# Patient Record
Sex: Male | Born: 1990 | Race: Black or African American | Hispanic: No | Marital: Single | Smoking: Never smoker
Health system: Southern US, Community
[De-identification: ages and names within clinical notes are randomized; demographics above are authoritative.]

## PROBLEM LIST (undated history)

## (undated) DIAGNOSIS — Z8669 Personal history of other diseases of the nervous system and sense organs: Secondary | ICD-10-CM

## (undated) DIAGNOSIS — F319 Bipolar disorder, unspecified: Secondary | ICD-10-CM

---
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
## 2016-03-30 ENCOUNTER — Emergency Department (HOSPITAL_COMMUNITY)
Admission: EM | Admit: 2016-03-30 | Discharge: 2016-04-03 | Disposition: A | Discharge status: Home / Self Care | Payer: 59 | Attending: Emergency Medicine | Admitting: Emergency Medicine

## 2016-03-30 ENCOUNTER — Encounter (HOSPITAL_COMMUNITY): Payer: Self-pay | Admitting: Emergency Medicine

## 2016-03-30 DIAGNOSIS — F061 Catatonic disorder due to known physiological condition: Secondary | ICD-10-CM | POA: Diagnosis present

## 2016-03-30 DIAGNOSIS — F3162 Bipolar disorder, current episode mixed, moderate: Secondary | ICD-10-CM | POA: Diagnosis not present

## 2016-03-30 DIAGNOSIS — F309 Manic episode, unspecified: Secondary | ICD-10-CM | POA: Diagnosis present

## 2016-03-30 DIAGNOSIS — F3160 Bipolar disorder, current episode mixed, unspecified: Secondary | ICD-10-CM | POA: Diagnosis present

## 2016-03-30 DIAGNOSIS — Z79899 Other long term (current) drug therapy: Secondary | ICD-10-CM | POA: Insufficient documentation

## 2016-03-30 DIAGNOSIS — F3113 Bipolar disorder, current episode manic without psychotic features, severe: Secondary | ICD-10-CM | POA: Diagnosis present

## 2016-03-30 DIAGNOSIS — Z888 Allergy status to other drugs, medicaments and biological substances status: Secondary | ICD-10-CM | POA: Diagnosis not present

## 2016-03-30 DIAGNOSIS — F29 Unspecified psychosis not due to a substance or known physiological condition: Secondary | ICD-10-CM | POA: Diagnosis not present

## 2016-03-30 HISTORY — DX: Bipolar disorder, unspecified: F31.9

## 2016-03-30 LAB — CBC
HEMATOCRIT: 43 % (ref 39.0–52.0)
HEMOGLOBIN: 14.7 g/dL (ref 13.0–17.0)
MCH: 26.3 pg (ref 26.0–34.0)
MCHC: 34.2 g/dL (ref 30.0–36.0)
MCV: 77.1 fL — AB (ref 78.0–100.0)
Platelets: 263 10*3/uL (ref 150–400)
RBC: 5.58 MIL/uL (ref 4.22–5.81)
RDW: 13 % (ref 11.5–15.5)
WBC: 14.1 10*3/uL — AB (ref 4.0–10.5)

## 2016-03-30 NOTE — ED Triage Notes (Signed)
Pt from home via police escort. Mother called [employer] for son's manic behavior that has increased over last several weeks.  Pt states that he hasn't been taking his meds like he should (lithium).  Denies any recent changes or stressors.  Pt denies SI/HI/AVH.  Hx bipolar d/o.

## 2016-03-30 NOTE — ED Notes (Signed)
Bed: [facility] Expected date:  Expected time:  Means of arrival:  Comments: 

## 2016-03-31 LAB — RAPID URINE DRUG SCREEN, HOSP PERFORMED
AMPHETAMINES: NOT DETECTED
BENZODIAZEPINES: NOT DETECTED
Barbiturates: NOT DETECTED
Cocaine: NOT DETECTED
Opiates: NOT DETECTED
TETRAHYDROCANNABINOL: NOT DETECTED

## 2016-03-31 LAB — COMPREHENSIVE METABOLIC PANEL
ALBUMIN: 4.8 g/dL (ref 3.5–5.0)
ALK PHOS: 61 U/L (ref 38–126)
ALT: 76 U/L — AB (ref 17–63)
AST: 49 U/L — ABNORMAL HIGH (ref 15–41)
Anion gap: 7 (ref 5–15)
BUN: 12 mg/dL (ref 6–20)
CALCIUM: 10 mg/dL (ref 8.9–10.3)
CO2: 26 mmol/L (ref 22–32)
CREATININE: 1.34 mg/dL — AB (ref 0.61–1.24)
Chloride: 101 mmol/L (ref 101–111)
GFR calc non Af Amer: >60 mL/min (ref >60)
GLUCOSE: 124 mg/dL — AB (ref 65–99)
Potassium: 3.5 mmol/L (ref 3.5–5.1)
SODIUM: 134 mmol/L — AB (ref 135–145)
Total Bilirubin: 0.7 mg/dL (ref 0.3–1.2)
Total Protein: 8.6 g/dL — ABNORMAL HIGH (ref 6.5–8.1)

## 2016-03-31 LAB — ETHANOL: Alcohol, Ethyl (B): <5 mg/dL (ref <5)

## 2016-03-31 LAB — SALICYLATE LEVEL: Salicylate Lvl: <7 mg/dL (ref 2.8–30.0)

## 2016-03-31 LAB — ACETAMINOPHEN LEVEL: Acetaminophen (Tylenol), Serum: <10 ug/mL — ABNORMAL LOW (ref 10–30)

## 2016-03-31 LAB — LITHIUM LEVEL: LITHIUM LVL: 0.88 mmol/L (ref 0.60–1.20)

## 2016-03-31 MED ORDER — DIPHENHYDRAMINE HCL 25 MG PO CAPS
50.0000 mg | ORAL_CAPSULE | Freq: Three times a day (TID) | ORAL | Status: DC | PRN
  Administered 2016-03-31: 50 mg via ORAL
  Filled 2016-03-31: qty 2

## 2016-03-31 MED ORDER — LORAZEPAM 1 MG PO TABS
2.0000 mg | ORAL_TABLET | Freq: Four times a day (QID) | ORAL | Status: DC | PRN
  Administered 2016-03-31: 2 mg via ORAL
  Filled 2016-03-31: qty 2

## 2016-03-31 MED ORDER — LITHIUM CARBONATE 300 MG PO CAPS
300.0000 mg | ORAL_CAPSULE | Freq: Two times a day (BID) | ORAL | Status: DC
  Administered 2016-03-31 – 2016-04-01 (×2): 300 mg via ORAL
  Filled 2016-03-31 (×3): qty 1

## 2016-03-31 MED ORDER — HALOPERIDOL LACTATE 5 MG/ML IJ SOLN
10.0000 mg | Freq: Three times a day (TID) | INTRAMUSCULAR | Status: DC | PRN
Start: 2016-03-31 — End: 2016-04-01

## 2016-03-31 MED ORDER — HALOPERIDOL 5 MG PO TABS
10.0000 mg | ORAL_TABLET | Freq: Three times a day (TID) | ORAL | Status: DC | PRN
  Administered 2016-03-31: 10 mg via ORAL
  Filled 2016-03-31: qty 2

## 2016-03-31 MED ORDER — LORAZEPAM 1 MG PO TABS
1.0000 mg | ORAL_TABLET | Freq: Two times a day (BID) | ORAL | Status: DC
  Administered 2016-03-31 – 2016-04-01 (×2): 1 mg via ORAL
  Filled 2016-03-31 (×3): qty 1

## 2016-03-31 MED ORDER — DIPHENHYDRAMINE HCL 50 MG/ML IJ SOLN: 50.0000 mg | Freq: Three times a day (TID) | INTRAMUSCULAR | Status: DC | PRN

## 2016-03-31 NOTE — ED Notes (Signed)
[facility] Expected date:  Expected time:  Means of arrival:  Comments: [place]

## 2016-03-31 NOTE — ED Notes (Addendum)
Li+ level drawn by lab and sent

## 2016-03-31 NOTE — BH Assessment (Signed)
Pt's mother has requested to speak with the pt's RN in order to ask medical questions and review his allergy list. Report given to Consuella LoseElaine, RN to contact the pt's mother to discuss medical concerns. Pt's mother requests that the pt [facility]. Assessor explained the process of inpt treatment and bed availability. Pt's mother verbalized understanding and requests to be contacted with any updates in regards to the pt's care.   Princess BruinsAquicha Duff, MSW, Theresia MajorsLCSWA

## 2016-03-31 NOTE — BH Assessment (Signed)
Tele Assessment Note   Brandon Fuentes is an 26 y.o. male who presents to the ED under IVC initiated by his mother. Pt was catatonic during the assessment and did not engage with this assessor. Pt was asked direct questions including name, DOB, reasons for coming to the hospital and the pt did not respond. Pt continued to stare blankly at the assessor.    Per IVC, the pt has been experiencing hallucinations, talking to God, and telling his mother that "god is ready for them to come home." Pt's mother reports she is "afraid to live with him" due to fear that he may try to harm her. Mom reports the pt has made threats to her saying "hit me, hit me, I don't want to hit a woman but hit me and I'll bust you in the face." Pt's mother reports she was afraid today because the pt "hugged me so tight and would not let me go and he was crying and told me he was taking me home."    Mom reports she noticed these changes in the pt in the last 6 weeks and states he has not seen his psychiatrist because he missed the last several appts. Pt's mother states he is taking his medication regularly but feels the medication is no longer effective and since he has not been to the psychiatrist she is unable to get it modified. Pt's mother reports the pt is short tempered and has been fired from multiple jobs for "going off." Mom reports the pt sometimes experiences delusions while he is watching TV and will think the character in the TV is a real person or is the pt's mother.    Per Nira ConnJason Berry, NP pt meets criteria for inpt treatment. TTS to seek placement. Consuella LoseElaine, RN notified of the recommended disposition.    Diagnosis: Schizophrenia; 295.90 (F20.9)  Past Medical History:  Past Medical History:  Diagnosis Date   Bipolar 1 disorder (HCC)     History reviewed. No pertinent surgical history.  Family History: History reviewed. No pertinent family history.  Social History:  reports that he has never smoked. He has  never used smokeless tobacco. He reports that he does not drink alcohol or use drugs.  Additional Social History:  Alcohol / Drug Use Pain Medications: See PTA meds  Prescriptions: See PTA meds  Over the Counter: See PTA meds  History of alcohol / drug use?: No history of alcohol / drug abuse  CIWA: CIWA-Ar BP: 142/94 Pulse Rate: 93 COWS:    PATIENT STRENGTHS: (choose at least two) Financial means Religious Affiliation Supportive family/friends  Allergies:  Allergies  Allergen Reactions   Depakote [Divalproex Sodium] Other (See Comments)    Sleepiness, anger   Zoloft [Sertraline Hcl]     anger    Home Medications:  (Not in a hospital admission)  OB/GYN Status:  No LMP for male patient.  General Assessment Data [facility] TTS Assessment: In system Is this a Tele or Face-to-Face Assessment?: Face-to-Face Is this an Initial Assessment or a Re-assessment for this encounter?: Initial Assessment Marital status: Single Is patient pregnant?: No Pregnancy Status: No Living Arrangements: Parent Can pt return to current living arrangement?: Yes Admission Status: Involuntary Is patient capable of signing voluntary admission?: No Referral Source: Self/Family/Friend [facility]     Crisis Care Plan Living Arrangements: Parent Name of Psychiatrist: Dr. Maggie FontMarion Wright Name of Therapist: none  Education Status Is patient currently in school?: No Highest grade of school  patient has completed: 12th  Risk to self with the past 6 months Suicidal Ideation: No Has patient been a risk to self within the past 6 months prior to admission? : No Suicidal Intent: No Has patient had any suicidal intent within the past 6 months prior to admission? : No Is patient at risk for suicide?: No Suicidal Plan?: No Has patient had any suicidal plan within the past 6 months prior to admission? : No Access to Means: No What has been your use of drugs/alcohol  within the last 12 months?: denies Previous Attempts/Gestures: No Triggers for Past Attempts: None known Intentional Self Injurious Behavior: None Family Suicide History: No Recent stressful life event(s): Other (Comment) (pt has been experiencing hallucinations ) Persecutory voices/beliefs?: No Depression: No Substance abuse history and/or treatment for substance abuse?: No Suicide prevention information given to non-admitted patients: Not applicable  Risk to Others within the past 6 months Homicidal Ideation: No Does patient have any lifetime risk of violence toward others beyond the six months prior to admission? : No Thoughts of Harm to Others: No-Not Currently Present/Within Last 6 Months (mom reports pt has made verbal threats in the past) Current Homicidal Intent: No Current Homicidal Plan: No Access to Homicidal Means: No History of harm to others?: No Assessment of Violence: None Noted Does patient have access to weapons?: No Criminal Charges Pending?: No Does patient have a court date: No Is patient on probation?: No  Psychosis Hallucinations: Auditory, Visual Delusions: Unspecified  Mental Status Report Appearance/Hygiene: Bizarre, In scrubs Eye Contact: Good Motor Activity: Other (Comment) (pt presented to be still in the bed and did not move ) Speech: Unable to assess (pt did not speak ) Level of Consciousness: Alert Mood: Other (Comment) (UTA) Affect: Unable to Assess Anxiety Level:  (UTA) Thought Processes: Unable to Assess Judgement: Unable to Assess Orientation: Not oriented Obsessive Compulsive Thoughts/Behaviors: Unable to Assess  Cognitive Functioning Concentration: Unable to Assess Memory: Unable to Assess IQ: Average Insight: Unable to Assess Impulse Control: Unable to Assess Appetite:  (UTA) Sleep: Unable to Assess Vegetative Symptoms: Unable to Assess  ADLScreening [facility] Assessment Services) Patient's cognitive ability adequate to safely  complete daily activities?: Yes Patient able to express need for assistance with ADLs?: No Independently performs ADLs?: Yes (appropriate for developmental age)  Prior Inpatient Therapy Prior Inpatient Therapy: Yes Prior Therapy Dates: 2011 Prior Therapy Facilty/Provider(s): [facility] Reason for Treatment: Bipolar  Prior Outpatient Therapy Prior Outpatient Therapy: Yes Prior Therapy Dates: 2017 Prior Therapy Facilty/Provider(s): Dr. Maggie Font Reason for Treatment: Med Management  Does patient have an ACCT team?: No Does patient have Intensive In-House Services?  : No Does patient have [facility] services? : No Does patient have P4CC services?: No  ADL Screening (condition at time of admission) Patient's cognitive ability adequate to safely complete daily activities?: Yes Is the patient deaf or have difficulty hearing?: No Does the patient have difficulty seeing, even when wearing glasses/contacts?: No Does the patient have difficulty concentrating, remembering, or making decisions?: Yes Patient able to express need for assistance with ADLs?: No Does the patient have difficulty dressing or bathing?: No Independently performs ADLs?: Yes (appropriate for developmental age) Does the patient have difficulty walking or climbing stairs?: No Weakness of Legs: None Weakness of Arms/Hands: None  Home Assistive Devices/Equipment Home Assistive Devices/Equipment: Eyeglasses    Abuse/Neglect Assessment (Assessment to be complete while patient is alone) Physical Abuse: Denies Verbal Abuse: Denies Sexual Abuse: Denies Exploitation of patient/patient's resources: Denies Self-Neglect: Denies  Advance Directives (For Healthcare) Does Patient Have a Medical Advance Directive?: No Would patient like information on creating a medical advance directive?: No - Patient declined    Additional Information 1:1 In Past 12 Months?: No CIRT Risk: No Elopement Risk: No Does patient have medical  clearance?: Yes     Disposition:  Disposition Initial Assessment Completed for this Encounter: Yes Disposition of Patient: Inpatient treatment program Type of inpatient treatment program: Adult (per Nira Conn, NP )  Karolee Ohs 03/31/2016 2:17 AM

## 2016-03-31 NOTE — ED Notes (Signed)
Pt appears to be responding to internal stimuli. He goes into other patient's rooms and when redirected he becomes limp and asks as though he cannot walk. The day room was opened for him and he has been going in there as an alternative. Pt's behavior is not predictable and he stands at the nurse station window staring frequently. He will not respond verbally.

## 2016-03-31 NOTE — ED Notes (Signed)
Tasha in phlebotomy notified of AM Li+ level needed.

## 2016-03-31 NOTE — ED Notes (Addendum)
Pt urinated on floor in front of nurse station, and gave no other interaction, his scrubs are dry.

## 2016-03-31 NOTE — ED Notes (Addendum)
Pt stating "In Jesus name, in Jesus name".    Pt moves slowly and is slow to respond to questions if a response is provided.  Pt has personal Bible @ BS.  Pt changed into paper scrubs, provided non-skid footwear & wanded by Security.

## 2016-03-31 NOTE — ED Provider Notes (Signed)
[facility] DE[facility] Provider Note   CSN: [id number] Arrival date & time: 03/30/16  2201  Level V caveat psychiatric complaint. [place] [facility] [place].  I attempted to call patient's home. No answer History   Chief Complaint Chief Complaint  Patient presents with  . Manic Behavior    HPI Brandon Fuentes is a 26 y.o. male.Police were called by patient's mother who states that patient has likely been off of his psychiatric medications and has been acting "manic" for several days. Patient is speaking constantly about the Shaune PollackLord,. Mother is concerned that he is manic. Patient vehemently denies that he wants to harm himself or others. He does not answer all questions.  HPI  Past Medical History:  Diagnosis Date  . Bipolar 1 disorder (HCC)     There are no active problems to display for this patient.   History reviewed. No pertinent surgical history.     Home Medications    Prior to Admission medications   Medication Sig Start Date End Date Taking? Authorizing Provider  lithium carbonate 300 MG capsule Take 600-900 mg by mouth 2 (two) times daily. 06/10/14  Yes Historical Provider, MD    Family History History reviewed. No pertinent family history.  Social History Social History  Substance Use Topics  . Smoking status: Never Smoker  . Smokeless tobacco: Never Used  . Alcohol use No     Allergies   Patient has no known allergies.   Review of Systems Review of Systems  Unable to perform ROS: Psychiatric disorder   Patient does not answer all questions  Physical Exam Updated Vital Signs BP 143/87 (BP Location: Right Arm)   Pulse 82   Temp 98.7 F (37.1 C) (Oral)   Resp 18   SpO2 98%   Physical Exam  Constitutional: He appears well-developed and well-nourished.  HENT:  Head: Normocephalic and atraumatic.  Eyes: Conjunctivae are normal. Pupils are equal, round, and reactive to light.  Neck: Neck supple. No  tracheal deviation present. No thyromegaly present.  Cardiovascular: Normal rate.   Pulmonary/Chest: Effort normal.  Abdominal: He exhibits no distension.  Obese  Musculoskeletal: Normal range of motion. He exhibits no edema.  Neurological: He is alert. Coordination normal.  Skin: Skin is warm and dry. No rash noted.  Psychiatric: He has a normal mood and affect.  Nursing note and vitals reviewed.    ED Treatments / Results  Labs (all labs ordered are listed, but only abnormal results are displayed) Labs Reviewed  CBC - Abnormal; Notable for the following:       Result Value   WBC 14.1 (*)    MCV 77.1 (*)    All other components within normal limits  COMPREHENSIVE METABOLIC PANEL  ETHANOL  SALICYLATE LEVEL  ACETAMINOPHEN LEVEL  RAPID URINE DRUG SCREEN, HOSP PERFORMED  LITHIUM LEVEL    EKG  EKG Interpretation None       Radiology No results found.  Procedures Procedures (including critical care time)  Medications Ordered in ED Medications - No data to display  Results for orders placed or performed during the hospital encounter of 03/30/16  Comprehensive metabolic panel  Result Value Ref Range   Sodium 134 (L) 135 - 145 mmol/L   Potassium 3.5 3.5 - 5.1 mmol/L   Chloride 101 101 - 111 mmol/L   CO2 26 22 - 32 mmol/L   Glucose, Bld 124 (H) 65 - 99 mg/dL   BUN 12 6 - 20 mg/dL  Creatinine, Ser 1.34 (H) 0.61 - 1.24 mg/dL   Calcium 81.1 8.9 - 91.4 mg/dL   Total Protein 8.6 (H) 6.5 - 8.1 g/dL   Albumin 4.8 3.5 - 5.0 g/dL   AST 49 (H) 15 - 41 U/L   ALT 76 (H) 17 - 63 U/L   Alkaline Phosphatase 61 38 - 126 U/L   Total Bilirubin 0.7 0.3 - 1.2 mg/dL   GFR calc non Af Amer >60 >60 mL/min   GFR calc Af Amer >60 >60 mL/min   Anion gap 7 5 - 15  Ethanol  Result Value Ref Range   Alcohol, Ethyl (B) <5 <5 mg/dL  Salicylate level  Result Value Ref Range   Salicylate Lvl <7.0 2.8 - 30.0 mg/dL  Acetaminophen level  Result Value Ref Range   Acetaminophen (Tylenol),  Serum <10 (L) 10 - 30 ug/mL  cbc  Result Value Ref Range   WBC 14.1 (H) 4.0 - 10.5 K/uL   RBC 5.58 4.22 - 5.81 MIL/uL   Hemoglobin 14.7 13.0 - 17.0 g/dL   HCT 78.2 95.6 - 21.3 %   MCV 77.1 (L) 78.0 - 100.0 fL   MCH 26.3 26.0 - 34.0 pg   MCHC 34.2 30.0 - 36.0 g/dL   RDW 08.6 57.8 - 46.9 %   Platelets 263 150 - 400 K/uL   No results found. Initial Impression / Assessment and Plan / ED Course  I have reviewed the triage vital signs and the nursing notes.  Pertinent labs & imaging results that were available during my care of the patient were reviewed by me and considered in my medical decision making (see chart for details).     Pt signed out to Dr. Donnald Garre at 1230 am  Final Clinical Impressions(s) / ED Diagnoses  Dx bipolar disorder Final diagnoses:  None    New Prescriptions New Prescriptions   No medications on file     Doug Sou, MD 03/31/16 [phone]

## 2016-03-31 NOTE — ED Notes (Signed)
Pt took his pills pleasantly and allowed this writer to look into his mouth. Is not speaking full sentences.

## 2016-03-31 NOTE — ED Notes (Signed)
SBAR Report received from previous nurse. Pt received calm and visible on unit. Pt gave no answers to assessment questions related to current SI/ HI, A/V H, depression, anxiety, or pain at this time, and appears otherwise stable and free of distress. Pt reminded of camera surveillance, q 15 min rounds, and rules of the milieu. Pt provided cup for urin, pt urinated in room (no rest room in pt room) and on floor. Will continue to assess.

## 2016-03-31 NOTE — ED Provider Notes (Signed)
Patient initially seen and evaluated by Dr. Ethelda ChickJacubowitz for manic behavior with aggression reported by his mother who filed an IVC petition. He is answering a few questions now, denies physical complaint, and reports he is spiritually in the [place]. He does not appear to be hallucinating. He will need TTS consultation to determine disposition. Psych orders placed.   Elpidio AnisShari Aitan Rossbach, PA-C 03/31/16 0139    Doug SouSam Jacubowitz, MD 04/02/16 1356

## 2016-03-31 NOTE — ED Notes (Signed)
Pt stated "I didn't go to repent.  He's here in my heart?"

## 2016-03-31 NOTE — Progress Notes (Signed)
CSW filed patient's examination paperwork into IVC logbook.    Sheriece Jefcoat, LCSWA [facility]  Clinical Social Worker [phone] 

## 2016-03-31 NOTE — ED Notes (Signed)
Pt used the restroom independently. He tires to talk to staff with few words, but is not intelligible.

## 2016-03-31 NOTE — BH Assessment (Signed)
Patient referred to following facilities for placement:  [facility] - [phone] [facility] - [phone] [facility] - [phone] [facility] - [phone] [facility] - [phone] [facility] - [phone]  Davina PokeJoVea Sten Dematteo, LCSW Therapeutic Triage Specialist [facility] 03/31/2016 12:26 PM

## 2016-04-01 DIAGNOSIS — F3160 Bipolar disorder, current episode mixed, unspecified: Secondary | ICD-10-CM | POA: Diagnosis not present

## 2016-04-01 DIAGNOSIS — F3113 Bipolar disorder, current episode manic without psychotic features, severe: Secondary | ICD-10-CM | POA: Diagnosis present

## 2016-04-01 DIAGNOSIS — F061 Catatonic disorder due to known physiological condition: Secondary | ICD-10-CM | POA: Diagnosis present

## 2016-04-01 DIAGNOSIS — F29 Unspecified psychosis not due to a substance or known physiological condition: Secondary | ICD-10-CM | POA: Diagnosis not present

## 2016-04-01 LAB — COMPREHENSIVE METABOLIC PANEL
ALBUMIN: 4.6 g/dL (ref 3.5–5.0)
ALK PHOS: 58 U/L (ref 38–126)
ALT: 77 U/L — AB (ref 17–63)
AST: 55 U/L — ABNORMAL HIGH (ref 15–41)
Anion gap: 10 (ref 5–15)
BUN: 10 mg/dL (ref 6–20)
CHLORIDE: 104 mmol/L (ref 101–111)
CO2: 23 mmol/L (ref 22–32)
CREATININE: 1.18 mg/dL (ref 0.61–1.24)
Calcium: 9.7 mg/dL (ref 8.9–10.3)
GFR calc non Af Amer: >60 mL/min (ref >60)
GLUCOSE: 120 mg/dL — AB (ref 65–99)
Potassium: 3.5 mmol/L (ref 3.5–5.1)
SODIUM: 137 mmol/L (ref 135–145)
Total Bilirubin: 1.1 mg/dL (ref 0.3–1.2)
Total Protein: 8 g/dL (ref 6.5–8.1)

## 2016-04-01 LAB — TSH: TSH: 2.02 u[IU]/mL (ref 0.350–4.500)

## 2016-04-01 MED ORDER — LORAZEPAM 1 MG PO TABS
2.0000 mg | ORAL_TABLET | Freq: Three times a day (TID) | ORAL | Status: DC
  Administered 2016-04-01 (×3): 2 mg via ORAL
  Filled 2016-04-01 (×3): qty 2

## 2016-04-01 MED ORDER — LITHIUM CARBONATE 300 MG PO CAPS
600.0000 mg | ORAL_CAPSULE | Freq: Two times a day (BID) | ORAL | Status: DC
  Administered 2016-04-01 – 2016-04-03 (×4): 600 mg via ORAL
  Filled 2016-04-01 (×4): qty 2

## 2016-04-01 NOTE — ED Notes (Signed)
Pt urinated in the bed; pt cleaned; bedding changed

## 2016-04-01 NOTE — ED Provider Notes (Signed)
By signing my name below, I, Soijett Blue, attest that this documentation has been prepared under the direction and in the presence of Anylah Scheib, MD. Electronically Signed: Soijett Blue, ED Scribe. 04/01/16. 12:53 AM.  Jeanene Erballed to evaluate pt via Charge Nurse, due to pt having unsteady gait and not speaking with staff. Staff state that the pt was given benadryl, haldol, and ativan injection during the dayshift and woke up with unsteady gait. Upon leaving the room, pt states "Ma'am, ma'am I'm up" and "wait, wait, wait" repeatedly. Night shift staff states that the pt was not given any medications tonight. Pt denies any other symptoms.   PE:  Cardiac: RRR  Pulmonology: Lungs clear bilatearlly  Eyes: Eyes tracking movement. Pupillary response  Musculoskeletal: Moving purposely in all 4 extremities.  Speaking in complete sentences FROM x4 with good strength  This is a 26 y.o. -year-old male presents with  Psych issues.  And is speaking well and moving all 4 extremities.  This is not a CVA it is NOT neuroleptic malignant syndrome it is a medication effect and behavioral.  The patient is nontoxic-appearing on exam and vital signs are within normal limits.      I personally performed the services described in this documentation, which was scribed in my presence. The recorded information has been reviewed and is accurate.       Cy BlamerApril Cattleya Dobratz, MD 04/01/16 [phone]

## 2016-04-01 NOTE — ED Notes (Addendum)
Assumed care of patient from Brandon Fuentes, [place]. Pt resting quietly in bed. No distress. Patient not in restraints at this time. Sitter at bedside.

## 2016-04-01 NOTE — ED Notes (Signed)
Pt sitting at bedside and had an incontinence episode of urine. 3 staff members assisted with care, complete bed change. Provided patient with urinal and instructed him to use urinal or communicate with staff when he needs assistance to bathroom, patient remains nonverbal. While providing care patient decided to act like he was unable to dress himself, required max assist from staff, although during previous encounters this morning, patient exhibited purposeful, strong movement of all extremities. Pt remains non verbal, will make eye contact with staff, nurse tech asked patient to move himself up in bed and he was able to follow commands. Patient able to take his morning medications.

## 2016-04-01 NOTE — ED Notes (Signed)
No further instructions from MD Palumbo regarding pt's treatment or condition

## 2016-04-01 NOTE — ED Notes (Signed)
Report given to [place], [place] [facility].

## 2016-04-01 NOTE — Consult Note (Signed)
[place] Consult   Reason for Consult:  Hallucinating Referring Physician:  EDP Patient Identification: Brandon Fuentes MRN:  [id number] Principal Diagnosis: Psychosis Diagnosis:   Patient Active Problem List   Diagnosis Date Noted  . Bipolar disorder ([place]) [F31.9] 04/01/2016  . 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 Psychosis [F29] 04/01/2016    Total Time spent with patient: 45 minutes  Subjective:   Brandon Fuentes is a 26 y.o. male patient presents to [facility] under IVC by his mother with complaints of Hallucination.  HPI:  Brandon Fuentes 26 y.o. male  patient seen by Dr. Modesta Messing and this provider.  Chart reviewed 04/01/16.   On evaluation:  Brandon Fuentes is in bed.  Patient will not respond verbally but did follow some simple commands:  When asked to raise his right arm and then the left he follow instruction correctly.  When asked a question he did not respond just looked at interviewer.   Patient is not good historian.   During TTS initial assessment Mother had states that patient was having hallucinations.  Mother of patient reported she is "afraid to live with him" due to fear that he may try to harm her.   Past Psychiatric History: Bipolar disorder mixed type; prior psychiatric hospitalization  Risk to Self: Suicidal Ideation: No Suicidal Intent: No Is patient at risk for suicide?: No Suicidal Plan?: No Access to Means: No What has been your use of drugs/alcohol within the last 12 months?: denies Triggers for Past Attempts: None known Intentional Self Injurious Behavior: None Risk to Others: Homicidal Ideation: No Thoughts of Harm to Others: No-Not Currently Present/Within Last 6 Months (mom reports pt has made verbal threats in the past) Current Homicidal Intent: No Current Homicidal Plan: No Access to Homicidal Means: No History of harm to others?: No Assessment of Violence: None Noted Does patient have access to weapons?: No Criminal Charges Pending?:  No Does patient have a court date: No Prior Inpatient Therapy: Prior Inpatient Therapy: Yes Prior Therapy Dates: 2011 Prior Therapy Facilty/Provider(s): [facility] Reason for Treatment: Bipolar Prior Outpatient Therapy: Prior Outpatient Therapy: Yes Prior Therapy Dates: 2017 Prior Therapy Facilty/Provider(s): Dr. Catha Nottingham Reason for Treatment: Med Management  Does patient have an ACCT team?: No Does patient have Intensive In-House Services?  : No Does patient have [facility] services? : No Does patient have P4CC services?: No  Past Medical History:  Past Medical History:  Diagnosis Date  . Bipolar 1 disorder ([place])    History reviewed. No pertinent surgical history. Family History: History reviewed. No pertinent family history. Family Psychiatric  History: Unaware Social History:  History  Alcohol Use No     History  Drug Use No    Social History   Social History  . Marital status: Single    Spouse name: N/A  . Number of children: N/A  . Years of education: N/A   Social History Main Topics  . Smoking status: Never Smoker  . Smokeless tobacco: Never Used  . Alcohol use No  . Drug use: No  . Sexual activity: Not Asked   Other Topics Concern  . None   Social History Narrative  . None   Additional Social History:    Allergies:   Allergies  Allergen Reactions  . Depakote [Divalproex Sodium] Other (See Comments)    Sleepiness, anger  . Zoloft [Sertraline Hcl]     anger    Labs:  Results for orders placed or performed during the hospital encounter of 03/30/16 (from the  past 48 hour(s))  Comprehensive metabolic panel     Status: Abnormal   Collection Time: 03/30/16 11:30 PM  Result Value Ref Range   Sodium 134 (L) 135 - 145 mmol/L   Potassium 3.5 3.5 - 5.1 mmol/L   Chloride 101 101 - 111 mmol/L   CO2 26 22 - 32 mmol/L   Glucose, Bld 124 (H) 65 - 99 mg/dL   BUN 12 6 - 20 mg/dL   Creatinine, Ser 1.34 (H) 0.61 - 1.24 mg/dL   Calcium 10.0 8.9 - 10.3 mg/dL    Total Protein 8.6 (H) 6.5 - 8.1 g/dL   Albumin 4.8 3.5 - 5.0 g/dL   AST 49 (H) 15 - 41 U/L   ALT 76 (H) 17 - 63 U/L   Alkaline Phosphatase 61 38 - 126 U/L   Total Bilirubin 0.7 0.3 - 1.2 mg/dL   GFR calc non Af Amer >60 >60 mL/min   GFR calc Af Amer >60 >60 mL/min    Comment: (NOTE) The eGFR has been calculated using the CKD EPI equation. This calculation has not been validated in all clinical situations. eGFR's persistently <60 mL/min signify possible Chronic Kidney Disease.    Anion gap 7 5 - 15  Ethanol     Status: None   Collection Time: 03/30/16 11:30 PM  Result Value Ref Range   Alcohol, Ethyl (B) <5 <5 mg/dL    Comment:        LOWEST DETECTABLE LIMIT FOR SERUM ALCOHOL IS 5 mg/dL FOR MEDICAL PURPOSES ONLY   Salicylate level     Status: None   Collection Time: 03/30/16 11:30 PM  Result Value Ref Range   Salicylate Lvl <7.6 2.8 - 30.0 mg/dL  Acetaminophen level     Status: Abnormal   Collection Time: 03/30/16 11:30 PM  Result Value Ref Range   Acetaminophen (Tylenol), Serum <10 (L) 10 - 30 ug/mL    Comment:        THERAPEUTIC CONCENTRATIONS VARY SIGNIFICANTLY. A RANGE OF 10-30 ug/mL MAY BE AN EFFECTIVE CONCENTRATION FOR MANY PATIENTS. HOWEVER, SOME ARE BEST TREATED AT CONCENTRATIONS OUTSIDE THIS RANGE. ACETAMINOPHEN CONCENTRATIONS >150 ug/mL AT 4 HOURS AFTER INGESTION AND >50 ug/mL AT 12 HOURS AFTER INGESTION ARE OFTEN ASSOCIATED WITH TOXIC REACTIONS.   cbc     Status: Abnormal   Collection Time: 03/30/16 11:30 PM  Result Value Ref Range   WBC 14.1 (H) 4.0 - 10.5 K/uL   RBC 5.58 4.22 - 5.81 MIL/uL   Hemoglobin 14.7 13.0 - 17.0 g/dL   HCT 43.0 39.0 - 52.0 %   MCV 77.1 (L) 78.0 - 100.0 fL   MCH 26.3 26.0 - 34.0 pg   MCHC 34.2 30.0 - 36.0 g/dL   RDW 13.0 11.5 - 15.5 %   Platelets 263 150 - 400 K/uL  Lithium level     Status: None   Collection Time: 03/31/16  6:37 AM  Result Value Ref Range   Lithium Lvl 0.88 0.60 - 1.20 mmol/L  TSH     Status: None    Collection Time: 04/01/16  7:48 AM  Result Value Ref Range   TSH 2.020 0.350 - 4.500 uIU/mL    Comment: Performed by a 3rd Generation assay with a functional sensitivity of <=0.01 uIU/mL.  Comprehensive metabolic panel     Status: Abnormal   Collection Time: 04/01/16  7:48 AM  Result Value Ref Range   Sodium 137 135 - 145 mmol/L   Potassium 3.5 3.5 - 5.1 mmol/L  Chloride 104 101 - 111 mmol/L   CO2 23 22 - 32 mmol/L   Glucose, Bld 120 (H) 65 - 99 mg/dL   BUN 10 6 - 20 mg/dL   Creatinine, Ser 1.18 0.61 - 1.24 mg/dL   Calcium 9.7 8.9 - 10.3 mg/dL   Total Protein 8.0 6.5 - 8.1 g/dL   Albumin 4.6 3.5 - 5.0 g/dL   AST 55 (H) 15 - 41 U/L   ALT 77 (H) 17 - 63 U/L   Alkaline Phosphatase 58 38 - 126 U/L   Total Bilirubin 1.1 0.3 - 1.2 mg/dL   GFR calc non Af Amer >60 >60 mL/min   GFR calc Af Amer >60 >60 mL/min    Comment: (NOTE) The eGFR has been calculated using the CKD EPI equation. This calculation has not been validated in all clinical situations. eGFR's persistently <60 mL/min signify possible Chronic Kidney Disease.    Anion gap 10 5 - 15    Current Facility-Administered Medications  Medication Dose Route Frequency Provider Last Rate Last Dose  . lithium carbonate capsule 600 mg  600 mg Oral BID Norman Clay, MD      . LORazepam (ATIVAN) tablet 2 mg  2 mg Oral Q6H PRN Ethelene Hal, NP   2 mg at 03/31/16 1806  . LORazepam (ATIVAN) tablet 2 mg  2 mg Oral TID Norman Clay, MD   2 mg at 04/01/16 1245   Current Outpatient Prescriptions  Medication Sig Dispense Refill  . lithium carbonate 300 MG capsule Take 600-900 mg by mouth 2 (two) times daily.      Musculoskeletal: Strength & Muscle Tone: within normal limits Gait & Station: normal Patient leans: N/A  Psychiatric Specialty Exam: Physical Exam  Eyes: Pupils are equal, round, and reactive to light.  Neck: Normal range of motion.  Respiratory: Effort normal.    Review of Systems  Unable to perform ROS: Acuity  of condition    Blood pressure (!) 179/110, pulse 85, temperature 99 F (37.2 C), temperature source Oral, resp. rate (!) 28, SpO2 99 %.There is no height or weight on file to calculate BMI.  General Appearance: Fairly Groomed  Eye Contact:  Good  Speech:  Non verbal  Volume:  Non verbal  Mood:  Depressed  Affect:  Depressed and Flat  Thought Process:  Linear  Orientation:  Other:  Unable to determine at this time.  Patient is non verbal  Thought Content:  Other:  Unable to determine at this time.  Patient is non verbal  Suicidal Thoughts:  Other:  Unable to determine at this time.  Patient is non verbal  Homicidal Thoughts:  Other:  Unable to determine at this time.  Patient is non verbal  Memory:  Other:  Unable to determine at this time.  Patient is non verbal  Judgement:  Impaired  Insight:  Other:  Unable to determine at this time.  Patient is non verbal  Psychomotor Activity:  Decreased  Concentration:  Other:  Unable to determine at this time.  Patient is non verbal  Recall:  Other:  Unable to determine at this time.  Patient is non verbal  Fund of Knowledge:  Other:  Unable to determine at this time.  Patient is non verbal  Language:  Other:  Unable to determine at this time.  Patient is non verbal  Akathisia:  Yes  Handed:  Right  AIMS (if indicated):     Assets:  Housing Social Support  ADL's:  Impaired  Cognition:  Impaired,  Moderate  Sleep:        Treatment Plan Summary: Daily contact with patient to assess and evaluate symptoms and progress in treatment and Medication management Changes to medications Ativan 2 mg Tid and Ativan 2 mg Q 6 hr prn Lithium increased to 600 mg daily  Disposition: Recommend psychiatric Inpatient admission when medically cleared.  Rankin, Shuvon, NP 04/01/2016 12:50 PM

## 2016-04-01 NOTE — ED Notes (Signed)
ED Provider at bedside. 

## 2016-04-01 NOTE — ED Notes (Signed)
Attempted to get rectal temperature on pt; pt fought back against staff; unable to obtain rectal temp; MD informed

## 2016-04-01 NOTE — BH Assessment (Signed)
Patients mother Jake SharkSandra Bacigalupi called an asked for updates on patient status. Patients mother was informed that due to HIPAA this writer could not confirm or deny that the patient is here. This Clinical research associatewriter informed patients mother that she could leave her name and telephone number and if the patient is here the information would be given to the the patient who could choose to call her back.  Patients mothers number is [phone].  Information given to patient and patients nurse.   Brandon PokeJoVea Melanye Hiraldo, LCSW Therapeutic Triage Specialist [facility] 04/01/2016 8:33 AM

## 2016-04-01 NOTE — ED Notes (Signed)
Report to Jon, RN, to assume care of patient at this time.  

## 2016-04-01 NOTE — ED Provider Notes (Signed)
4:50 PM I was asked to see the patient for trend of increasing temperature respirator rate and blood pressure.  Nursing states that the patient has been fairly nonverbal and appears catatonic.  On arrival in room patient is alert, but watches examiner, and other people in the room.  When I asked him to speak he did not.  When I told them that I would leave to come back uttered several words that were partially intelligible.  It sounded like he was saying "I am lying".  Patient could not speak any clear or to explain himself.  I encouraged him to eat and drink, and talk to nursing staff and that I would check on him in a little while.  Relative to concern for neuroleptic malignant syndrome; patient is not being treated with dopamine antagonist medication.  No other recent changes in medications have been reported.    Medications  LORazepam (ATIVAN) tablet 2 mg (2 mg Oral Given 03/31/16 1806)  LORazepam (ATIVAN) tablet 2 mg (2 mg Oral Given 04/01/16 1245)  lithium carbonate capsule 600 mg (not administered)    Patient Vitals for the past 24 hrs:  BP Temp Temp src Pulse Resp SpO2  04/01/16 1620 155/99 99.4 F (37.4 C) Oral 105 (!) 28 98 %  04/01/16 1208 (!) 179/110 99 F (37.2 C) Oral 85 (!) 28 99 %  04/01/16 0622 157/92 98.6 F (37 C) Oral 107 20 99 %  04/01/16 0144 - (!) 96.8 F (36 C) Rectal - - -  04/01/16 0125 139/88 98.5 F (36.9 C) Oral 81 - 100 %  04/01/16 0039 147/92 - - 86 - 100 %  03/31/16 1751 (!) 153/102 98.4 F (36.9 C) Oral 103 18 100 %        Mancel BaleElliott Tu Shimmel, MD 04/02/16 [phone]

## 2016-04-01 NOTE — ED Notes (Signed)
Had Dr Nicanor AlconPalumbo come to see pt due to unsteady gait noted not talking pt moves all extremities and talks sometimes.

## 2016-04-01 NOTE — ED Notes (Signed)
MD at bedside. 

## 2016-04-01 NOTE — ED Notes (Signed)
Patient refused HS medications, spitting them into cup multiple times. Pt nonverbal while RN in room, but as this Clinical research associatewriter began to walk away from patient, he immediately opened his eyes, appeared more alert and stated "No, I'll take it, I'll take it". But as this Clinical research associatewriter approached the patient again, he began to close his eyes and began to sway back and forth while sitting on the bed. Pt then ambulated in the halls and closed his eyes, as he began to lean backwards multiple times. Staff having to hold onto patient to keep him from falling. Patient needing staff at his side to ambulate and redirect him. Pt unwilling to follow directions and continued to walk with eyes closed. MHT remained at patient's side for safety. Joann, AC notified of high fall risk and agreed with moving pt to TCU. ED charge nurse Arlys JohnBrian notified and instructed writer to move [facility]. Patient unsteady and assisted to a seated position in chair in the hallway to avoid fall. Pt needing several staff to assist him into his room, where he needed assistance and direction to get into his bed.

## 2016-04-01 NOTE — ED Notes (Signed)
Attempted to have patient stand at bedside, his gait is very unsteady, unable to walk, assisted back to bed by 2 staff members.

## 2016-04-01 NOTE — ED Notes (Addendum)
Dr. Effie ShyWentz notified of gradual change in vital sign trend - current vitals:  Vital Signs - Temp: 99.4 F (37.4 C) Temp Source: Oral; Pulse Rate: 105 Pulse Rate Source: Dinamap; Resp: 28; BP: 155/99 BP Location: Left Arm BP Method: Automatic Patient Position (if appropriate): Lying: SpO2: 98 % O2 Device: Room Air

## 2016-04-02 DIAGNOSIS — F061 Catatonic disorder due to known physiological condition: Secondary | ICD-10-CM | POA: Diagnosis not present

## 2016-04-02 DIAGNOSIS — Z888 Allergy status to other drugs, medicaments and biological substances status: Secondary | ICD-10-CM | POA: Diagnosis not present

## 2016-04-02 DIAGNOSIS — Z79899 Other long term (current) drug therapy: Secondary | ICD-10-CM

## 2016-04-02 DIAGNOSIS — F29 Unspecified psychosis not due to a substance or known physiological condition: Secondary | ICD-10-CM | POA: Diagnosis not present

## 2016-04-02 DIAGNOSIS — F3160 Bipolar disorder, current episode mixed, unspecified: Secondary | ICD-10-CM | POA: Diagnosis not present

## 2016-04-02 NOTE — ED Notes (Signed)
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

## 2016-04-02 NOTE — Consult Note (Signed)
[place] [facility]iat[facility] Consult   Reason for Consult:  psychosis Referring Physician:  EDP Patient Identification: Brandon Fuentes MRN:  [id number] Principal Diagnosis: Bipolar affective disorder, current episode mixed [facility]) Diagnosis:   Patient Active Problem List   Diagnosis Date Noted  . Bipolar affective disorder, current episode mixed ([place]) [F31.60]     Priority: High  . Bipolar disorder ([place]) [F31.9] 04/01/2016  . 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 Psychosis [F29] 04/01/2016    Total Time spent with patient: 30 minutes  Subjective:   Brandon Fuentes is a 26 y.o. male patient admitted with psychosis.  HPI:  26 yo male who presented to the ED with delusions.  He was started on medications this weekend and has improved greatly.  He will be monitored for one more day while searching for an inpatient bed.  However, if he remains stable, he will be discharged tomorrow.  No suicidal/homicidal ideations, hallucinations, and alcohol/drug abuse at this time.  Past Psychiatric History: bipolar disorder  Risk to Self: Suicidal Ideation: No Suicidal Intent: No Is patient at risk for suicide?: No Suicidal Plan?: No Access to Means: No What has been your use of drugs/alcohol within the last 12 months?: denies Triggers for Past Attempts: None known Intentional Self Injurious Behavior: None Risk to Others: Homicidal Ideation: No Thoughts of Harm to Others: No-Not Currently Present/Within Last 6 Months (mom reports pt has made verbal threats in the past) Current Homicidal Intent: No Current Homicidal Plan: No Access to Homicidal Means: No History of harm to others?: No Assessment of Violence: None Noted Does patient have access to weapons?: No Criminal Charges Pending?: No Does patient have a court date: No Prior Inpatient Therapy: Prior Inpatient Therapy: Yes Prior Therapy Dates: 2011 Prior Therapy Facilty/Provider(s): [facility] Reason for Treatment: Bipolar Prior Outpatient  Therapy: Prior Outpatient Therapy: Yes Prior Therapy Dates: 2017 Prior Therapy Facilty/Provider(s): Dr. Catha Nottingham Reason for Treatment: Med Management  Does patient have an ACCT team?: No Does patient have Intensive In-House Services?  : No Does patient have [facility] services? : No Does patient have P4CC services?: No  Past Medical History:  Past Medical History:  Diagnosis Date  . Bipolar 1 disorder ([place])    History reviewed. No pertinent surgical history. Family History: History reviewed. No pertinent family history. Family Psychiatric  History: none Social History:  History  Alcohol Use No     History  Drug Use No    Social History   Social History  . Marital status: Single    Spouse name: N/A  . Number of children: N/A  . Years of education: N/A   Social History Main Topics  . Smoking status: Never Smoker  . Smokeless tobacco: Never Used  . Alcohol use No  . Drug use: No  . Sexual activity: Not Asked   Other Topics Concern  . None   Social History Narrative  . None   Additional Social History:    Allergies:   Allergies  Allergen Reactions  . Depakote [Divalproex Sodium] Other (See Comments)    Sleepiness, anger  . Zoloft [Sertraline Hcl]     anger    Labs:  Results for orders placed or performed during the hospital encounter of 03/30/16 (from the past 48 hour(s))  TSH     Status: None   Collection Time: 04/01/16  7:48 AM  Result Value Ref Range   TSH 2.020 0.350 - 4.500 uIU/mL    Comment: Performed by a 3rd Generation assay with a functional sensitivity  of <=0.01 uIU/mL.  Comprehensive metabolic panel     Status: Abnormal   Collection Time: 04/01/16  7:48 AM  Result Value Ref Range   Sodium 137 135 - 145 mmol/L   Potassium 3.5 3.5 - 5.1 mmol/L   Chloride 104 101 - 111 mmol/L   CO2 23 22 - 32 mmol/L   Glucose, Bld 120 (H) 65 - 99 mg/dL   BUN 10 6 - 20 mg/dL   Creatinine, Ser 1.18 0.61 - 1.24 mg/dL   Calcium 9.7 8.9 - 10.3 mg/dL   Total  Protein 8.0 6.5 - 8.1 g/dL   Albumin 4.6 3.5 - 5.0 g/dL   AST 55 (H) 15 - 41 U/L   ALT 77 (H) 17 - 63 U/L   Alkaline Phosphatase 58 38 - 126 U/L   Total Bilirubin 1.1 0.3 - 1.2 mg/dL   GFR calc non Af Amer >60 >60 mL/min   GFR calc Af Amer >60 >60 mL/min    Comment: (NOTE) The eGFR has been calculated using the CKD EPI equation. This calculation has not been validated in all clinical situations. eGFR's persistently <60 mL/min signify possible Chronic Kidney Disease.    Anion gap 10 5 - 15    Current Facility-Administered Medications  Medication Dose Route Frequency Provider Last Rate Last Dose  . lithium carbonate capsule 600 mg  600 mg Oral BID Norman Clay, MD   600 mg at 04/02/16 9826   Current Outpatient Prescriptions  Medication Sig Dispense Refill  . lithium carbonate 300 MG capsule Take 600-900 mg by mouth 2 (two) times daily.      Musculoskeletal: Strength & Muscle Tone: within normal limits Gait & Station: normal Patient leans: N/A  Psychiatric Specialty Exam: Physical Exam  Constitutional: He is oriented to person, place, and time. He appears well-developed and well-nourished.  HENT:  Head: Normocephalic.  Neck: Normal range of motion.  Respiratory: Effort normal.  Musculoskeletal: Normal range of motion.  Neurological: He is alert and oriented to person, place, and time.  Psychiatric: His speech is normal and behavior is normal. Judgment and thought content normal. Cognition and memory are normal. He exhibits a depressed mood.    Review of Systems  Psychiatric/Behavioral: Positive for depression.  All other systems reviewed and are negative.   Blood pressure 149/94, pulse 82, temperature 98.5 F (36.9 C), temperature source Oral, resp. rate 20, SpO2 96 %.There is no height or weight on file to calculate BMI.  General Appearance: Casual  Eye Contact:  Good  Speech:  Normal Rate  Volume:  Normal  Mood:  Depressed  Affect:  Congruent  Thought Process:   Descriptions of Associations: Intact  Orientation:  Full (Time, Place, and Person)  Thought Content:  WDL and Logical  Suicidal Thoughts:  No  Homicidal Thoughts:  No  Memory:  Immediate;   Good Recent;   Good Remote;   Good  Judgement:  Fair  Insight:  Fair  Psychomotor Activity:  Decreased  Concentration:  Concentration: Fair and Attention Span: Fair  Recall:  [employer] of Knowledge:  Good  Language:  Fair  Akathisia:  No  Handed:  Right  AIMS (if indicated):     Assets:  Leisure Time Physical Health Resilience Social Support  ADL's:  Intact  Cognition:  WNL  Sleep:        Treatment Plan Summary: Daily contact with patient to assess and evaluate symptoms and progress in treatment, Medication management and Plan bipolar affective disorder, depressed, moderate:  -  Crisis stabilization -Medication management:  Continue Lithium 600 mg BID for mood stabilization -Individual counseling  Disposition: Supportive therapy provided about ongoing stressors.  Waylan Boga, NP 04/02/2016 3:55 PM  Patient seen face-to-face for psychiatric evaluation, chart reviewed and case discussed with the physician extender and developed treatment plan. Reviewed the information documented and agree with the treatment plan. Corena Pilgrim, MD

## 2016-04-02 NOTE — ED Notes (Signed)
Pt discharged ambulatory with [place] driver.  All belongings were sent with pt. 

## 2016-04-02 NOTE — Progress Notes (Signed)
04/02/16  1355  Since mom has left patient is standing in the door way of room 33 Signing "God has smiled on me" full lyrics.

## 2016-04-02 NOTE — ED Notes (Signed)
Unsteady gait reported ( yellow arm band placed)

## 2016-04-02 NOTE — ED Notes (Signed)
Discharge note charted in error

## 2016-04-02 NOTE — ED Notes (Signed)
Pt arrived to room 37 singing religious songs very loudly.  He will not participate in an assessment, only sings.  He appears happy.  15 minute checks and video monitoring in place.

## 2016-04-02 NOTE — ED Notes (Signed)
Pt awake, alert & responsive, no distress noted, calm & cooperative.  Watching TV at present,  Monitoring for safety, Q 15 min checks in effect. 

## 2016-04-02 NOTE — ED Notes (Signed)
Pt bedding checked and ADL's performed

## 2016-04-03 MED ORDER — HYDROXYZINE HCL 25 MG PO TABS: 25.0000 mg | ORAL_TABLET | Freq: Four times a day (QID) | ORAL | Status: DC | PRN

## 2016-04-03 MED ORDER — LITHIUM CARBONATE 600 MG PO CAPS
600.0000 mg | ORAL_CAPSULE | Freq: Two times a day (BID) | ORAL | 0 refills | Status: DC
Start: 2016-04-03 — End: 2016-04-16

## 2016-04-03 MED ORDER — HYDROXYZINE HCL 25 MG PO TABS: 25.0000 mg | ORAL_TABLET | Freq: Four times a day (QID) | ORAL | 0 refills | Status: DC | PRN

## 2016-04-03 NOTE — Discharge Instructions (Signed)
For your ongoing behavioral health needs, you are advised to follow up with [facility]  You have an appointment scheduled with Dr Rene KocherEksir on Monday, 04/30/2016 at 1:00 pm:       [facility]      [address]      [place], [place] [address]      [phone]

## 2016-04-03 NOTE — Consult Note (Signed)
[facility]   Reason for Consult:  psychosis Referring Physician:  EDP Patient Identification: Brandon Fuentes MRN:  [id number] Principal Diagnosis: Bipolar affective disorder, current episode mixed [facility](HCC) Diagnosis:   Patient Active Problem List   Diagnosis Date Noted  . Bipolar affective disorder, current episode mixed (HCC) [F31.60]     Priority: High  . Bipolar disorder (HCC) [F31.9] 04/01/2016  . 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 Psychosis [F29] 04/01/2016    Total Time spent with patient: 30 minutes  Subjective:   Brandon Fuentes is a 26 y.o. male patient has stabilized.   HPI:  26 yo male who presented to the ED with delusions.  He was started on medications this weekend and has improved greatly.  He has stabilized, walking around the unit, conversing.  Brandon Fuentes lives with his mother, no suicidal/homicidal ideations, hallucinations, and alcohol/drug withdrawal.  Past Psychiatric History: bipolar disorder  Risk to Self: Suicidal Ideation: No Suicidal Intent: No Is patient at risk for suicide?: No Suicidal Plan?: No Access to Means: No What has been your use of drugs/alcohol within the last 12 months?: denies Triggers for Past Attempts: None known Intentional Self Injurious Behavior: None Risk to Others: Homicidal Ideation: No Thoughts of Harm to Others: No-Not Currently Present/Within Last 6 Months (mom reports pt has made verbal threats in the past) Current Homicidal Intent: No Current Homicidal Plan: No Access to Homicidal Means: No History of harm to others?: No Assessment of Violence: None Noted Does patient have access to weapons?: No Criminal Charges Pending?: No Does patient have a court date: No Prior Inpatient Therapy: Prior Inpatient Therapy: Yes Prior Therapy Dates: 2011 [facility] Reason for Treatment: Bipolar Prior Outpatient Therapy: Prior Outpatient Therapy: Yes Prior Therapy Dates: 2017 Prior  Therapy Facilty/Provider(s): Dr. Maggie FontMarion Wright Reason for Treatment: Med Management  Does patient have an ACCT team?: No Does patient have Intensive In-House Services?  : No Does patient have [facility] services? : No Does patient have P4CC services?: No  Past Medical History:  Past Medical History:  Diagnosis Date  . Bipolar 1 disorder (HCC)    History reviewed. No pertinent surgical history. Family History: History reviewed. No pertinent family history. Family Psychiatric  History: none Social History:  History  Alcohol Use No     History  Drug Use No    Social History   Social History  . Marital status: Single    Spouse name: N/A  . Number of children: N/A  . Years of education: N/A   Social History Main Topics  . Smoking status: Never Smoker  . Smokeless tobacco: Never Used  . Alcohol use No  . Drug use: No  . Sexual activity: Not Asked   Other Topics Concern  . None   Social History Narrative  . None   Additional Social History:    Allergies:   Allergies  Allergen Reactions  . Depakote [Divalproex Sodium] Other (See Comments)    Sleepiness, anger  . Zoloft [Sertraline Hcl]     anger    Labs:  No results found for this or any previous visit (from the past 48 hour(s)).  Current Facility-Administered Medications  Medication Dose Route Frequency Provider Last Rate Last Dose  . lithium carbonate capsule 600 mg  600 mg Oral BID Neysa Hottereina Hisada, MD   600 mg at 04/02/16 2204   Current Outpatient Prescriptions  Medication Sig Dispense Refill  . lithium carbonate 300 MG capsule Take 600-900 mg by mouth 2 (two) times  daily.      Musculoskeletal: Strength & Muscle Tone: within normal limits Gait & Station: normal Patient leans: N/A  Psychiatric Specialty Exam: Physical Exam  Constitutional: He is oriented to person, place, and time. He appears well-developed and well-nourished.  HENT:  Head: Normocephalic.  Neck: Normal range of motion.  Respiratory:  Effort normal.  Musculoskeletal: Normal range of motion.  Neurological: He is alert and oriented to person, place, and time.  Psychiatric: His speech is normal and behavior is normal. Judgment and thought content normal. Cognition and memory are normal. He exhibits a depressed mood.    Review of Systems  Psychiatric/Behavioral: Positive for depression.  All other systems reviewed and are negative.   Blood pressure 141/90, pulse 94, temperature 98.1 F (36.7 C), temperature source Oral, resp. rate 16, SpO2 100 %.There is no height or weight on file to calculate BMI.  General Appearance: Casual  Eye Contact:  Good  Speech:  Normal Rate  Volume:  Normal  Mood:  Depressed, mild  Affect:  Congruent  Thought Process:  Descriptions of Associations: Intact  Orientation:  Full (Time, Place, and Person)  Thought Content:  WDL and Logical  Suicidal Thoughts:  No  Homicidal Thoughts:  No  Memory:  Immediate;   Good Recent;   Good Remote;   Good  Judgement:  Fair  Insight:  Fair  Psychomotor Activity:  Normal  Concentration:  Concentration: Fair and Attention Span: Fair  Recall:  Good  Fund of Knowledge:  Fair  Language:  Good  Akathisia:  No  Handed:  Right  AIMS (if indicated):     Assets:  Leisure Time Physical Health Resilience Social Support  ADL's:  Intact  Cognition:  WNL  Sleep:        Treatment Plan Summary: Daily contact with patient to assess and evaluate symptoms and progress in treatment, Medication management and Plan bipolar affective disorder, depressed, moderate:  -Crisis stabilization -Medication management:  Continue Lithium 600 mg BID for mood stabilization, start Vistaril 25 mg every six hours PRN anxiety -Individual counseling  Disposition: Discharge home  Nanine Means, NP 04/03/2016 10:16 AM  Patient seen face-to-face for psychiatric evaluation, chart reviewed and case discussed with the physician extender and developed treatment plan. Reviewed the  information documented and agree with the treatment plan. Thedore Mins, MD

## 2016-04-03 NOTE — ED Notes (Signed)
Pt discharged ambulatory with mother.  Mother was very angry that pt was discharged stating that he was not talking.  Pt however was stating quietly "Mom, its ok" over and over.  Pt was calm and cooperative and took his lithium prior to leaving.  All belongings were returned and pt changed his clothes prior to leaving.

## 2016-04-03 NOTE — [facility] Suicide Risk Assessment (Signed)
Suicide Risk Assessment  Discharge Assessment   [facility]   Principal Problem: Bipolar affective disorder, current episode mixed [facility](HCC) Discharge Diagnoses:  Patient Active Problem List   Diagnosis Date Noted  . Bipolar affective disorder, current episode mixed (HCC) [F31.60]     Priority: High  . Bipolar disorder (HCC) [F31.9] 04/01/2016  . 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 Psychosis [F29] 04/01/2016    Total Time spent with patient: 30 minutes  Musculoskeletal: Strength & Muscle Tone: within normal limits Gait & Station: normal Patient leans: N/A  Psychiatric Specialty Exam: Physical Exam  Constitutional: He is oriented to person, place, and time. He appears well-developed and well-nourished.  HENT:  Head: Normocephalic.  Neck: Normal range of motion.  Respiratory: Effort normal.  Musculoskeletal: Normal range of motion.  Neurological: He is alert and oriented to person, place, and time.  Psychiatric: His speech is normal and behavior is normal. Judgment and thought content normal. Cognition and memory are normal. He exhibits a depressed mood.    Review of Systems  Psychiatric/Behavioral: Positive for depression.  All other systems reviewed and are negative.   Blood pressure 141/90, pulse 94, temperature 98.1 F (36.7 C), temperature source Oral, resp. rate 16, SpO2 100 %.There is no height or weight on file to calculate BMI.  General Appearance: Casual  Eye Contact:  Good  Speech:  Normal Rate  Volume:  Normal  Mood:  Depressed, mild  Affect:  Congruent  Thought Process:  Descriptions of Associations: Intact  Orientation:  Full (Time, Place, and Person)  Thought Content:  WDL and Logical  Suicidal Thoughts:  No  Homicidal Thoughts:  No  Memory:  Immediate;   Good Recent;   Good Remote;   Good  Judgement:  Fair  Insight:  Fair  Psychomotor Activity:  Normal  Concentration:  Concentration: Fair and Attention Span: Fair  Recall:   Good  Fund of Knowledge:  Fair  Language:  Good  Akathisia:  No  Handed:  Right  AIMS (if indicated):     Assets:  Leisure Time Physical Health Resilience Social Support  ADL's:  Intact  Cognition:  WNL  Sleep:       Mental Status Per Nursing Assessment::   On Admission:   psychosis   Demographic Factors:  Male and Adolescent or young adult  Loss Factors: NA  Historical Factors: NA  Risk Reduction Factors:   Sense of responsibility to family, Living with another person, especially a relative and Positive social support  Continued Clinical Symptoms:  Depression, mild  Cognitive Features That Contribute To Risk:  None    Suicide Risk:  Minimal: No identifiable suicidal ideation.  Patients presenting with no risk factors but with morbid ruminations; may be classified as minimal risk based on the severity of the depressive symptoms    Plan Of Care/Follow-up recommendations:  Activity:  as tolerated Diet:  heart healthy diet  LORD, JAMISON, NP 04/03/2016, 10:21 AM

## 2016-04-03 NOTE — BH Assessment (Signed)
[facility] Assessment Progress Note  Per Thedore MinsMojeed Akintayo, MD, this pt does not require psychiatric hospitalization at this time.  Pt presents under IVC initiated by his mother, which Dr Jannifer FranklinAkintayo has rescinded.  [facility].  At 10:31 this Clinical research associatewriter [facility] [place] and spoke to ZephyrDonna.  She has scheduled an appointment for the pt with Dr Rene KocherEksir on Monday, 4/2/ 2018 at 13:00.  This has been included in pt's discharge instructions.  Pt's nurse, Kendal Hymendie, has been notified.  Brandon Canninghomas Rhyan Radler, MA Triage Specialist [phone]

## 2016-04-05 ENCOUNTER — Encounter (HOSPITAL_COMMUNITY): Payer: Self-pay | Admitting: *Deleted

## 2016-04-05 ENCOUNTER — Emergency Department (HOSPITAL_COMMUNITY): Payer: 59

## 2016-04-05 ENCOUNTER — Emergency Department (HOSPITAL_COMMUNITY)
Admission: EM | Admit: 2016-04-05 | Discharge: 2016-04-09 | Disposition: A | Discharge status: Other Acute Inpatient Hospital | Payer: 59 | Attending: Emergency Medicine | Admitting: Emergency Medicine

## 2016-04-05 DIAGNOSIS — Z79899 Other long term (current) drug therapy: Secondary | ICD-10-CM | POA: Diagnosis not present

## 2016-04-05 DIAGNOSIS — F061 Catatonic disorder due to known physiological condition: Secondary | ICD-10-CM | POA: Insufficient documentation

## 2016-04-05 DIAGNOSIS — F3164 Bipolar disorder, current episode mixed, severe, with psychotic features: Secondary | ICD-10-CM | POA: Insufficient documentation

## 2016-04-05 DIAGNOSIS — F29 Unspecified psychosis not due to a substance or known physiological condition: Secondary | ICD-10-CM | POA: Diagnosis not present

## 2016-04-05 DIAGNOSIS — F3160 Bipolar disorder, current episode mixed, unspecified: Secondary | ICD-10-CM | POA: Diagnosis present

## 2016-04-05 LAB — CBC
HEMATOCRIT: 44.7 % (ref 39.0–52.0)
HEMOGLOBIN: 15.4 g/dL (ref 13.0–17.0)
MCH: 26.8 pg (ref 26.0–34.0)
MCHC: 34.5 g/dL (ref 30.0–36.0)
MCV: 77.9 fL — ABNORMAL LOW (ref 78.0–100.0)
Platelets: 293 10*3/uL (ref 150–400)
RBC: 5.74 MIL/uL (ref 4.22–5.81)
RDW: 13.2 % (ref 11.5–15.5)
WBC: 15.1 10*3/uL — ABNORMAL HIGH (ref 4.0–10.5)

## 2016-04-05 LAB — COMPREHENSIVE METABOLIC PANEL
ALBUMIN: 4.5 g/dL (ref 3.5–5.0)
ALK PHOS: 66 U/L (ref 38–126)
ALT: 67 U/L — AB (ref 17–63)
AST: 41 U/L (ref 15–41)
Anion gap: 7 (ref 5–15)
BUN: 10 mg/dL (ref 6–20)
CALCIUM: 9.8 mg/dL (ref 8.9–10.3)
CHLORIDE: 107 mmol/L (ref 101–111)
CO2: 26 mmol/L (ref 22–32)
CREATININE: 1.33 mg/dL — AB (ref 0.61–1.24)
GFR calc Af Amer: >60 mL/min (ref >60)
GFR calc non Af Amer: >60 mL/min (ref >60)
GLUCOSE: 118 mg/dL — AB (ref 65–99)
Potassium: 3.6 mmol/L (ref 3.5–5.1)
SODIUM: 140 mmol/L (ref 135–145)
Total Bilirubin: 1 mg/dL (ref 0.3–1.2)
Total Protein: 8.3 g/dL — ABNORMAL HIGH (ref 6.5–8.1)

## 2016-04-05 LAB — RAPID URINE DRUG SCREEN, HOSP PERFORMED
AMPHETAMINES: NOT DETECTED
BARBITURATES: NOT DETECTED
Benzodiazepines: NOT DETECTED
COCAINE: NOT DETECTED
Opiates: NOT DETECTED
TETRAHYDROCANNABINOL: NOT DETECTED

## 2016-04-05 LAB — ACETAMINOPHEN LEVEL

## 2016-04-05 LAB — SALICYLATE LEVEL: Salicylate Lvl: <7 mg/dL (ref 2.8–30.0)

## 2016-04-05 LAB — LITHIUM LEVEL: Lithium Lvl: 0.62 mmol/L (ref 0.60–1.20)

## 2016-04-05 LAB — ETHANOL: Alcohol, Ethyl (B): <5 mg/dL (ref <5)

## 2016-04-05 MED ORDER — HYDROXYZINE HCL 25 MG PO TABS
25.0000 mg | ORAL_TABLET | Freq: Four times a day (QID) | ORAL | Status: DC | PRN
Start: 2016-04-05 — End: 2016-04-09

## 2016-04-05 MED ORDER — LITHIUM CARBONATE 300 MG PO CAPS
600.0000 mg | ORAL_CAPSULE | Freq: Two times a day (BID) | ORAL | Status: DC
  Administered 2016-04-05 – 2016-04-06 (×2): 600 mg via ORAL
  Filled 2016-04-05 (×2): qty 2

## 2016-04-05 NOTE — ED Triage Notes (Signed)
Per EMS report: pt states he needs to go to hospital.  Pt will not tell EMS why he is here but appears to have some mental health concerns.    EMS: BP: 152/p, HR: 100, CBG: 120, RR: 24

## 2016-04-05 NOTE — ED Notes (Signed)
Mother stating "I wish they would scan his head.  His face doesn't normally look like that.  I can't believe they let him come home on Tuesday.  He didn't even talk to the doctor that morning.  He was supposed to go on a job interview last Thursday.  He has anxiety.  [place] & he hasn't seen his psychiatrist since September.  He was supposed to see him February but didn't because he was supposed to start a new job.  He'll start a job a work for a few months and then doesn't work.  He takes a pic on his phone every morning when he's taking his meds.  Something is wrong with him.  I know they doctors won't talk to us without him signing the sheet, but I wish they would listen to what we have to say."  Informed will report to oncoming day shift of request to speak to psychiatrist.

## 2016-04-05 NOTE — ED Notes (Signed)
Bed: WA27 Expected date:  Expected time:  Means of arrival:  Comments: EMS-behavioral issues

## 2016-04-05 NOTE — ED Provider Notes (Signed)
[facility]-EMERGENCY DEPT Provider Note   CSN: [id number] Arrival date & time: 04/05/16  1532     History   Chief Complaint Chief Complaint  Patient presents with  . Medical Clearance    HPI Brandon Fuentes is a 26 y.o. male.  HPI   26 year old male with history of bipolar, psychosis, catatonic state, recent evaluation by behavioral health for concern of delusions, and catatonia who presents from home with concern for psychosis. Patient reportedly called EMS, however did not tell them why he was going to the emergency department. On arrival, he had reported that that "they" were after him, and otherwise on my examination, would not speak or provide any history. Patient had reported that he hadn't taken his medications today, and last night only took a few of his medications because he didn't like the way they made him feel. He reports he was reading the Bible and that "they zeroed in on me" and reported that he felt like "they" were following him. It is unclear who "they" are.    Patient answers no questions on my evaluation. Stares at me but does not speak.   Past Medical History:  Diagnosis Date  . Bipolar 1 disorder [facility])     Patient Active Problem List   Diagnosis Date Noted  . Bipolar disorder (HCC) 04/01/2016  . 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 Psychosis 04/01/2016  . Bipolar affective disorder, current episode mixed (HCC)     History reviewed. No pertinent surgical history.     Home Medications    Prior to Admission medications   Medication Sig Start Date End Date Taking? Authorizing Provider  lithium carbonate 300 MG capsule Take 600-900 mg by mouth 2 (two) times daily. 06/10/14  Yes Historical Provider, MD  lithium carbonate 600 MG capsule Take 1 capsule (600 mg total) by mouth 2 (two) times daily. 04/03/16  Yes Charm Rings, NP  hydrOXYzine (ATARAX/VISTARIL) 25 MG tablet Take 1 tablet (25 mg total) by mouth every 6 (six) hours as needed for anxiety. 04/03/16   Charm Rings, NP    Family History No family history on file.  Social History Social History  Substance Use Topics  . Smoking status: Never Smoker  . Smokeless tobacco: Never Used  . Alcohol use No     Allergies   Depakote [divalproex sodium] and Zoloft [sertraline hcl]   Review of Systems Review of Systems  Unable to perform ROS: Psychiatric disorder     Physical Exam Updated Vital Signs BP 150/97 (BP Location: Right Arm)   Pulse 90   Temp 99 F (37.2 C) (Oral)   Resp 20   SpO2 99%   Physical Exam  Constitutional: He appears well-developed and well-nourished. No distress.  HENT:  Head: Normocephalic and atraumatic.  Eyes: Conjunctivae and EOM are normal.  Neck: Normal range of motion.  Cardiovascular: Normal rate, regular rhythm, normal heart sounds and intact distal pulses.  Exam reveals no gallop and no friction rub.   No murmur heard. Pulmonary/Chest: Effort normal and breath sounds normal. No respiratory distress. He has no wheezes. He has no rales.  Abdominal: Soft. He exhibits no distension. There is no tenderness. There is no guarding.  Musculoskeletal: He exhibits no edema.  Neurological: He is alert.  Skin: Skin is warm and dry. He is not diaphoretic.  Psychiatric: His affect is blunt. He is noncommunicative.  Nursing note and vitals reviewed.    ED Treatments / Results  Labs (all labs ordered are listed, but  only abnormal results are displayed) Labs Reviewed  COMPREHENSIVE METABOLIC PANEL - Abnormal; Notable for the following:       Result Value   Glucose, Bld 118 (*)    Creatinine, Ser 1.33 (*)    Total Protein 8.3 (*)    ALT 67 (*)    All other components within normal limits  ACETAMINOPHEN LEVEL - Abnormal; Notable for the following:    Acetaminophen (Tylenol), Serum <10 (*)    All other components within normal limits  CBC - Abnormal; Notable for the following:    WBC 15.1 (*)    MCV 77.9 (*)    All other components within normal limits    ETHANOL  SALICYLATE LEVEL  RAPID URINE DRUG SCREEN, HOSP PERFORMED  LITHIUM LEVEL    EKG  EKG Interpretation None       Radiology Ct Head Wo Contrast  Result Date: 04/05/2016 CLINICAL DATA:  Mental health concerns history of bipolar altered mental status EXAM: CT HEAD WITHOUT CONTRAST TECHNIQUE: Contiguous axial images were obtained from the base of the skull through the vertex without intravenous contrast. COMPARISON:  None. FINDINGS: Brain: No evidence of acute infarction, hemorrhage, hydrocephalus, extra-axial collection or mass lesion/mass effect. Vascular: No hyperdense vessel or unexpected calcification. Skull: Normal. Negative for fracture or focal lesion. Sinuses/Orbits: Mucosal thickening in the ethmoid sinuses. No acute orbital abnormality. Other: None IMPRESSION: No CT evidence for acute intracranial abnormality. Electronically Signed   By: Jasmine PangKim  Fujinaga M.D.   On: 04/05/2016 22:19    Procedures Procedures (including critical care time)  Medications Ordered in ED Medications  hydrOXYzine (ATARAX/VISTARIL) tablet 25 mg (not administered)  lithium carbonate capsule 600-900 mg (600 mg Oral Given 04/05/16 2313)     Initial Impression / Assessment and Plan / ED Course  I have reviewed the triage vital signs and the nursing notes.  Pertinent labs & imaging results that were available during my care of the patient were reviewed by me and considered in my medical decision making (see chart for details).     26 year old male with history of bipolar, psychosis, catatonic state, recent evaluation by behavioral health for concern of delusions, and catatonia who presents from home with concern for psychosis. Patient reportedly called EMS, however did not tell them why he was going to the emergency department. On arrival, he had reported that that "they" were after him, and otherwise on my examination, would not speak or provide any history. I'm concerned that patient is catatonic  with psychosis. Reportedly he is not taking his medications. Patient without signs of acute medical emergency.  Labs obtained and WNL. CT head WNL. TTS consulted and recommend inpatient.   Final Clinical Impressions(s) / ED Diagnoses   Final diagnoses:  Catatonia  Psychosis, unspecified psychosis type    New Prescriptions New Prescriptions   No medications on file     Alvira MondayErin Ercilia Bettinger, MD 04/06/16 0111

## 2016-04-05 NOTE — ED Notes (Signed)
Patient transported to CT 

## 2016-04-05 NOTE — ED Notes (Addendum)
Pt with urinary incontinent episode.  Noted when transferred to w/c.

## 2016-04-05 NOTE — BH Assessment (Signed)
Pt has been faxed to the following inpt facilities for review: [facility], [facility], [facility], [facility], [facility], [facility], [facility], [facility], [facility]. °  °Aquicha Duff, MSW, LCSWA  °

## 2016-04-05 NOTE — BH Assessment (Signed)
Tele Assessment Note   Brandon Fuentes is an 26 y.o. male. Pt would not speak doing assessment. This Clinical research associatewriter could not gather any information from the Pt.  Collateral Information gathered from EPIC notes: "Hx of bipolar, psychosis, catatonic state, recent evaluation by behavioral health for concern of delusions, and catatonia who presents from home with concern for psychosis. Patient reportedly called EMS, however did not tell them why he was going to the emergency department. On arrival, he had reported that that "they" were after him, and otherwise on my examination, would not speak or provide any history. Patient had reported that he hadn't taken his medications today, and last night only took a few of his medications because he didn't like the way they made him feel. He reports he was reading the Bible and that "they zeroed in on me" and reported that he felt like "they" were following him. It is unclear who "they" are."    Pt seen for the same on 03/31/2016. Pt D/C from [facility] on 04/03/16.  Writer consulted with Brandon NottinghamJamison, DNP. Per Brandon Fuentes Pt meets inpatient criteria. TTS to seek placement.  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 Bipolar 1 disorder (HCC)     History reviewed. No pertinent surgical history.  Family History: No family history on file.  Social History:  reports that he has never smoked. He has never used smokeless tobacco. He reports that he does not drink alcohol or use drugs.  Additional Social History:  Alcohol / Drug Use Pain Medications: Please see MAR Prescriptions: Please see MAR Over the Counter: Please see MAR History of alcohol / drug use?: No history of alcohol / drug abuse Longest period of sobriety (when/how long): NA  CIWA: CIWA-Ar BP: (!) 136/109 Pulse Rate: 103 COWS:    PATIENT STRENGTHS: (choose at least two) Communication skills Supportive family/friends  Allergies:  Allergies   Allergen Reactions  . Depakote [Divalproex Sodium] Other (See Comments)    Sleepiness, anger  . Zoloft [Sertraline Hcl]     anger    Home Medications:  (Not in a hospital admission)  OB/GYN Status:  No LMP for male patient.  General Assessment Data Location of Assessment: [facility] TTS Assessment: In system Is this a Tele or Face-to-Face Assessment?: Face-to-Face Is this an Initial Assessment or a Re-assessment for this encounter?: Initial Assessment Marital status: Single Maiden name: NA Is patient pregnant?: No Pregnancy Status: No Living Arrangements: Parent Can pt return to current living arrangement?: Yes Admission Status: Voluntary Is patient capable of signing voluntary admission?: Yes Referral Source: Self/Family/Friend Insurance type: [place]     Crisis Care Plan Living Arrangements: Parent Legal Guardian: Other: (self) Name of Psychiatrist: Dr. Maggie FontMarion Wright Name of Therapist: none  Education Status Is patient currently in school?: No Current Grade: NA Highest grade of school patient has completed: 12th Name of school: NA Contact person: NA  Risk to self with the past 6 months Suicidal Ideation: No Has patient been a risk to self within the past 6 months prior to admission? : No Suicidal Intent: No Has patient had any suicidal intent within the past 6 months prior to admission? : No Is patient at risk for suicide?: No Suicidal Plan?: No Has patient had any suicidal plan within the past 6 months prior to admission? : No Access to Means: No What has been your use of drugs/alcohol within the last 12 months?: NA Previous Attempts/Gestures: No How many  times?: 0 Other Self Harm Risks: NA Triggers for Past Attempts: None known Intentional Self Injurious Behavior: None Family Suicide History: No Recent stressful life event(s): Other (Comment) (unknown) Persecutory voices/beliefs?: No Depression: No Depression Symptoms:  (Pt does not respond) Substance  abuse history and/or treatment for substance abuse?: No Suicide prevention information given to non-admitted patients: Not applicable  Risk to Others within the past 6 months Homicidal Ideation: No Does patient have any lifetime risk of violence toward others beyond the six months prior to admission? : No Thoughts of Harm to Others: No Current Homicidal Intent: No Current Homicidal Plan: No Access to Homicidal Means: No Identified Victim: NA History of harm to others?: No Assessment of Violence: None Noted Violent Behavior Description: NA Does patient have access to weapons?: No Criminal Charges Pending?: No Does patient have a court date: No Is patient on probation?: No  Psychosis Hallucinations:  (unknown) Delusions:  (unknown)  Mental Status Report Appearance/Hygiene: Bizarre, In scrubs Eye Contact: Fair Motor Activity: Freedom of movement Speech: Unable to assess Level of Consciousness: Alert Mood: Other (Comment) (UTA) Affect: Unable to Assess Anxiety Level: Minimal Thought Processes: Unable to Assess Judgement: Unable to Assess Orientation: Unable to assess Obsessive Compulsive Thoughts/Behaviors: None  Cognitive Functioning Concentration: Unable to Assess Memory: Unable to Assess IQ: Average Insight: Unable to Assess Impulse Control: Unable to Assess Appetite: Fair Weight Loss: 0 Weight Gain: 0 Sleep: Unable to Assess Total Hours of Sleep: 8 Vegetative Symptoms: None  ADLScreening [facility]) Patient's cognitive ability adequate to safely complete daily activities?: Yes Patient able to express need for assistance with ADLs?: Yes Independently performs ADLs?: Yes (appropriate for developmental age)  Prior Inpatient Therapy Prior Inpatient Therapy: Yes Prior Therapy Dates: 2011 Prior Therapy Facilty/Provider(s): [facility] Reason for Treatment: Bipolar  Prior Outpatient Therapy Prior Outpatient Therapy: Yes Prior Therapy Dates: 2017 Prior  Therapy Facilty/Provider(s): Dr. Maggie Font Reason for Treatment: Med Management  Does patient have an ACCT team?: No Does patient have Intensive In-House Services?  : No Does patient have [facility] services? : No Does patient have P4CC services?: No  ADL Screening (condition at time of admission) Patient's cognitive ability adequate to safely complete daily activities?: Yes Is the patient deaf or have difficulty hearing?: No Does the patient have difficulty seeing, even when wearing glasses/contacts?: No Does the patient have difficulty concentrating, remembering, or making decisions?: No Patient able to express need for assistance with ADLs?: Yes Does the patient have difficulty dressing or bathing?: No Independently performs ADLs?: Yes (appropriate for developmental age) Does the patient have difficulty walking or climbing stairs?: No Weakness of Legs: None Weakness of Arms/Hands: None       Abuse/Neglect Assessment (Assessment to be complete while patient is alone) Physical Abuse: Denies Verbal Abuse: Denies Sexual Abuse: Denies Exploitation of patient/patient's resources: Denies Self-Neglect: Denies     Merchant navy officer (For Healthcare) Does Patient Have a Medical Advance Directive?: No    Additional Information 1:1 In Past 12 Months?: No CIRT Risk: No Elopement Risk: No Does patient have medical clearance?: Yes     Disposition:  Disposition Initial Assessment Completed for this Encounter: Yes Disposition of Patient: Inpatient treatment program Type of inpatient treatment program: Adult  Emmit Pomfret 04/05/2016 6:33 PM

## 2016-04-05 NOTE — ED Notes (Signed)
Pt reports hasn't taken any medications and took a few medications last night.  Pt was reading his bible and states that "they zeroed in on him."  Pt states he doesn't like to take his medicines because he doesn't like the way it makes him feel.

## 2016-04-05 NOTE — ED Notes (Signed)
This Clinical research associatewriter offered to feed pt.  Pt stated "I don't want it.  I don't want it to be like this."   Father stated "me and his mother don't understand it.  They sent him home like this.  He was able to tell me he called for help.  His mother and I wouldn't have known he was here if someone hadn't called asking about his meds."

## 2016-04-06 DIAGNOSIS — F29 Unspecified psychosis not due to a substance or known physiological condition: Secondary | ICD-10-CM | POA: Diagnosis not present

## 2016-04-06 DIAGNOSIS — F3164 Bipolar disorder, current episode mixed, severe, with psychotic features: Secondary | ICD-10-CM

## 2016-04-06 DIAGNOSIS — Z888 Allergy status to other drugs, medicaments and biological substances status: Secondary | ICD-10-CM | POA: Diagnosis not present

## 2016-04-06 DIAGNOSIS — Z79899 Other long term (current) drug therapy: Secondary | ICD-10-CM

## 2016-04-06 DIAGNOSIS — F061 Catatonic disorder due to known physiological condition: Secondary | ICD-10-CM | POA: Diagnosis not present

## 2016-04-06 LAB — CBC WITH DIFFERENTIAL/PLATELET
BASOS ABS: 0 10*3/uL (ref 0.0–0.1)
Basophils Relative: 0 %
Eosinophils Absolute: 0.2 10*3/uL (ref 0.0–0.7)
Eosinophils Relative: 1 %
HEMATOCRIT: 44.3 % (ref 39.0–52.0)
HEMOGLOBIN: 15.4 g/dL (ref 13.0–17.0)
LYMPHS PCT: 19 %
Lymphs Abs: 2.7 10*3/uL (ref 0.7–4.0)
MCH: 27 pg (ref 26.0–34.0)
MCHC: 34.8 g/dL (ref 30.0–36.0)
MCV: 77.6 fL — AB (ref 78.0–100.0)
Monocytes Absolute: 0.9 10*3/uL (ref 0.1–1.0)
Monocytes Relative: 6 %
NEUTROS ABS: 10.2 10*3/uL — AB (ref 1.7–7.7)
Neutrophils Relative %: 74 %
Platelets: 272 10*3/uL (ref 150–400)
RBC: 5.71 MIL/uL (ref 4.22–5.81)
RDW: 13.2 % (ref 11.5–15.5)
WBC: 14 10*3/uL — AB (ref 4.0–10.5)

## 2016-04-06 LAB — COMPREHENSIVE METABOLIC PANEL
ALT: 66 U/L — ABNORMAL HIGH (ref 17–63)
AST: 39 U/L (ref 15–41)
Albumin: 4.5 g/dL (ref 3.5–5.0)
Alkaline Phosphatase: 70 U/L (ref 38–126)
Anion gap: 10 (ref 5–15)
BUN: 11 mg/dL (ref 6–20)
CHLORIDE: 106 mmol/L (ref 101–111)
CO2: 22 mmol/L (ref 22–32)
Calcium: 9.8 mg/dL (ref 8.9–10.3)
Creatinine, Ser: 1.23 mg/dL (ref 0.61–1.24)
Glucose, Bld: 134 mg/dL — ABNORMAL HIGH (ref 65–99)
POTASSIUM: 3.4 mmol/L — AB (ref 3.5–5.1)
Sodium: 138 mmol/L (ref 135–145)
Total Bilirubin: 1.2 mg/dL (ref 0.3–1.2)
Total Protein: 8.2 g/dL — ABNORMAL HIGH (ref 6.5–8.1)

## 2016-04-06 LAB — LIPASE, BLOOD: LIPASE: 69 U/L — AB (ref 11–51)

## 2016-04-06 LAB — URINALYSIS, ROUTINE W REFLEX MICROSCOPIC
Bilirubin Urine: NEGATIVE
GLUCOSE, UA: NEGATIVE mg/dL
Hgb urine dipstick: NEGATIVE
Ketones, ur: 20 mg/dL — AB
LEUKOCYTES UA: NEGATIVE
Nitrite: NEGATIVE
PH: 6 (ref 5.0–8.0)
PROTEIN: NEGATIVE mg/dL
Specific Gravity, Urine: 1.024 (ref 1.005–1.030)

## 2016-04-06 LAB — CBG MONITORING, ED: GLUCOSE-CAPILLARY: 124 mg/dL — AB (ref 65–99)

## 2016-04-06 MED ORDER — LORAZEPAM 2 MG/ML IJ SOLN
2.0000 mg | Freq: Two times a day (BID) | INTRAMUSCULAR | Status: DC
  Administered 2016-04-07 – 2016-04-08 (×2): 2 mg via INTRAMUSCULAR
  Filled 2016-04-06 (×2): qty 1

## 2016-04-06 MED ORDER — STERILE WATER FOR INJECTION IJ SOLN
INTRAMUSCULAR | Status: AC
Start: 2016-04-06 — End: 2016-04-06
  Administered 2016-04-06: 1.2 mL
  Filled 2016-04-06: qty 10

## 2016-04-06 MED ORDER — LORAZEPAM 1 MG PO TABS
2.0000 mg | ORAL_TABLET | Freq: Once | ORAL | Status: AC
  Administered 2016-04-06: 2 mg via ORAL
  Filled 2016-04-06: qty 2

## 2016-04-06 MED ORDER — ARIPIPRAZOLE 5 MG PO TABS
5.0000 mg | ORAL_TABLET | Freq: Every day | ORAL | Status: DC
  Administered 2016-04-06 – 2016-04-07 (×2): 5 mg via ORAL
  Filled 2016-04-06 (×4): qty 1

## 2016-04-06 MED ORDER — ASENAPINE MALEATE 5 MG SL SUBL
10.0000 mg | SUBLINGUAL_TABLET | Freq: Two times a day (BID) | SUBLINGUAL | Status: DC | PRN
  Administered 2016-04-07: 10 mg via SUBLINGUAL
  Filled 2016-04-06: qty 2

## 2016-04-06 MED ORDER — LORAZEPAM 2 MG/ML IJ SOLN
2.0000 mg | Freq: Once | INTRAMUSCULAR | Status: AC
  Administered 2016-04-06: 2 mg via INTRAMUSCULAR
  Filled 2016-04-06: qty 1

## 2016-04-06 MED ORDER — HALOPERIDOL LACTATE 5 MG/ML IJ SOLN
5.0000 mg | Freq: Once | INTRAMUSCULAR | Status: AC
  Administered 2016-04-06: 5 mg via INTRAMUSCULAR
  Filled 2016-04-06: qty 1

## 2016-04-06 MED ORDER — LORAZEPAM 1 MG PO TABS
1.0000 mg | ORAL_TABLET | Freq: Three times a day (TID) | ORAL | Status: DC
  Administered 2016-04-07 – 2016-04-09 (×5): 1 mg via ORAL
  Filled 2016-04-06 (×6): qty 1

## 2016-04-06 MED ORDER — ZIPRASIDONE MESYLATE 20 MG IM SOLR
10.0000 mg | Freq: Once | INTRAMUSCULAR | Status: AC
  Administered 2016-04-06: 10 mg via INTRAMUSCULAR
  Filled 2016-04-06: qty 20

## 2016-04-06 MED ORDER — LITHIUM CARBONATE 300 MG PO CAPS
600.0000 mg | ORAL_CAPSULE | Freq: Two times a day (BID) | ORAL | Status: DC
  Administered 2016-04-06 – 2016-04-09 (×6): 600 mg via ORAL
  Filled 2016-04-06 (×6): qty 2

## 2016-04-06 MED ORDER — ZIPRASIDONE MESYLATE 20 MG IM SOLR
10.0000 mg | Freq: Two times a day (BID) | INTRAMUSCULAR | Status: DC
  Administered 2016-04-07 – 2016-04-08 (×3): 10 mg via INTRAMUSCULAR
  Filled 2016-04-06 (×3): qty 20

## 2016-04-06 NOTE — BH Assessment (Signed)
[facility] Assessment Progress Note  Per Thedore MinsMojeed Akintayo, MD, this pt requires psychiatric hospitalization at this time.  As previously noted, Dr Jannifer FranklinAkintayo has initiated IVC for pt, and Findings and Custody Order has since been served.  The following facilities have been contacted to seek placement for this pt, with results as noted:  Beds available, information sent, decision pending:  [facility]   Declined:  [place] (needs further medical clearance)    At capacity:  [facility] (no high acuity beds available[facility]   Doylene Canninghomas Everhett Bozard, [place] Triage Specialist [phone]

## 2016-04-06 NOTE — ED Notes (Signed)
Patient notified that urine sample is needed.  Urinal at bedside and toilet hat in bathroom.  Patient has history of being incontinent while in hospital.  Patient not in SAPU because of his incontinence. Urinates on floor and in sinks.

## 2016-04-06 NOTE — ED Notes (Signed)
[facility]

## 2016-04-06 NOTE — ED Notes (Signed)
Pt OOB and shutting door.  Door re-opened, pt repeating "just leave me alone, just leave me alone."  Security called to BS.

## 2016-04-06 NOTE — ED Notes (Signed)
[facility]  Security also called to BS.  Prior to calling Dr. Preston FleetingGlick, pt came running out of room.  Encouraged to go back into room, pt complied.   Pt came out of room again, walking around the unit, remaining non-verbal, walked back to doorway, began to stagger, pt assisted back to bed.

## 2016-04-06 NOTE — ED Notes (Signed)
Patient asleep, and would not awaken to take medications.  He opened his eyes slightly and moved his hand but would not respond to nurse.

## 2016-04-06 NOTE — ED Notes (Addendum)
Pt talking softly, tearful & stated "I was in church, being spiritual and what they were saying, I became afraid.  I've done things I shouldn't have.  They said 'Jesus is coming'.  I should have known better.  Just hear me out."  Pt continued talking in a low voice making comments r/t religion and porn. Pt denies auditory or visual hallucinations.

## 2016-04-06 NOTE — ED Notes (Signed)
Pt argumentative, with echolalia, making accusations stating "you all want me to take meds because you want to make the health system better?  Are you going to make it better?  This book here (Bible) says we are to take care of our body.  Is that what you all do here?"

## 2016-04-06 NOTE — ED Provider Notes (Addendum)
Patient has become agitated. He is in the bed shaking his arms and legs in a pattern that does not appear to be a seizure. He is periodically getting up from bed and walking around the unit and cannot be directed. On exam, lungs are clear and heart has regular rate and rhythm. He does not clearly show any abdominal tenderness, but he pushes my hand away whenever I try to palpate his abdomen. It is not clear if his problems are psychiatric or medical. Initial evaluation had urine drug screen without urinalysis. Will repeat screening labs and try to get a urine for urinalysis. Unfortunately, he has been incontinent of urine and not cooperative with condom catheter. He is given a dose of haloperidol.      Dione Boozeavid Tadao Emig, MD 04/06/16 [phone]  He was reevaluated after getting haloperidol, and there was little change. Is given a second dose of haloperidol and is resting comfortably. Still waiting for lab results.  CRITICAL CARE Performed by: Dione BoozeGLICK,Aariana Shankland Total critical care time: 35 minutes Critical care time was exclusive of separately billable procedures and treating other patients. Critical care was necessary to treat or prevent imminent or life-threatening deterioration. Critical care was time spent personally by me on the following activities: development of treatment plan with patient and/or surrogate as well as nursing, discussions with consultants, evaluation of patient's response to treatment, examination of patient, obtaining history from patient or surrogate, ordering and performing treatments and interventions, ordering and review of laboratory studies, ordering and review of radiographic studies, pulse oximetry and re-evaluation of patient's condition.    Dione Boozeavid Pasqualina Colasurdo, MD 04/06/16 [phone]

## 2016-04-06 NOTE — BH Assessment (Signed)
[facility] Assessment Progress Note  Per Thedore MinsMojeed Akintayo, MD, this pt requires psychiatric hospitalization at this time.  Pt also meets criteria for IVC, which Dr Jannifer FranklinAkintayo has initiated.  IVC documents [place] [facility], and at [employer]11:02 Magistrate [place] confirms receipt.  As of this writing, service of Findings and Custody Order is pending.  This Clinical research associatewriter will seek placement for pt.  Brandon Canninghomas Daemien Fronczak, MA Triage Specialist [phone]

## 2016-04-06 NOTE — Consult Note (Signed)
[facility] Psychiatry Consult   Reason for Consult:  Psychiatric evaluation Referring Physician:  EDP Patient Identification: Brandon Fuentes MRN:  [id number] Principal Diagnosis: Bipolar affective disorder, current episode mixed [facility]) Diagnosis:   Patient Active Problem List   Diagnosis Date Noted  . Bipolar affective disorder, current episode mixed ([place]) [F31.60]     Priority: High  . Bipolar disorder ([place]) [F31.9] 04/01/2016  . 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 Psychosis [F29] 04/01/2016    Total Time spent with patient: 45 minutes  Subjective:   Brandon Fuentes is a 26 y.o. male patient admitted with disorganized and bizarre behavior.  HPI: Patient with long history of Bipolar disorder dating back to age 14. He recently moved from [place], [place] to [place], [place] with his mother. Patient is a poor historian who is selectively mute today. However, collateral information obtained from his mother revealed that he was doing fairly well until 2 weeks ago on Lithium. But he has since relapsing between hypomaniac and depressed mood, he was brought to the ED last week he became mute which was unusual according patient's mother. Yesterday, patient presents to the ED with bizarre, disorganized behavior, agitation and paranoia.  Patient reportedly called EMS and told them some people were after him and thought his life was in danger. He states that he stopped taking his medication yesterday and was reading his Bible thereafter he claimed that: "they zeroed in on me" and started feeling guilty about things he had done in the past which he would not disclose. Patient is restless, paces the unit up and down mutering to himself. He denies drugs and alcohol abuse. Mother thinks maybe he has some physical problem in addition to his mental illness, he is being physically checked about the ED physician.  Past Psychiatric History: as above  Risk to Self: Suicidal Ideation: No Suicidal Intent:  No Is patient at risk for suicide?: No Suicidal Plan?: No Access to Means: No What has been your use of drugs/alcohol within the last 12 months?: NA How many times?: 0 Other Self Harm Risks: NA Triggers for Past Attempts: None known Intentional Self Injurious Behavior: None Risk to Others: Homicidal Ideation: No Thoughts of Harm to Others: No Current Homicidal Intent: No Current Homicidal Plan: No Access to Homicidal Means: No Identified Victim: NA History of harm to others?: No Assessment of Violence: None Noted Violent Behavior Description: NA Does patient have access to weapons?: No Criminal Charges Pending?: No Does patient have a court date: No Prior Inpatient Therapy: Prior Inpatient Therapy: Yes Prior Therapy Dates: 2011 Prior Therapy Facilty/Provider(s): [facility] Reason for Treatment: Bipolar Prior Outpatient Therapy: Prior Outpatient Therapy: Yes Prior Therapy Dates: 2017 Prior Therapy Facilty/Provider(s): Dr. Catha Nottingham Reason for Treatment: Med Management  Does patient have an ACCT team?: No Does patient have Intensive In-House Services?  : No Does patient have [facility] services? : No Does patient have P4CC services?: No  Past Medical History:  Past Medical History:  Diagnosis Date  . Bipolar 1 disorder ([place])    History reviewed. No pertinent surgical history. Family History: No family history on file. Family Psychiatric  History:  Social History:  History  Alcohol Use No     History  Drug Use No    Social History   Social History  . Marital status: Single    Spouse name: N/A  . Number of children: N/A  . Years of education: N/A   Social History Main Topics  . Smoking status: Never Smoker  .  Smokeless tobacco: Never Used  . Alcohol use No  . Drug use: No  . Sexual activity: Not Asked   Other Topics Concern  . None   Social History Narrative  . None   Additional Social History:    Allergies:   Allergies  Allergen Reactions  . Depakote  [Divalproex Sodium] Other (See Comments)    Sleepiness, anger  . Zoloft [Sertraline Hcl]     anger    Labs:  Results for orders placed or performed during the hospital encounter of 04/05/16 (from the past 48 hour(s))  Rapid urine drug screen (hospital performed)     Status: None   Collection Time: 04/05/16  3:32 PM  Result Value Ref Range   Opiates NONE DETECTED NONE DETECTED   Cocaine NONE DETECTED NONE DETECTED   Benzodiazepines NONE DETECTED NONE DETECTED   Amphetamines NONE DETECTED NONE DETECTED   Tetrahydrocannabinol NONE DETECTED NONE DETECTED   Barbiturates NONE DETECTED NONE DETECTED    Comment:        DRUG SCREEN FOR MEDICAL PURPOSES ONLY.  IF CONFIRMATION IS NEEDED FOR ANY PURPOSE, NOTIFY LAB WITHIN 5 DAYS.        LOWEST DETECTABLE LIMITS FOR URINE DRUG SCREEN Drug Class       Cutoff (ng/mL) Amphetamine      1000 Barbiturate      200 Benzodiazepine   096 Tricyclics       045 Opiates          300 Cocaine          300 THC              50   Comprehensive metabolic panel     Status: Abnormal   Collection Time: 04/05/16  4:43 PM  Result Value Ref Range   Sodium 140 135 - 145 mmol/L   Potassium 3.6 3.5 - 5.1 mmol/L   Chloride 107 101 - 111 mmol/L   CO2 26 22 - 32 mmol/L   Glucose, Bld 118 (H) 65 - 99 mg/dL   BUN 10 6 - 20 mg/dL   Creatinine, Ser 1.33 (H) 0.61 - 1.24 mg/dL   Calcium 9.8 8.9 - 10.3 mg/dL   Total Protein 8.3 (H) 6.5 - 8.1 g/dL   Albumin 4.5 3.5 - 5.0 g/dL   AST 41 15 - 41 U/L   ALT 67 (H) 17 - 63 U/L   Alkaline Phosphatase 66 38 - 126 U/L   Total Bilirubin 1.0 0.3 - 1.2 mg/dL   GFR calc non Af Amer >60 >60 mL/min   GFR calc Af Amer >60 >60 mL/min    Comment: (NOTE) The eGFR has been calculated using the CKD EPI equation. This calculation has not been validated in all clinical situations. eGFR's persistently <60 mL/min signify possible Chronic Kidney Disease.    Anion gap 7 5 - 15  Ethanol     Status: None   Collection Time: 04/05/16   4:43 PM  Result Value Ref Range   Alcohol, Ethyl (B) <5 <5 mg/dL    Comment:        LOWEST DETECTABLE LIMIT FOR SERUM ALCOHOL IS 5 mg/dL FOR MEDICAL PURPOSES ONLY   Salicylate level     Status: None   Collection Time: 04/05/16  4:43 PM  Result Value Ref Range   Salicylate Lvl <4.0 2.8 - 30.0 mg/dL  Acetaminophen level     Status: Abnormal   Collection Time: 04/05/16  4:43 PM  Result Value Ref Range   Acetaminophen (  Tylenol), Serum <10 (L) 10 - 30 ug/mL    Comment:        THERAPEUTIC CONCENTRATIONS VARY SIGNIFICANTLY. A RANGE OF 10-30 ug/mL MAY BE AN EFFECTIVE CONCENTRATION FOR MANY PATIENTS. HOWEVER, SOME ARE BEST TREATED AT CONCENTRATIONS OUTSIDE THIS RANGE. ACETAMINOPHEN CONCENTRATIONS >150 ug/mL AT 4 HOURS AFTER INGESTION AND >50 ug/mL AT 12 HOURS AFTER INGESTION ARE OFTEN ASSOCIATED WITH TOXIC REACTIONS.   cbc     Status: Abnormal   Collection Time: 04/05/16  4:43 PM  Result Value Ref Range   WBC 15.1 (H) 4.0 - 10.5 K/uL   RBC 5.74 4.22 - 5.81 MIL/uL   Hemoglobin 15.4 13.0 - 17.0 g/dL   HCT 44.7 39.0 - 52.0 %   MCV 77.9 (L) 78.0 - 100.0 fL   MCH 26.8 26.0 - 34.0 pg   MCHC 34.5 30.0 - 36.0 g/dL   RDW 13.2 11.5 - 15.5 %   Platelets 293 150 - 400 K/uL  Lithium level     Status: None   Collection Time: 04/05/16  4:44 PM  Result Value Ref Range   Lithium Lvl 0.62 0.60 - 1.20 mmol/L  Comprehensive metabolic panel     Status: Abnormal   Collection Time: 04/06/16  8:00 AM  Result Value Ref Range   Sodium 138 135 - 145 mmol/L   Potassium 3.4 (L) 3.5 - 5.1 mmol/L   Chloride 106 101 - 111 mmol/L   CO2 22 22 - 32 mmol/L   Glucose, Bld 134 (H) 65 - 99 mg/dL   BUN 11 6 - 20 mg/dL   Creatinine, Ser 1.23 0.61 - 1.24 mg/dL   Calcium 9.8 8.9 - 10.3 mg/dL   Total Protein 8.2 (H) 6.5 - 8.1 g/dL   Albumin 4.5 3.5 - 5.0 g/dL   AST 39 15 - 41 U/L   ALT 66 (H) 17 - 63 U/L   Alkaline Phosphatase 70 38 - 126 U/L   Total Bilirubin 1.2 0.3 - 1.2 mg/dL   GFR calc non Af Amer  >60 >60 mL/min   GFR calc Af Amer >60 >60 mL/min    Comment: (NOTE) The eGFR has been calculated using the CKD EPI equation. This calculation has not been validated in all clinical situations. eGFR's persistently <60 mL/min signify possible Chronic Kidney Disease.    Anion gap 10 5 - 15  Lipase, blood     Status: Abnormal   Collection Time: 04/06/16  8:00 AM  Result Value Ref Range   Lipase 69 (H) 11 - 51 U/L  CBC with Differential     Status: Abnormal   Collection Time: 04/06/16  8:00 AM  Result Value Ref Range   WBC 14.0 (H) 4.0 - 10.5 K/uL   RBC 5.71 4.22 - 5.81 MIL/uL   Hemoglobin 15.4 13.0 - 17.0 g/dL   HCT 44.3 39.0 - 52.0 %   MCV 77.6 (L) 78.0 - 100.0 fL   MCH 27.0 26.0 - 34.0 pg   MCHC 34.8 30.0 - 36.0 g/dL   RDW 13.2 11.5 - 15.5 %   Platelets 272 150 - 400 K/uL   Neutrophils Relative % 74 %   Neutro Abs 10.2 (H) 1.7 - 7.7 K/uL   Lymphocytes Relative 19 %   Lymphs Abs 2.7 0.7 - 4.0 K/uL   Monocytes Relative 6 %   Monocytes Absolute 0.9 0.1 - 1.0 K/uL   Eosinophils Relative 1 %   Eosinophils Absolute 0.2 0.0 - 0.7 K/uL   Basophils Relative 0 %  Basophils Absolute 0.0 0.0 - 0.1 K/uL    Current Facility-Administered Medications  Medication Dose Route Frequency Provider Last Rate Last Dose  . ARIPiprazole (ABILIFY) tablet 5 mg  5 mg Oral Daily Alliana Mcauliff, MD      . asenapine (SAPHRIS) sublingual tablet 10 mg  10 mg Sublingual Q12H PRN Corena Pilgrim, MD      . hydrOXYzine (ATARAX/VISTARIL) tablet 25 mg  25 mg Oral Q6H PRN Gareth Morgan, MD      . lithium carbonate capsule 600 mg  600 mg Oral BID WC Galen Malkowski, MD      . LORazepam (ATIVAN) tablet 1 mg  1 mg Oral TID Corena Pilgrim, MD       Current Outpatient Prescriptions  Medication Sig Dispense Refill  . lithium carbonate 300 MG capsule Take 600-900 mg by mouth 2 (two) times daily.    Marland Kitchen lithium carbonate 600 MG capsule Take 1 capsule (600 mg total) by mouth 2 (two) times daily. 60 capsule 0  .  hydrOXYzine (ATARAX/VISTARIL) 25 MG tablet Take 1 tablet (25 mg total) by mouth every 6 (six) hours as needed for anxiety. 30 tablet 0    Musculoskeletal: Strength & Muscle Tone: within normal limits Gait & Station: normal Patient leans: N/A  Psychiatric Specialty Exam: Physical Exam  Psychiatric: His mood appears anxious. His speech is delayed and tangential. He is agitated and combative. Thought content is paranoid and delusional. Cognition and memory are normal. He expresses impulsivity.    Review of Systems  Constitutional: Positive for malaise/fatigue.  HENT: Negative.   Eyes: Negative.   Respiratory: Negative.   Cardiovascular: Negative.   Gastrointestinal: Negative.   Genitourinary: Negative.   Musculoskeletal: Negative.   Skin: Negative.   Neurological: Positive for weakness.  Endo/Heme/Allergies: Negative.   Psychiatric/Behavioral: Positive for depression and hallucinations. The patient is nervous/anxious and has insomnia.     Blood pressure 164/90, pulse 84, temperature 98.5 F (36.9 C), temperature source Oral, resp. rate 20, SpO2 99 %.There is no height or weight on file to calculate BMI.  General Appearance: Casual  Eye Contact:  Poor  Speech:  Slow  Volume:  Decreased  Mood:  Irritable  Affect:  Blunt and Constricted  Thought Process:  Disorganized  Orientation:  Other:  only to person and place  Thought Content:  Illogical, Delusions and Paranoid Ideation  Suicidal Thoughts:  No  Homicidal Thoughts:  No  Memory:  Immediate;   Fair Recent;   Fair Remote;   Fair  Judgement:  Impaired  Insight:  Shallow  Psychomotor Activity:  Restlessness  Concentration:  Concentration: Poor and Attention Span: Poor  Recall:  [employer] of Knowledge:  unable to assess  Language:  Good  Akathisia:  No  Handed:  Right  AIMS (if indicated):     Assets:  Desire for Improvement Social Support  ADL's:  Intact  Cognition:  WNL  Sleep:   poor     Treatment Plan  Summary: Daily contact with patient to assess and evaluate symptoms and progress in treatment and Medication management  Continue Lithium 668m bid for bipolar Start Lorazepam 15mtid for catatonia Start Saphris 1033m/L BID as needed for agitation. Start Abilify 5 mg daily for psychosis/delusions. Collaterall information from family ED re-evaluation to rule out medical cause of patient's current symptoms.  Disposition: Recommend psychiatric Inpatient admission when medically cleared.  AkiCorena PilgrimD 04/06/2016 10:47 AM

## 2016-04-07 MED ORDER — ARIPIPRAZOLE 5 MG PO TABS
5.0000 mg | ORAL_TABLET | Freq: Two times a day (BID) | ORAL | Status: DC
  Administered 2016-04-07 – 2016-04-09 (×4): 5 mg via ORAL
  Filled 2016-04-07 (×4): qty 1

## 2016-04-07 MED ORDER — STERILE WATER FOR INJECTION IJ SOLN
INTRAMUSCULAR | Status: AC
  Administered 2016-04-07: 10 mL
  Filled 2016-04-07: qty 10

## 2016-04-07 NOTE — ED Notes (Signed)
Hourly rounding reveals patient sleeping in room. No complaints, stable, in no acute distress. Q15 minute rounds and monitoring via Security Cameras to continue. 

## 2016-04-07 NOTE — ED Notes (Signed)
Patient getting agitated, reports that he needs to leave.

## 2016-04-07 NOTE — ED Notes (Signed)
Patient took all scheduled medications and is asking appropriate questions about his disposition.  Visiting with mother.  Calm and cooperative.

## 2016-04-07 NOTE — Progress Notes (Signed)
CSW contacted the following facilities to inquire about patient's referral: [facility], [facility], [facility], [facility], and [facility].   Patient declined at the following facilities: [facility] and [facility] (at capacity).   Following facilities requested patient's referral to be sent again: [facility], [facility] and [facility].   CSW refaxed patient's referral to: [facility], [facility] and [facility].   CSW will continue to follow up with other facilities.

## 2016-04-07 NOTE — ED Notes (Signed)
Brandon Fuentes has been pleasant and cooperative since arriving on the unit.  He was given a snack and a drink and oriented to his surroundings.  He came back from TCU with a hard covered book which was removed from his room.  He is currently visiting with his mother.

## 2016-04-07 NOTE — ED Notes (Signed)
Report to include situation, background, assessment and recommendations from [place] RN. Patient sleeping, respirations regular and unlabored. Q15 minute rounds and security camera observation to continue.   

## 2016-04-08 MED ORDER — STERILE WATER FOR INJECTION IJ SOLN
INTRAMUSCULAR | Status: AC
  Administered 2016-04-08: 1.2 mL
  Filled 2016-04-08: qty 10

## 2016-04-08 NOTE — ED Notes (Signed)
Hourly rounding reveals patient sleeping in room. No complaints, stable, in no acute distress. Q15 minute rounds and monitoring via Security Cameras to continue. 

## 2016-04-08 NOTE — ED Notes (Signed)
Hourly rounding reveals patient in room. No complaints, stable, in no acute distress. Q15 minute rounds and monitoring via Security Cameras to continue. 

## 2016-04-08 NOTE — ED Notes (Signed)
Hourly rounding reveals patient in rest room. No complaints, stable, in no acute distress. Q15 minute rounds and monitoring via Security Cameras to continue. 

## 2016-04-08 NOTE — Progress Notes (Signed)
Pt presents with flat affect and depressed mood on initial contact. A & O X3. Preoccupied about "what do I need to do to stay out of this hospital" on multiple interactions. Denies SI, HI, AVH and pain when assessed. Brightened up as shift progressed. Speech is soft, coherent and tangential.  Appears to be thought blocking at times, delayed responses during conversations. Medications administered as prescribed. Support and encouragement provided. Q 15 minutes checks continues without outburst or self harm gestures. POC maintained for safety and mood stability.

## 2016-04-08 NOTE — ED Notes (Signed)
Report to include situation, background, assessment and [facility]. Patient with visitor, respirations regular and unlabored. Q15 minute rounds and security camera observation to continue.

## 2016-04-08 NOTE — Consult Note (Signed)
[facility] Face-to-Face Psychiatry Consult   Reason for Consult: bizarre/disorganized behavior, depression,  Referring Physician:  EDP Patient Identification: Brandon Fuentes MRN:  [id number] Principal Diagnosis: Bipolar affective disorder, current episode mixed [facility]) Diagnosis:   Patient Active Problem List   Diagnosis Date Noted  . Bipolar affective disorder, current episode mixed ([facility]) [F31.60]     Priority: High  . Bipolar disorder ([facility]) [F31.9] 04/01/2016  . 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 Psychosis [F29] 04/01/2016    Total Time spent with patient: 25 minutes  Subjective:    disorganized and bizarre behavior.  Objective : Patient was seen, interviewed and chart reviewed and case discussed with treatment team. Patient  with  history of Bipolar disorder dating back to age 26. Patient is a poor historian who is selectively mute today. Patient now accepts medications but he remains depressed, bizarre, disorganized behavior, paranoid, selectively mute and restless. Patient has been observed  Pacing the unit up and down mutering to himself. However, he occasionally has period of lucidity. Patient will benefit from inpatient admission.  Past Psychiatric History: as above  Risk to Self: Suicidal Ideation: No Suicidal Intent: No Is patient at risk for suicide?: No Suicidal Plan?: No Access to Means: No What has been your use of drugs/alcohol within the last 12 months?: NA How many times?: 0 Other Self Harm Risks: NA Triggers for Past Attempts: None known Intentional Self Injurious Behavior: None Risk to Others: Homicidal Ideation: No Thoughts of Harm to Others: No Current Homicidal Intent: No Current Homicidal Plan: No Access to Homicidal Means: No Identified Victim: NA History of harm to others?: No Assessment of Violence: None Noted Violent Behavior Description: NA Does patient have access to weapons?: No Criminal Charges Pending?: No Does patient have a court date:  No Prior Inpatient Therapy: Prior Inpatient Therapy: Yes Prior Therapy Dates: 2011 Prior Therapy Facilty/Provider(s): [facility] Reason for Treatment: Bipolar Prior Outpatient Therapy: Prior Outpatient Therapy: Yes Prior Therapy Dates: 2017 Prior Therapy Facilty/Provider(s): Dr. Maggie Font Reason for Treatment: Med Management  Does patient have an ACCT team?: No Does patient have Intensive In-House Services?  : No Does patient have [facility] services? : No Does patient have P4CC services?: No  Past Medical History:  Past Medical History:  Diagnosis Date  . Bipolar 1 disorder ([facility])    History reviewed. No pertinent surgical history. Family History: No family history on file. Family Psychiatric  History:  Social History:  History  Alcohol Use No     History  Drug Use No    Social History   Social History  . Marital status: Single    Spouse name: N/A  . Number of children: N/A  . Years of education: N/A   Social History Main Topics  . Smoking status: Never Smoker  . Smokeless tobacco: Never Used  . Alcohol use No  . Drug use: No  . Sexual activity: Not Asked   Other Topics Concern  . None   Social History Narrative  . None   Additional Social History:    Allergies:   Allergies  Allergen Reactions  . Depakote [Divalproex Sodium] Other (See Comments)    Sleepiness, anger  . Zoloft [Sertraline Hcl]     anger    Labs:  No results found for this or any previous visit (from the past 48 hour(s)).  Current Facility-Administered Medications  Medication Dose Route Frequency Provider Last Rate Last Dose  . sterile water (preservative free) injection           .  ARIPiprazole (ABILIFY) tablet 5 mg  5 mg Oral BID Charm Rings, NP   5 mg at 04/07/16 2108  . asenapine (SAPHRIS) sublingual tablet 10 mg  10 mg Sublingual Q12H PRN Thedore Mins, MD   10 mg at 04/07/16 1437  . hydrOXYzine (ATARAX/VISTARIL) tablet 25 mg  25 mg Oral Q6H PRN Alvira Monday, MD      .  lithium carbonate capsule 600 mg  600 mg Oral BID WC Keymari Sato, MD   600 mg at 04/08/16 0859  . LORazepam (ATIVAN) injection 2 mg  2 mg Intramuscular BID Charm Rings, NP   2 mg at 04/08/16 0900  . LORazepam (ATIVAN) tablet 1 mg  1 mg Oral TID Thedore Mins, MD   1 mg at 04/07/16 2108  . ziprasidone (GEODON) injection 10 mg  10 mg Intramuscular BID Charm Rings, NP   10 mg at 04/08/16 0900   Current Outpatient Prescriptions  Medication Sig Dispense Refill  . lithium carbonate 300 MG capsule Take 600-900 mg by mouth 2 (two) times daily.    Marland Kitchen lithium carbonate 600 MG capsule Take 1 capsule (600 mg total) by mouth 2 (two) times daily. 60 capsule 0  . hydrOXYzine (ATARAX/VISTARIL) 25 MG tablet Take 1 tablet (25 mg total) by mouth every 6 (six) hours as needed for anxiety. 30 tablet 0    Musculoskeletal: Strength & Muscle Tone: within normal limits Gait & Station: normal Patient leans: N/A  Psychiatric Specialty Exam: Physical Exam  Psychiatric: His mood appears anxious. His speech is delayed and tangential. He is agitated and combative. Thought content is paranoid and delusional. Cognition and memory are normal. He expresses impulsivity.    Review of Systems  Constitutional: Positive for malaise/fatigue.  HENT: Negative.   Eyes: Negative.   Respiratory: Negative.   Cardiovascular: Negative.   Gastrointestinal: Negative.   Genitourinary: Negative.   Musculoskeletal: Negative.   Skin: Negative.   Neurological: Positive for weakness.  Endo/Heme/Allergies: Negative.   Psychiatric/Behavioral: Positive for depression and hallucinations. The patient is nervous/anxious and has insomnia.     Blood pressure (!) 83/49, pulse 74, temperature 98 F (36.7 C), temperature source Oral, resp. rate 18, SpO2 99 %.There is no height or weight on file to calculate BMI.  General Appearance: Casual  Eye Contact:  Poor  Speech:  Slow  Volume:  Decreased  Mood:  Irritable  Affect:  Blunt and  Constricted  Thought Process:  Disorganized  Orientation:  Other:  only to person and place  Thought Content:  Illogical, Delusions and Paranoid Ideation  Suicidal Thoughts:  No  Homicidal Thoughts:  No  Memory:  Immediate;   Fair Recent;   Fair Remote;   Fair  Judgement:  Impaired  Insight:  Shallow  Psychomotor Activity:  Restlessness  Concentration:  Concentration: Poor and Attention Span: Poor  Recall:  [employer] of Knowledge:  unable to assess  Language:  Good  Akathisia:  No  Handed:  Right  AIMS (if indicated):     Assets:  Desire for Improvement Social Support  ADL's:  Intact  Cognition:  WNL  Sleep:   poor     Treatment Plan Summary: Daily contact with patient to assess and evaluate symptoms and progress in treatment and Medication management  Continue Lithium 600mg  bid for bipolar Continue  Lorazepam 1mg  tid for catatonia Continue Saphris 10mg  S/L BID as needed for agitation. Increase  Abilify 5 mg to twice  daily for psychosis/delusions.  Disposition: Recommend psychiatric  Inpatient admission when medically cleared.  Thedore MinsAkintayo, Norita Meigs, MD 04/08/2016 12:33 PM

## 2016-04-09 ENCOUNTER — Encounter (HOSPITAL_COMMUNITY): Payer: Self-pay | Admitting: *Deleted

## 2016-04-09 ENCOUNTER — Inpatient Hospital Stay (HOSPITAL_COMMUNITY)
Admission: AD | Admit: 2016-04-09 | Discharge: 2016-04-16 | DRG: 885 | Disposition: A | Discharge status: Home / Self Care | Payer: 59 | Attending: Psychiatry | Admitting: Psychiatry

## 2016-04-09 DIAGNOSIS — Z833 Family history of diabetes mellitus: Secondary | ICD-10-CM | POA: Diagnosis not present

## 2016-04-09 DIAGNOSIS — Z888 Allergy status to other drugs, medicaments and biological substances status: Secondary | ICD-10-CM

## 2016-04-09 DIAGNOSIS — F3163 Bipolar disorder, current episode mixed, severe, without psychotic features: Principal | ICD-10-CM | POA: Diagnosis present

## 2016-04-09 DIAGNOSIS — Z79899 Other long term (current) drug therapy: Secondary | ICD-10-CM | POA: Diagnosis not present

## 2016-04-09 DIAGNOSIS — G4700 Insomnia, unspecified: Secondary | ICD-10-CM | POA: Diagnosis present

## 2016-04-09 DIAGNOSIS — F3164 Bipolar disorder, current episode mixed, severe, with psychotic features: Secondary | ICD-10-CM | POA: Diagnosis not present

## 2016-04-09 DIAGNOSIS — F29 Unspecified psychosis not due to a substance or known physiological condition: Secondary | ICD-10-CM | POA: Diagnosis not present

## 2016-04-09 DIAGNOSIS — Z9114 Patient's other noncompliance with medication regimen: Secondary | ICD-10-CM | POA: Diagnosis not present

## 2016-04-09 DIAGNOSIS — F061 Catatonic disorder due to known physiological condition: Secondary | ICD-10-CM | POA: Diagnosis not present

## 2016-04-09 MED ORDER — ACETAMINOPHEN 325 MG PO TABS: 650.0000 mg | ORAL_TABLET | Freq: Four times a day (QID) | ORAL | Status: DC | PRN

## 2016-04-09 MED ORDER — OLANZAPINE 10 MG PO TBDP: 10.0000 mg | ORAL_TABLET | Freq: Three times a day (TID) | ORAL | Status: DC | PRN

## 2016-04-09 MED ORDER — LORAZEPAM 1 MG PO TABS: 1.0000 mg | ORAL_TABLET | ORAL | Status: DC | PRN

## 2016-04-09 MED ORDER — LORAZEPAM 1 MG PO TABS
1.0000 mg | ORAL_TABLET | Freq: Two times a day (BID) | ORAL | Status: DC
  Administered 2016-04-09 – 2016-04-13 (×8): 1 mg via ORAL
  Filled 2016-04-09 (×8): qty 1

## 2016-04-09 MED ORDER — HYDROXYZINE HCL 25 MG PO TABS: 25.0000 mg | ORAL_TABLET | Freq: Four times a day (QID) | ORAL | Status: DC | PRN

## 2016-04-09 MED ORDER — AMANTADINE HCL 100 MG PO CAPS
100.0000 mg | ORAL_CAPSULE | Freq: Two times a day (BID) | ORAL | Status: DC
  Administered 2016-04-09: 100 mg via ORAL
  Filled 2016-04-09: qty 1

## 2016-04-09 MED ORDER — MAGNESIUM HYDROXIDE 400 MG/5ML PO SUSP: 30.0000 mL | Freq: Every day | ORAL | Status: DC | PRN

## 2016-04-09 MED ORDER — AMANTADINE HCL 100 MG PO CAPS
100.0000 mg | ORAL_CAPSULE | Freq: Two times a day (BID) | ORAL | Status: DC
Start: 2016-04-09 — End: 2016-04-16
  Administered 2016-04-09 – 2016-04-16 (×14): 100 mg via ORAL
  Filled 2016-04-09 (×19): qty 1

## 2016-04-09 MED ORDER — ZIPRASIDONE MESYLATE 20 MG IM SOLR: 20.0000 mg | INTRAMUSCULAR | Status: DC | PRN

## 2016-04-09 MED ORDER — LORAZEPAM 1 MG PO TABS: 1.0000 mg | ORAL_TABLET | Freq: Two times a day (BID) | ORAL | Status: DC

## 2016-04-09 MED ORDER — ARIPIPRAZOLE 5 MG PO TABS
5.0000 mg | ORAL_TABLET | Freq: Two times a day (BID) | ORAL | Status: DC
  Administered 2016-04-09 – 2016-04-10 (×2): 5 mg via ORAL
  Filled 2016-04-09 (×7): qty 1

## 2016-04-09 MED ORDER — ALUM & MAG HYDROXIDE-SIMETH 200-200-20 MG/5ML PO SUSP: 30.0000 mL | ORAL | Status: DC | PRN

## 2016-04-09 MED ORDER — LITHIUM CARBONATE 300 MG PO CAPS
600.0000 mg | ORAL_CAPSULE | Freq: Two times a day (BID) | ORAL | Status: DC
Start: 2016-04-10 — End: 2016-04-16
  Administered 2016-04-10 – 2016-04-16 (×13): 600 mg via ORAL
  Filled 2016-04-09 (×18): qty 2

## 2016-04-09 MED ORDER — ASENAPINE MALEATE 5 MG SL SUBL
10.0000 mg | SUBLINGUAL_TABLET | Freq: Two times a day (BID) | SUBLINGUAL | Status: DC | PRN
  Administered 2016-04-09: 10 mg via SUBLINGUAL
  Filled 2016-04-09: qty 2

## 2016-04-09 NOTE — ED Notes (Signed)
Hourly rounding reveals patient sleeping in room. No complaints, stable, in no acute distress. Q15 minute rounds and monitoring via Security Cameras to continue. 

## 2016-04-09 NOTE — ED Notes (Signed)
Hourly rounding reveals patient sitting in room after shower. No complaints, stable, in no acute distress. Q15 minute rounds and monitoring via [employer] Cam[employer] to continue.

## 2016-04-09 NOTE — Tx Team (Signed)
Initial Treatment Plan 04/09/2016 6:27 PM Brandon AuerSherwood Windish WGN:[id number]    PATIENT STRESSORS: Medication change or noncompliance   PATIENT STRENGTHS: Motivation for treatment/growth Physical Health Supportive family/friends   PATIENT IDENTIFIED PROBLEMS: Anger "I want to learn how to live with anger"  Anxiety "I want to learn tolerance of being in an enviornment that is different  Medication non-compliance "I would start some and fall off some.  I didn't like the side effects"                 DISCHARGE CRITERIA:  Improved stabilization in mood, thinking, and/or behavior Motivation to continue treatment in a less acute level of care Need for constant or close observation no longer present Verbal commitment to aftercare and medication compliance  PRELIMINARY DISCHARGE PLAN: Outpatient therapy Return to previous living arrangement  PATIENT/FAMILY INVOLVEMENT: This treatment plan has been presented to and reviewed with the patient, Brandon Fuentes, and/or family member.  The patient and family have been given the opportunity to ask questions and make suggestions.  Hoover BrownsJones, Naiomy Watters Howard, RN 04/09/2016, 6:27 PM

## 2016-04-09 NOTE — BH Assessment (Addendum)
[facility] Assessment Progress Note  Per Thedore MinsMojeed Akintayo, MD, this pt requires psychiatric hospitalization.  Malva LimesLinsey Strader, RN, [id number]; she will call when they are ready to receive pt.  Pt presents under IVC, and [facility].  Pt's nurse, Rudean HittDawnaly, has been notified, and agrees to call report to [phone] when the time comes.  Pt is to be transported via [employer].   Doylene Canninghomas Teddi Badalamenti, MA Triage Specialist [phone]  Addendum:  Per Richelle ItoLinsey, [facility] pt at 16:00.  Dawnaly has been notified.  Doylene Canninghomas Erryn Dickison, MA Triage Specialist [phone]

## 2016-04-09 NOTE — Consult Note (Signed)
[facility]  04/09/2016 10:33 AM Brandon AuerSherwood Fuentes  MRN:  [id number] Subjective:  "I don't my medications are working for me, I need Help.''  Objective: Patient was seen, interviewed, chart reviewed and case discussed with treatment team. Patient is communicating better today but still appears depressed, withdrawn, sometimes acts confused and believes that people are trying to get him. Patient still paces the hallway and has been observed talking to himself. He keeps saying ''I need help, my medications are not working.'' Patient denies SI/HI, drugs and alcohol abuse.  Principal Problem: Bipolar affective disorder, current episode mixed (HCC) Diagnosis:   Patient Active Problem List   Diagnosis Date Noted  . Bipolar affective disorder, current episode mixed (HCC) [F31.60]     Priority: High  . Bipolar disorder (HCC) [F31.9] 04/01/2016  . 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 Psychosis [F29] 04/01/2016   Total Time spent with patient: 25 minutes  Past Psychiatric History: as above  Past Medical History:  Past Medical History:  Diagnosis Date  . Bipolar 1 disorder (HCC)    History reviewed. No pertinent surgical history. Family History: No family history on file. Family Psychiatric  History:  Social History:  History  Alcohol Use No     History  Drug Use No    Social History   Social History  . Marital status: Single    Spouse name: N/A  . Number of children: N/A  . Years of education: N/A   Social History Main Topics  . Smoking status: Never Smoker  . Smokeless tobacco: Never Used  . Alcohol use No  . Drug use: No  . Sexual activity: Not Asked   Other Topics Concern  . None   Social History Narrative  . None    Sleep: Fair  Appetite:  Fair  Current Medications: Current Facility-Administered Medications  Medication Dose Route Frequency Provider Last Rate Last Dose  . amantadine (SYMMETREL) capsule 100 mg  100 mg Oral BID Bryanna Yim, MD       . ARIPiprazole (ABILIFY) tablet 5 mg  5 mg Oral BID Charm RingsJamison Y Lord, NP   5 mg at 04/09/16 0946  . asenapine (SAPHRIS) sublingual tablet 10 mg  10 mg Sublingual Q12H PRN Thedore MinsMojeed Brynnly Bonet, MD   10 mg at 04/07/16 1437  . hydrOXYzine (ATARAX/VISTARIL) tablet 25 mg  25 mg Oral Q6H PRN Alvira MondayErin Schlossman, MD      . lithium carbonate capsule 600 mg  600 mg Oral BID WC Thedore MinsMojeed Marcellino Fidalgo, MD   600 mg at 04/09/16 0816  . LORazepam (ATIVAN) tablet 1 mg  1 mg Oral BID Thedore MinsMojeed Ji Fairburn, MD       Current Outpatient Prescriptions  Medication Sig Dispense Refill  . lithium carbonate 300 MG capsule Take 600-900 mg by mouth 2 (two) times daily.    Marland Kitchen. lithium carbonate 600 MG capsule Take 1 capsule (600 mg total) by mouth 2 (two) times daily. 60 capsule 0  . hydrOXYzine (ATARAX/VISTARIL) 25 MG tablet Take 1 tablet (25 mg total) by mouth every 6 (six) hours as needed for anxiety. 30 tablet 0    Lab Results: No results found for this or any previous visit (from the past 48 hour(s)).  Blood Alcohol level:  Lab Results  Component Value Date   ETH <5 04/05/2016   ETH <5 03/30/2016    Physical Findings: AIMS:  , ,  ,  ,    CIWA:    COWS:     Musculoskeletal: Strength &  Muscle Tone: within normal limits Gait & Station: normal Patient leans: N/A  Psychiatric Specialty Exam: Physical Exam  Psychiatric: Judgment normal. His affect is blunt. His speech is delayed. He is slowed and withdrawn. Thought content is paranoid. Cognition and memory are normal. He exhibits a depressed mood.    Review of Systems  Constitutional: Positive for malaise/fatigue.  HENT: Negative.   Eyes: Negative.   Respiratory: Negative.   Cardiovascular: Negative.   Gastrointestinal: Negative.   Genitourinary: Negative.   Musculoskeletal: Negative.   Skin: Negative.   Neurological: Negative.   Endo/Heme/Allergies: Negative.   Psychiatric/Behavioral: Positive for depression. The patient is nervous/anxious.     Blood pressure  140/83, pulse 103, temperature 98.2 F (36.8 C), temperature source Oral, resp. rate 16, SpO2 100 %.There is no height or weight on file to calculate BMI.  General Appearance: Casual  Eye Contact:  Minimal  Speech:  Clear and Coherent and Slow  Volume:  Decreased  Mood:  Depressed and Dysphoric  Affect:  Constricted  Thought Process:  Disorganized  Orientation:  Other:  only to place and person  Thought Content:  Delusions, Paranoid Ideation and Rumination  Suicidal Thoughts:  No  Homicidal Thoughts:  No  Memory:  Immediate;   Fair Recent;   Fair Remote;   Fair  Judgement:  Impaired  Insight:  Shallow  Psychomotor Activity:  Psychomotor Retardation  Concentration:  Concentration: Poor and Attention Span: Poor  Recall:  Fair  Fund of Knowledge:  Fair  Language:  Good  Akathisia:  No  Handed:  Right  AIMS (if indicated):     Assets:  Manufacturing systems engineer Social Support  ADL's:  Intact  Cognition:  WNL  Sleep:   fair       Treatment Plan Summary: Daily contact with patient to assess and evaluate symptoms and progress in treatment and Medication management  Continue Lithium 600mg  bid for bipolar Continue  Lorazepam 1mg  tid for catatonia Continue Saphris 10mg  S/L BID as needed for agitation. Continue  Abilify 5 mg  twice  daily for psychosis/delusions.  Disposition: Recommend psychiatric Inpatient admission when medically cleared.    Thedore Mins, MD 04/09/2016, 10:33 AM

## 2016-04-09 NOTE — ED Notes (Signed)
Hourly rounding reveals patient in sleeping room. No complaints, stable, in no acute distress. Q15 minute rounds and monitoring via Security Cam[employer] to continue. 

## 2016-04-09 NOTE — Progress Notes (Addendum)
DAR note:  Pt was in the hallway upon initial approach.  Pt presents with labile affect and mood.  He was very pleasant and appropriate at the beginning of the shift.  He reported he was here because "I stopped taking my medications."  He denied having hallucinations and did not appear to be responding at that time.  He denies SI/HI, denies pain.  At bedtime, pt appeared to be responding to internal stimuli.  He was sitting up in bed in the dark looking at the wall and having a conversation when nobody else was speaking, making hand motions at the time.  MHT reported he was making hyperreligious statements and was paranoid about Q15 minute safety checks.  He was agitated, defensive, oppositional, paranoid, and resistant to care.  Pt was argumentative with staff and kept trying to interrupt Clinical research associatewriter while Clinical research associatewriter was speaking to pt.  He asked "why all of a sudden is it that I have to come take meds now when everybody else is asleep?"  It was explained to pt that the medication is for agitation as needed.  He was hesitant to take medication when it was offered but he agreed to do so.  PRN medication for agitation was administered.  Pt went to his room after medication administration; he was agitated with writer at the time.  Pt denies SI/HI, denies hallucinations, denies pain.  Pt has been visible in milieu and he attended evening group.  He verbally contracted for safety earlier tonight.  On-call provider was notified of pt's behaviors and pt was placed on agitation protocol.  Will continue to monitor and assess for safety.  Q15 minute safety checks maintained.  Staff made aware that pt is unpredictable and easily agitated.  He is safe on the unit at this time.

## 2016-04-09 NOTE — Progress Notes (Signed)
[facility] Group Notes:  (Nursing/MHT/Case Management/Adjunct)  Date:  04/09/2016  Time:  9:34 PM  Type of Therapy:  Psychoeducational Skills  Participation Level:  Active  Participation Quality:  Appropriate  Affect:  Appropriate  Cognitive:  Appropriate  Insight:  Appropriate  Engagement in Group:  Engaged  Modes of Intervention:  Education  Summary of Progress/Problems: Patient states that he spent his day "settling" into the routine on the hallway. He also stated that he was grateful for God since he would like to work on making people smile. As for the theme of the day, his wellness strategy will be to be more "consistent" with his medication in that he has a habit of not taking the medication once he feels better.  Hazle CocaGOODMAN, Ari Bernabei S 04/09/2016, 9:34 PM

## 2016-04-09 NOTE — Care Management (Signed)
CM reviewed record awaiting inpatient placement.

## 2016-04-09 NOTE — Progress Notes (Signed)
04/09/16 1400:  LRT introduced self to pt and offered activities.  Pt and LRT played Jenga and [employer] in the dayroom.  Pt was very focused on religion, talking about how God has the last say so over everything and people should be more focused on God.  Pt was pleasant during activities.  Caroll RancherMarjette Danzel Marszalek, LRT/CTRS

## 2016-04-09 NOTE — ED Notes (Signed)
[facility]  Left the unit ambulatory with [employer].  All belongings given to the transporters.  He has been pleasant and cooperative this shift.

## 2016-04-10 ENCOUNTER — Encounter (HOSPITAL_COMMUNITY): Payer: Self-pay | Admitting: Psychiatry

## 2016-04-10 DIAGNOSIS — F061 Catatonic disorder due to known physiological condition: Secondary | ICD-10-CM

## 2016-04-10 DIAGNOSIS — F3163 Bipolar disorder, current episode mixed, severe, without psychotic features: Principal | ICD-10-CM

## 2016-04-10 DIAGNOSIS — Z79899 Other long term (current) drug therapy: Secondary | ICD-10-CM

## 2016-04-10 DIAGNOSIS — F29 Unspecified psychosis not due to a substance or known physiological condition: Secondary | ICD-10-CM

## 2016-04-10 DIAGNOSIS — Z888 Allergy status to other drugs, medicaments and biological substances status: Secondary | ICD-10-CM

## 2016-04-10 MED ORDER — LORAZEPAM 2 MG/ML IJ SOLN: 2.0000 mg | Freq: Two times a day (BID) | INTRAMUSCULAR | Status: DC | PRN

## 2016-04-10 MED ORDER — LORAZEPAM 2 MG/ML IJ SOLN: 1.0000 mg | Freq: Two times a day (BID) | INTRAMUSCULAR | Status: DC | PRN

## 2016-04-10 MED ORDER — LORAZEPAM 1 MG PO TABS: 2.0000 mg | ORAL_TABLET | Freq: Two times a day (BID) | ORAL | Status: DC | PRN

## 2016-04-10 MED ORDER — LORAZEPAM 1 MG PO TABS: 1.0000 mg | ORAL_TABLET | Freq: Two times a day (BID) | ORAL | Status: DC | PRN

## 2016-04-10 MED ORDER — ARIPIPRAZOLE 5 MG PO TABS
5.0000 mg | ORAL_TABLET | Freq: Every day | ORAL | Status: DC
  Filled 2016-04-10: qty 1

## 2016-04-10 MED ORDER — ZOLPIDEM TARTRATE 5 MG PO TABS: 5.0000 mg | ORAL_TABLET | Freq: Every evening | ORAL | Status: DC | PRN

## 2016-04-10 NOTE — Progress Notes (Signed)
Recreation Therapy Notes  Date: 04/10/16 Time: 1000 Location: 500 Hall Dayroom  Group Topic: Communication  Goal Area(s) Addresses:  Patient will effectively communicate with peers in group.  Patient will verbalize benefit of healthy communication. Patient will verbalize positive effect of healthy communication on post d/c goals.  Patient will identify communication techniques that made activity effective for group.   Behavioral Response: Engaged  Intervention:  Chairs, stopwatch, 3 small beach balls   Activity: Group Juggle.  Patients were placed in a circle.  Patients were given one small beach ball to toss back and forth between each other.  After some time, LRT would add another ball to the mix.  Patients were to now keep two balls moving within the circle.  After more time had passed, a third ball was added to the mix.  The balls could be bounced off the floor but could not come to a stop.  While the patients are tossing the balls within the circle, LRT is timing how long it takes for the balls to come to a complete stop.  If the balls come to a stop, LRT resets the the.  Education: Communication, Discharge Planning  Education Outcome: Acknowledges understanding/In group clarification offered/Needs additional education.   Clinical Observations/Feedback: Pt was able to focus on the activity.  Pt wasn't being hyper religious.  Pt was engaged and smiling throughout group.  Pt was social with peers.  Pt left early and did not return.  Caroll RancherMarjette Darelle Kings, LRT/CTRS         Caroll RancherLindsay, Kaliana Albino A 04/10/2016 11:54 AM

## 2016-04-10 NOTE — H&P (Signed)
Psychiatric Admission Assessment Adult  Patient Identification: Brandon Fuentes MRN:  [id number] Date of Evaluation:  04/10/2016 Chief Complaint: Patient states that ' I am anxious.'   Principal Diagnosis: Bipolar disorder, curr episode mixed, severe, w/o psychotic features (HCC) Diagnosis:   Patient Active Problem List   Diagnosis Date Noted  . Bipolar disorder, curr episode mixed, severe, w/o psychotic features (HCC) [F31.63] 04/10/2016  . 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 Psychosis [F29] 04/01/2016   History of Present Illness: Brandon Fuentes is a 59 y old AAM, who is single , on SSD, who lives with his mother in [place] , [place], relocated from [place] . Pt presented with worsening mood sx as well as anxiety sx to the [facility].  Patient seen and chart reviewed.Discussed patient with treatment team. Pt today seen as pleasant , does have pressured speech , on and off anxiety /mood lability on the unit. Pt however was able to sit down and communicate with Clinical research associate and answer all questions appropriately. Pt reports he was on depakote and lithium before , however he has not been taking his medications on a regular basis and hence started feeling anxious again.  Per patient he feels anxious and somewhat depressed often. Pt did not report any paranoia or other psychotic sx to Clinical research associate . However per initial notes in EHR - pt reported paranoia while in ED.  Attempted to contact mother - Brandon Fuentes - no response.  Per review of previous notes in EHR - Pt had several admissions to IP units . Pt also has a hx of NMS while at [employer]. Pt did well on combination of depakote and Lithium along with klonazepam.   Associated Signs/Symptoms: Depression Symptoms:  anxiety, (Hypo) Manic Symptoms:  Impulsivity, Anxiety Symptoms:  Excessive Worry, Psychotic Symptoms:  denied paranoia to writer PTSD Symptoms: Negative Total Time spent with patient: 45 minutes  Past Psychiatric History: Patient as per  review of EHR has had several IP admissions , [facility] in 2014, [facility]- 2016, 2014, [place] 2011. Pt denies suicide attempts.   Is the patient at risk to self? Yes.    Has the patient been a risk to self in the past 6 months? No.  Has the patient been a risk to self within the distant past? Yes.    Is the patient a risk to others? Yes.    Has the patient been a risk to others in the past 6 months? No.  Has the patient been a risk to others within the distant past? Yes.     Prior Inpatient Therapy:   Prior Outpatient Therapy:    Alcohol Screening: 1. How often do you have a drink containing alcohol?: Never 9. Have you or someone else been injured as a result of your drinking?: No 10. Has a relative or friend or a doctor or another health worker been concerned about your drinking or suggested you cut down?: No Alcohol Use Disorder Identification Test Final Score (AUDIT): 0 Brief Intervention: AUDIT score less than 7 or less-screening does not suggest unhealthy drinking-brief intervention not indicated Substance Abuse History in the last 12 months:  No. Consequences of Substance Abuse: Negative Previous Psychotropic Medications: Yes  depakote ( ADRS), Vistaril , Ritalin, Ambien, ativan, abilify  Psychological Evaluations: Yes  Past Medical History:  Past Medical History:  Diagnosis Date  . Bipolar 1 disorder (HCC)    History reviewed. No pertinent surgical history. Family History:  Family History  Problem Relation Age of Onset  .  Diabetes Mother    Family Psychiatric  History: denies hx of mental illness /substance abuse . Tobacco Screening: Have you used any form of tobacco in the last 30 days? (Cigarettes, Smokeless Tobacco, Cigars, and/or Pipes): No Social History: Patient per review of EHR was born in [place] , raised in [place]. Pt went to less than a semester at [employer] , [place]. Pt used to work on customer  service , but left  Few weeks ago due to stress. History  Alcohol Use No     History  Drug Use No    Additional Social History:      Pain Medications: Denied abuse Prescriptions: Denied abuse Over the Counter: Denied abuse History of alcohol / drug use?: No history of alcohol / drug abuse                    Allergies:   Allergies  Allergen Reactions  . Depakote [Divalproex Sodium] Other (See Comments)    Sleepiness, anger  . Zoloft [Sertraline Hcl]     anger   Lab Results: No results found for this or any previous visit (from the past 48 hour(s)).  Blood Alcohol level:  Lab Results  Component Value Date   ETH <5 04/05/2016   ETH <5 03/30/2016    Metabolic Disorder Labs:  Lab Results  Component Value Date   HGBA1C (H) 07/14/2009    6.1 (NOTE)                                                                       According to the ADA Clinical Practice Recommendations for 2011, when HbA1c is used as a screening test:   >=6.5%   Diagnostic of Diabetes Mellitus           (if abnormal result  is confirmed)  5.7-6.4%   Increased risk of developing Diabetes Mellitus  References:Diagnosis and Classification of Diabetes Mellitus,Diabetes Care,2011,34(Suppl 1):S62-S69 and Standards of Medical Care in         Diabetes - 2011,Diabetes Care,2011,34  (Suppl 1):S11-S61.   MPG 128 (H) 07/14/2009   No results found for: PROLACTIN Lab Results  Component Value Date   CHOL  07/14/2009    152        ATP III CLASSIFICATION:  <200     mg/dL   Desirable  409-811200-239  mg/dL   Borderline High  >=914>=240    mg/dL   High          TRIG 71 07/14/2009   HDL 33 (L) 07/14/2009   CHOLHDL 4.6 07/14/2009   VLDL 14 07/14/2009   LDLCALC  07/14/2009    105        Total Cholesterol/HDL:CHD Risk Coronary Heart Disease Risk Table                     Men   Women  1/2 Average Risk   3.4   3.3  Average Risk       5.0   4.4  2 X Average Risk   9.6   7.1  3 X Average Risk  23.4   11.0        Use  the calculated  Patient Ratio above and the CHD Risk Table to determine the patient's CHD Risk.        ATP III CLASSIFICATION (LDL):  <100     mg/dL   Optimal  098-119  mg/dL   Near or Above                    Optimal  130-159  mg/dL   Borderline  147-829  mg/dL   High  >562     mg/dL   Very High    Current Medications: Current Facility-Administered Medications  Medication Dose Route Frequency Provider Last Rate Last Dose  . acetaminophen (TYLENOL) tablet 650 mg  650 mg Oral Q6H PRN Charm Rings, NP      . alum & mag hydroxide-simeth (MAALOX/MYLANTA) 200-200-20 MG/5ML suspension 30 mL  30 mL Oral Q4H PRN Charm Rings, NP      . amantadine (SYMMETREL) capsule 100 mg  100 mg Oral BID Charm Rings, NP   100 mg at 04/10/16 0756  . ARIPiprazole (ABILIFY) tablet 5 mg  5 mg Oral QHS Michaiah Maiden, MD      . asenapine (SAPHRIS) sublingual tablet 10 mg  10 mg Sublingual Q12H PRN Charm Rings, NP   10 mg at 04/09/16 2216  . hydrOXYzine (ATARAX/VISTARIL) tablet 25 mg  25 mg Oral Q6H PRN Charm Rings, NP      . lithium carbonate capsule 600 mg  600 mg Oral BID WC Charm Rings, NP   600 mg at 04/10/16 0756  . LORazepam (ATIVAN) tablet 1 mg  1 mg Oral BID PRN Jomarie Longs, MD       Or  . LORazepam (ATIVAN) injection 1 mg  1 mg Intramuscular BID PRN Jomarie Longs, MD      . LORazepam (ATIVAN) tablet 1 mg  1 mg Oral BID Charm Rings, NP   1 mg at 04/10/16 0755  . magnesium hydroxide (MILK OF MAGNESIA) suspension 30 mL  30 mL Oral Daily PRN Charm Rings, NP      . zolpidem (AMBIEN) tablet 5 mg  5 mg Oral QHS PRN Jomarie Longs, MD       PTA Medications: Prescriptions Prior to Admission  Medication Sig Dispense Refill Last Dose  . hydrOXYzine (ATARAX/VISTARIL) 25 MG tablet Take 1 tablet (25 mg total) by mouth every 6 (six) hours as needed for anxiety. 30 tablet 0   . lithium carbonate 300 MG capsule Take 600-900 mg by mouth 2 (two) times daily.   04/04/2016 at Unknown time  .  lithium carbonate 600 MG capsule Take 1 capsule (600 mg total) by mouth 2 (two) times daily. 60 capsule 0     Musculoskeletal: Strength & Muscle Tone: within normal limits Gait & Station: normal Patient leans: N/A  Psychiatric Specialty Exam: Physical Exam  Review of Systems  Psychiatric/Behavioral: The patient is nervous/anxious.   All other systems reviewed and are negative.   Blood pressure 119/76, pulse (!) 107, temperature 98.5 F (36.9 C), temperature source Oral, resp. rate 16, height 6\' 1"  (1.854 m), weight (!) 138.3 kg (305 lb), SpO2 100 %.Body mass index is 40.24 kg/m.  General Appearance: Fairly Groomed  Eye Contact:  Fair  Speech:  Pressured  Volume:  Normal  Mood:  Anxious  Affect:  Congruent  Thought Process:  Goal Directed and Descriptions of Associations: Circumstantial  Orientation:  Full (Time, Place, and Person)  Thought Content:  Rumination  Suicidal Thoughts:  No  Homicidal Thoughts:  No  Memory:  Immediate;   Fair Recent;   Fair Remote;   Fair  Judgement:  Impaired  Insight:  Shallow  Psychomotor Activity:  Restlessness  Concentration:  Concentration: Fair and Attention Span: Fair  Recall:  [employer] of Knowledge:  Fair  Language:  Fair  Akathisia:  No  Handed:  Right  AIMS (if indicated):     Assets:  Communication Skills Desire for Improvement  ADL's:  Intact  Cognition:  WNL  Sleep:  Number of Hours: 6.5    Treatment Plan Summary:Patient seen today as pleasant , reports anxiety and mood lability, per review of EHR does have a hx of aggression. Pt with hx of NMS , when explained his risk is higher on a combination of antipsychotics along with Lithium- prefers to be on Lithium. And more over Abilify was tried in the past - however he cannot give explanation of what response he had. Discussed adding another mood stabilizer, he reports he tried tegretol and lamictal , offered trilpetal, pt wants to read and make a decision about it. Provided  handouts . Daily contact with patient to assess and evaluate symptoms and progress in treatment, Medication management and Plan see below Patient will benefit from inpatient treatment and stabilization.  Estimated length of stay is 5-7 days.  Reviewed past medical records,treatment plan.  Will continue Li 600 mg po bid for mood sx. Li level reviewed - therapeutic - 0.62. Will discontinue Abilify - since pt with hx of NMS , and when administered along with Dierdre Searles , risk may be higher. Will continue Ativan 1 mg po bid, 2 mg po bid prn for anxiety sx. Will start a trial of Ambien 5 mg po qhs for sleep problems. Will continue to monitor vitals ,medication compliance and treatment side effects while patient is here.  Will monitor for medical issues as well as call consult as needed.  Reviewed labs cbc - wnl, k+ - low , will repeat , uds - negative , will get tsh, lipid panel, hba1c, pl. Will get EKG for qtc monitoring. CSW will start working on disposition.  Patient to participate in therapeutic milieu .      Observation Level/Precautions:  15 minute checks    Psychotherapy:  Individual and group therapy     Consultations:  CSW  Discharge Concerns:  Stability and safety       Physician Treatment Plan for Primary Diagnosis: Bipolar disorder, curr episode mixed, severe, w/o psychotic features (HCC) Long Term Goal(s): Improvement in symptoms so as ready for discharge  Short Term Goals: Ability to verbalize feelings will improve and Compliance with prescribed medications will improve  Physician Treatment Plan for Secondary Diagnosis: Principal Problem:   Bipolar disorder, curr episode mixed, severe, w/o psychotic features (HCC)  Long Term Goal(s): Improvement in symptoms so as ready for discharge  Short Term Goals: Ability to verbalize feelings will improve and Compliance with prescribed medications will improve  I certify that inpatient services furnished can reasonably be expected to  improve the patient's condition.    [place], MD 3/13/20182:07 PM

## 2016-04-10 NOTE — Progress Notes (Signed)
DAR NOTE: Patient presents with anxious affect and mood.  Denies pain, auditory and visual hallucinations but is observed responding to internal stimuli.  Patient is preoccupied and is hyper religious.  Maintained on routine safety checks.  Medications given as prescribed.  Support and encouragement offered as needed.  Attended group and participated.  Patient observed socializing with peers in the dayroom.  Offered no complaint.

## 2016-04-10 NOTE — Progress Notes (Signed)
Recreation Therapy Notes  INPATIENT RECREATION THERAPY ASSESSMENT  Patient Details Name: Lise AuerSherwood Wantz MRN: [id number] DOB: 03-21-1990 Today's Date: 04/10/2016  Patient Stressors: Other (Comment) (Not taking medicine over a period of time)  Pt stated he was here because he wasn't taking his medications and wasn't feeling well.  Coping Skills:   Arguments, Avoidance, Exercise, Talking, Music, Other (Comment) (Writing, reading)  Personal Challenges: Anger, Communication, Concentration, Decision-Making, Relationships, Stress Management, Time Management, Trusting Others, Work Nutritional therapisterformance  Leisure Interests (2+):  Individual - Writing, Music - Singing, BankerCommunity - Shopping mall, Garment/textile technologistCommunity - Agricultural consultantVolunteer (Comment)  Biochemist, clinicalAwareness of Community Resources:  Yes  Community Resources:  Public affairs consultantestaurants, Other (Comment) ([employer], MusicianGold's Gym)  Current Use: No  Patient Strengths:  Communication, being able to relate  Patient Identified Areas of Improvement:  Consistancy, Mood  Current Recreation Participation:  Everyday  Patient Goal for Hospitalization:  "Focus on skills, get a plan going to implement skills learned here to live my best life"  [place]  [place]idence[place]  Current [place] (including self-harm):  No  Current HI:  No  Consent to Intern Participation: N/A   Caroll RancherMarjette Navpreet Szczygiel, LRT/CTRS  Caroll RancherLindsay, Jaeshawn Silvio A 04/10/2016, 12:30 PM

## 2016-04-10 NOTE — [facility] Suicide Risk Assessment (Signed)
[facility]   Nursing information obtained from:  Patient Demographic factors:  Male, Adolescent or young adult Current Mental Status:  NA Loss Factors:  NA Historical Factors:  Family history of mental illness or substance abuse Risk Reduction Factors:  Living with another person, especially a relative  Total Time spent with patient: 20 minutes Principal Problem: Bipolar disorder, curr episode mixed, severe, w/o psychotic features (HCC) Diagnosis:   Patient Active Problem List   Diagnosis Date Noted  . Bipolar disorder, curr episode mixed, severe, w/o psychotic features (HCC) [F31.63] 04/10/2016  . 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 Psychosis [F29] 04/01/2016   Subjective Data: Please see H&P.   Continued Clinical Symptoms:  Alcohol Use Disorder Identification Test Final Score (AUDIT): 0 The "Alcohol Use Disorders Identification Test", Guidelines for Use in Primary Care, Second Edition.  World Science writerHealth Organization [facility](WHO). Score between 0-7:  no or low risk or alcohol related problems. Score between 8-15:  moderate risk of alcohol related problems. Score between 16-19:  high risk of alcohol related problems. Score 20 or above:  warrants further diagnostic evaluation for alcohol dependence and treatment.   CLINICAL FACTORS:   Previous Psychiatric Diagnoses and Treatments   Musculoskeletal: Strength & Muscle Tone: within normal limits Gait & Station: normal Patient leans: N/A  Psychiatric Specialty Exam: Physical Exam  ROS  Blood pressure 119/76, pulse (!) 107, temperature 98.5 F (36.9 C), temperature source Oral, resp. rate 16, height 6\' 1"  (1.854 m), weight (!) 138.3 kg (305 lb), SpO2 100 %.Body mass index is 40.24 kg/m.            Please see H&P.                                               COGNITIVE FEATURES THAT CONTRIBUTE TO RISK:  Closed-mindedness, Polarized thinking and Thought constriction (tunnel  vision)    SUICIDE RISK:   Mild:  Suicidal ideation of limited frequency, intensity, duration, and specificity.  There are no identifiable plans, no associated intent, mild dysphoria and related symptoms, good self-control (both objective and subjective assessment), few other risk factors, and identifiable protective factors, including available and accessible social support.  PLAN OF CARE: Please see H&P.   I certify that inpatient services furnished can reasonably be expected to improve the patient's condition.   Karilynn Carranza, MD 04/10/2016, 2:06 PM

## 2016-04-10 NOTE — Progress Notes (Signed)
[facility] Group Notes:  (Nursing/MHT/Case Management/Adjunct)  Date:  04/10/2016  Time:  9:42 PM  Type of Therapy:  Psychoeducational Skills  Participation Level:  Minimal  Participation Quality:  Attentive  Affect:  Appropriate  Cognitive:  Appropriate  Insight:  Appropriate  Engagement in Group:  Limited  Modes of Intervention:  Education  Summary of Progress/Problems: The patient described his day as having been "allright". He would not go into further detail. The patient's goal for tomorrow is to work on his anger and to have more "consistency" in his life.   Hazle CocaGOODMAN, Daivd Fredericksen S 04/10/2016, 9:42 PM

## 2016-04-10 NOTE — [facility] Group Notes (Signed)
[facility] LCSW Group Therapy  04/10/2016 1:15 pm  Type of Therapy: Process Group Therapy  Participation Level:  Active  Participation Quality:  Appropriate  Affect:  Flat  Cognitive:  Oriented  Insight:  Improving  Engagement in Group:  Limited  Engagement in Therapy:  Limited  Modes of Intervention:  Activity, Clarification, Education, Problem-solving and Support  Summary of Progress/Problems: Today's group addressed the issue of overcoming obstacles.  Patients were asked to identify their biggest obstacle post d/c that stands in the way of their on-going success, and then problem solve as to how to manage this.  Stayed the entire time, engaged throughout.  "My decision making can be an obstacle.  I lost a job about a month ago because I was not patient enough.  I said some things that I wish I could take back."  Tangential in his process-a bit difficult to follow.    Daryel Geraldorth, Therron Sells B 04/10/2016   1:11 PM

## 2016-04-11 LAB — LIPID PANEL
CHOLESTEROL: 185 mg/dL (ref 0–200)
HDL: 38 mg/dL — ABNORMAL LOW (ref >40)
LDL CALC: 125 mg/dL — AB (ref 0–99)
TRIGLYCERIDES: 111 mg/dL (ref <150)
Total CHOL/HDL Ratio: 4.9 RATIO
VLDL: 22 mg/dL (ref 0–40)

## 2016-04-11 LAB — TSH: TSH: 2.164 u[IU]/mL (ref 0.350–4.500)

## 2016-04-11 NOTE — Progress Notes (Signed)
Recreation Therapy Notes  Date: 04/11/16 Time: 1000 Location: 500 Hall Dayroom  Group Topic: Self-Esteem  Goal Area(s) Addresses:  Patient will identify positive ways to increase self-esteem. Patient will verbalize benefit of increased self-esteem.  Behavioral Response: Engaged  Intervention: Blank crest, colored pencils  Activity: Crest of Arms.  Patients were given a blank crest divided into four sections.  In each section, patients were to identify something positive about themselves.  Patients could choose from categories such as biggest accomplishment, proudest moment, best feature, etc or come up their own category.  Patients were to draw a picture to give their words a visual.  Education:  Self-Esteem, Discharge Planning.   Education Outcome: Acknowledges education/In group clarification offered/Needs additional education  Clinical Observations/Feedback: Pt expressed self-esteem is how you feel about yourself.  Pt also stated self-esteem is influenced by what you say to yourself.  Pt stated his biggest moment was graduating high school, he likes to sing, he wants to become a singer/songwriter and wants to travel to [place].  Pt stated focusing on his good qualities helps him understand that "because my circumstances are different doesn't mean I can't be successful".   Caroll RancherMarjette Liat Mayol, LRT/CTRS     Caroll RancherLindsay, Kenyetta Wimbish A 04/11/2016 12:41 PM

## 2016-04-11 NOTE — Progress Notes (Signed)
[facility] MD Progress Note  04/11/2016 12:16 PM Brandon Fuentes  MRN:  [id number] Subjective: Patient states " I am not comfortable here, [facility] is not treating me fair."  Objective:Patient seen and chart reviewed.Discussed patient with treatment team.  Pt today seen in his bed, is irritable , anxious , uncooperative. Pt reports he just wants to go home. Discussed that he is IVC ed , however pt states he is not. CSW to contact mother and discuss medication changes needed , will also educate patient. Pt however at this time is not cooperative , wants to stay on his current medications , does not want anyother changes .    Principal Problem: Bipolar disorder, curr episode mixed, severe, w/o psychotic features (HCC) Diagnosis:   Patient Active Problem List   Diagnosis Date Noted  . Bipolar disorder, curr episode mixed, severe, w/o psychotic features (HCC) [F31.63] 04/10/2016  . 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 Psychosis [F29] 04/01/2016   Total Time spent with patient: 20 minutes  Past Psychiatric History: Please see H&P.   Past Medical History:  Past Medical History:  Diagnosis Date  . Bipolar 1 disorder (HCC)    History reviewed. No pertinent surgical history. Family History:  Family History  Problem Relation Age of Onset  . Diabetes Mother    Family Psychiatric  History: Please see H&P.  Social History:  History  Alcohol Use No     History  Drug Use No    Social History   Social History  . Marital status: Single    Spouse name: N/A  . Number of children: N/A  . Years of education: N/A   Social History Main Topics  . Smoking status: Never Smoker  . Smokeless tobacco: Never Used  . Alcohol use No  . Drug use: No  . Sexual activity: Yes    Birth control/ protection: None   Other Topics Concern  . None   Social History Narrative  . None   Additional Social History:    Pain Medications: Denied abuse Prescriptions: Denied abuse Over the Counter: Denied  abuse History of alcohol / drug use?: No history of alcohol / drug abuse                    Sleep: Fair  Appetite:  Fair  Current Medications: Current Facility-Administered Medications  Medication Dose Route Frequency Provider Last Rate Last Dose  . acetaminophen (TYLENOL) tablet 650 mg  650 mg Oral Q6H PRN Charm Rings, NP      . alum & mag hydroxide-simeth (MAALOX/MYLANTA) 200-200-20 MG/5ML suspension 30 mL  30 mL Oral Q4H PRN Charm Rings, NP      . amantadine (SYMMETREL) capsule 100 mg  100 mg Oral BID Charm Rings, NP   100 mg at 04/11/16 0830  . hydrOXYzine (ATARAX/VISTARIL) tablet 25 mg  25 mg Oral Q6H PRN Charm Rings, NP      . lithium carbonate capsule 600 mg  600 mg Oral BID WC Charm Rings, NP   600 mg at 04/11/16 0830  . LORazepam (ATIVAN) tablet 2 mg  2 mg Oral BID PRN Jomarie Longs, MD       Or  . LORazepam (ATIVAN) injection 2 mg  2 mg Intramuscular BID PRN Jomarie Longs, MD      . LORazepam (ATIVAN) tablet 1 mg  1 mg Oral BID Charm Rings, NP   1 mg at 04/11/16 4098  . magnesium hydroxide (MILK OF MAGNESIA) suspension  30 mL  30 mL Oral Daily PRN Charm RingsJamison Y Lord, NP      . zolpidem (AMBIEN) tablet 5 mg  5 mg Oral QHS PRN Jomarie LongsSaramma Georgio Hattabaugh, MD        Lab Results:  Results for orders placed or performed during the hospital encounter of 04/09/16 (from the past 48 hour(s))  TSH     Status: None   Collection Time: 04/11/16  6:25 AM  Result Value Ref Range   TSH 2.164 0.350 - 4.500 uIU/mL    Comment: Performed by a 3rd Generation assay with a functional sensitivity of <=0.01 uIU/mL. Performed at [facility], [address] [place], [place] [address]   Lipid panel     Status: Abnormal   Collection Time: 04/11/16  6:25 AM  Result Value Ref Range   Cholesterol 185 0 - 200 mg/dL   Triglycerides 604111 <540<150 mg/dL   HDL 38 (L) >98>40 mg/dL   Total CHOL/HDL Ratio 4.9 RATIO   VLDL 22 0 - 40 mg/dL   LDL Cholesterol 119125 (H) 0 - 99 mg/dL     Comment:        Total Cholesterol/HDL:CHD Risk Coronary Heart Disease Risk Table                     Men   Women  1/2 Average Risk   3.4   3.3  Average Risk       5.0   4.4  2 X Average Risk   9.6   7.1  3 X Average Risk  23.4   11.0        Use the calculated Patient Ratio above and the CHD Risk Table to determine the patient's CHD Risk.        ATP III CLASSIFICATION (LDL):  <100     mg/dL   Optimal  147-829100-129  mg/dL   Near or Above                    Optimal  130-159  mg/dL   Borderline  562-130160-189  mg/dL   High  >865>190     mg/dL   Very High Performed at [facility], [address] [place], [place] [address]     Blood Alcohol level:  Lab Results  Component Value Date   [facility] 04/05/2016   ETH <5 03/30/2016    Metabolic Disorder Labs: Lab Results  Component Value Date   HGBA1C (H) 07/14/2009    6.1 (NOTE)                                                                       According to the ADA Clinical Practice Recommendations for 2011, when HbA1c is used as a screening test:   >=6.5%   Diagnostic of Diabetes Mellitus           (if abnormal result  is confirmed)  5.7-6.4%   Increased risk of developing Diabetes Mellitus  References:Diagnosis and Classification of Diabetes Mellitus,Diabetes Care,2011,34(Suppl 1):S62-S69 and Standards of Medical Care in         Diabetes - 2011,Diabetes Care,2011,34  (Suppl 1):S11-S61.   MPG 128 (H) 07/14/2009   No results found for: PROLACTIN Lab Results  Component Value Date   CHOL 185 04/11/2016   TRIG 111 04/11/2016   HDL 38 (L) 04/11/2016   CHOLHDL 4.9 04/11/2016   VLDL 22 04/11/2016   LDLCALC 125 (H) 04/11/2016   LDLCALC  07/14/2009    105        Total Cholesterol/HDL:CHD Risk Coronary Heart Disease Risk Table                     Men   Women  1/2 Average Risk   3.4   3.3  Average Risk       5.0   4.4  2 X Average Risk   9.6   7.1  3 X Average Risk  23.4   11.0        Use the calculated Patient Ratio above and the CHD  Risk Table to determine the patient's CHD Risk.        ATP III CLASSIFICATION (LDL):  <100     mg/dL   Optimal  161-096  mg/dL   Near or Above                    Optimal  130-159  mg/dL   Borderline  045-409  mg/dL   High  >811     mg/dL   Very High    Physical Findings: AIMS: Facial and Oral Movements Muscles of Facial Expression: None, normal Lips and Perioral Area: None, normal Jaw: None, normal Tongue: None, normal,Extremity Movements Upper (arms, wrists, hands, fingers): None, normal Lower (legs, knees, ankles, toes): None, normal, Trunk Movements Neck, shoulders, hips: None, normal, Overall Severity Severity of abnormal movements (highest score from questions above): None, normal Incapacitation due to abnormal movements: None, normal Patient's awareness of abnormal movements (rate only patient's report): No Awareness, Dental Status Current problems with teeth and/or dentures?: No Does patient usually wear dentures?: No  CIWA:    COWS:     Musculoskeletal: Strength & Muscle Tone: within normal limits Gait & Station: normal Patient leans: N/A  Psychiatric Specialty Exam: Physical Exam  Nursing note and vitals reviewed.   Review of Systems  Psychiatric/Behavioral: The patient is nervous/anxious.   All other systems reviewed and are negative.   Blood pressure 135/70, pulse 100, temperature 98 F (36.7 C), temperature source Oral, resp. rate 18, height 6\' 1"  (1.854 m), weight (!) 138.3 kg (305 lb), SpO2 100 %.Body mass index is 40.24 kg/m.  General Appearance: Guarded  Eye Contact:  Fair  Speech:  Pressured  Volume:  Increased  Mood:  Anxious and Irritable  Affect:  Labile  Thought Process:  Goal Directed and Descriptions of Associations: Tangential  Orientation:  Full (Time, Place, and Person)  Thought Content:  Rumination  Suicidal Thoughts:  No  Homicidal Thoughts:  No  Memory:  Immediate;   Fair Recent;   Fair Remote;   Fair  Judgement:  Impaired   Insight:  Shallow  Psychomotor Activity:  Restlessness  Concentration:  Concentration: Fair and Attention Span: Fair  Recall:  [employer] of Knowledge:  Fair  Language:  Fair  Akathisia:  No  Handed:  Right  AIMS (if indicated):     Assets:  Desire for Improvement  ADL's:  Intact  Cognition:  WNL  Sleep:  Number of Hours: 3   Bipolar disorder, curr episode mixed, severe, w/o psychotic features (HCC) unstable  Will continue today 04/11/16  plan as below except where it is noted.    Treatment Plan Summary:Patient today  seen as anxious, irritable , however uncooperative , reports he wants to stay on his current medications. CSW is working on obtaining collateral information from mother , as well get her assistance to educate patient about the need for medications changes. Pt with hx of NMS , risk is higher when antipsychotics combined with Lithium , will need to augment Li with another mood stabilizer given his current presentation. Will continue to discuss options with patient. Daily contact with patient to assess and evaluate symptoms and progress in treatment, Medication management and Plan see below Continue Li 600 mg po bid for mood sx. Li level reviewed as therapeutic. Will get another level in 4 days - 04/15/16. Continue Ativan 1 mg po bid , 2 mg po/IM prn for anxiety sx. Will increase Ambien to 10 mg po qhs for insomnia. EKG for qtc reviewed - shows sinuc tach , qtc - wnl. BMP repeat ordered for K+ - low. CSW will continue to work on disposition.  Kenyatte Chatmon, MD 04/11/2016, 12:16 PM

## 2016-04-11 NOTE — [facility] Group Notes (Signed)
[facility] Group Notes:  (Counselor/Nursing/MHT/Case Management/Adjunct)  04/11/2016 1:15PM  Type of Therapy:  Group Therapy  Participation Level:  Active  Participation Quality:  Appropriate  Affect:  Flat  Cognitive:  Oriented  Insight:  Improving  Engagement in Group:  Limited  Engagement in Therapy:  Limited  Modes of Intervention:  Discussion, Exploration and Socialization  Summary of Progress/Problems: The topic for group was balance in life.  Pt participated in the discussion about when their life was in balance and out of balance and how this feels.  Pt discussed ways to get back in balance and short term goals they can work on to get where they want to be.  "Emotional balance means that you are not manic.  Your reaction is appropriate to the situation."  Statres that he feels blanced today "because I am working on my coping skills, and I know the staff is here to help me too."  Talked about the struggle he has finding balance between focusing on others vs focusing on self.  "It can go bad either way if you don't find that sweet spot."   [name], [name] 04/11/2016 1:07 PM

## 2016-04-11 NOTE — [facility] Suicide Risk Assessment (Signed)
[facility] INPATIENT:  Family/Significant Other Suicide Prevention Education  Suicide Prevention Education:  Education Completed; No one has been identified by the patient as the family member/significant other with whom the patient will be residing, and identified as the person(s) who will aid the patient in the event of a mental health crisis (suicidal ideations/suicide attempt).  With written consent from the patient, the family member/significant other has been provided the following suicide prevention education, prior to the and/or following the discharge of the patient.  The suicide prevention education provided includes the following:  Suicide risk factors  Suicide prevention and interventions  National Suicide Hotline telephone number  [facility] assessment telephone number  [facility] Emergency Assistance 911  [facility] and/or Residential Mobile Crisis Unit telephone number  Request made of family/significant other to:  Remove weapons (e.g., guns, rifles, knives), all items previously/currently identified as safety concern.    Remove drugs/medications (over-the-counter, prescriptions, illicit drugs), all items previously/currently identified as a safety concern.  The family member/significant other verbalizes understanding of the suicide prevention education information provided.  The family member/significant other agrees to remove the items of safety concern listed above. The patient did not endorse SI at the time of admission, nor did the patient c/o SI during the stay here.  SPE not required. However, I did talk to mother, Jake SharkSandra Fuentes, [phone], and went over crises plan and treatment team recommendations.  She required multiple calls. Brandon DaubRodney B [facility] 04/11/2016, 3:48 PM

## 2016-04-11 NOTE — Tx Team (Signed)
Interdisciplinary Treatment and Diagnostic Plan Update  04/11/2016 Time of Session: 2:40 PM  Admir Candelas MRN: [id number]  Principal Diagnosis: Bipolar disorder, curr episode mixed, severe, w/o psychotic features ([place])  Secondary Diagnoses: Principal Problem:   Bipolar disorder, curr episode mixed, severe, w/o psychotic features ([place])   Current Medications:  Current Facility-Administered Medications  Medication Dose Route Frequency Provider Last Rate Last Dose  . acetaminophen (TYLENOL) tablet 650 mg  650 mg Oral Q6H PRN Patrecia Pour, NP      . alum & mag hydroxide-simeth (MAALOX/MYLANTA) 200-200-20 MG/5ML suspension 30 mL  30 mL Oral Q4H PRN Patrecia Pour, NP      . amantadine (SYMMETREL) capsule 100 mg  100 mg Oral BID Patrecia Pour, NP   100 mg at 04/11/16 0830  . hydrOXYzine (ATARAX/VISTARIL) tablet 25 mg  25 mg Oral Q6H PRN Patrecia Pour, NP      . lithium carbonate capsule 600 mg  600 mg Oral BID WC Patrecia Pour, NP   600 mg at 04/11/16 0830  . LORazepam (ATIVAN) tablet 2 mg  2 mg Oral BID PRN Ursula Alert, MD       Or  . LORazepam (ATIVAN) injection 2 mg  2 mg Intramuscular BID PRN Ursula Alert, MD      . LORazepam (ATIVAN) tablet 1 mg  1 mg Oral BID Patrecia Pour, NP   1 mg at 04/11/16 0600  . magnesium hydroxide (MILK OF MAGNESIA) suspension 30 mL  30 mL Oral Daily PRN Patrecia Pour, NP      . zolpidem (AMBIEN) tablet 5 mg  5 mg Oral QHS PRN Ursula Alert, MD        PTA Medications: Prescriptions Prior to Admission  Medication Sig Dispense Refill Last Dose  . hydrOXYzine (ATARAX/VISTARIL) 25 MG tablet Take 1 tablet (25 mg total) by mouth every 6 (six) hours as needed for anxiety. 30 tablet 0   . lithium carbonate 300 MG capsule Take 600-900 mg by mouth 2 (two) times daily.   04/04/2016 at Unknown time  . lithium carbonate 600 MG capsule Take 1 capsule (600 mg total) by mouth 2 (two) times daily. 60 capsule 0     Treatment Modalities: Medication Management,  Group therapy, Case management,  1 to 1 session with clinician, Psychoeducation, Recreational therapy.   Physician Treatment Plan for Primary Diagnosis: Bipolar disorder, curr episode mixed, severe, w/o psychotic features ([place]) Long Term Goal(s): Improvement in symptoms so as ready for discharge  Short Term Goals: Ability to verbalize feelings will improve   Medication Management: Evaluate patient's response, side effects, and tolerance of medication regimen.  Therapeutic Interventions: 1 to 1 sessions, Unit Group sessions and Medication administration.  Evaluation of Outcomes: Progressing  Physician Treatment Plan for Secondary Diagnosis: Principal Problem:   Bipolar disorder, curr episode mixed, severe, w/o psychotic features ([place])   Long Term Goal(s): Improvement in symptoms so as ready for discharge  Short Term Goals: Ability to verbalize feelings will improve Compliance with prescribed medications will improve Ability to verbalize feelings will improve Compliance with prescribed medications will improve  Medication Management: Evaluate patient's response, side effects, and tolerance of medication regimen.  Therapeutic Interventions: 1 to 1 sessions, Unit Group sessions and Medication administration.  Evaluation of Outcomes: Progressing   RN Treatment Plan for Primary Diagnosis: Bipolar disorder, curr episode mixed, severe, w/o psychotic features ([place]) Long Term Goal(s): Knowledge of disease and therapeutic regimen to maintain health will improve  Short Term  Goals: Ability to identify and develop effective coping behaviors will improve and Compliance with prescribed medications will improve  Medication Management: RN will administer medications as ordered by provider, will assess and evaluate patient's response and provide education to patient for prescribed medication. RN will report any adverse and/or side effects to prescribing provider.  Therapeutic Interventions: 1  on 1 counseling sessions, Psychoeducation, Medication administration, Evaluate responses to treatment, Monitor vital signs and CBGs as ordered, Perform/monitor CIWA, COWS, AIMS and Fall Risk screenings as ordered, Perform wound care treatments as ordered.  Evaluation of Outcomes: Progressing    Recreational Therapy Treatment Plan for Primary Diagnosis: Bipolar disorder, curr episode mixed, severe, w/o psychotic features ([place]) Long Term Goal(s): Patient will participate in recreation therapy treatment in at least 2 group sessions without prompting from LRT  Short Term Goals:  Without prompting or encouragement patient will spontaneously contribute to discussions during at least 2 recreation therapy group sessions by conclusion of recreation therapy tx.  Treatment Modalities: Group and Pet Therapy  Therapeutic Interventions: Psychoeducation  Evaluation of Outcomes: Progressing   LCSW Treatment Plan for Primary Diagnosis: Bipolar disorder, curr episode mixed, severe, w/o psychotic features ([place]) Long Term Goal(s): Safe transition to appropriate next level of care at discharge, Engage patient in therapeutic group addressing interpersonal concerns.  Short Term Goals: Engage patient in aftercare planning with referrals and resources  Therapeutic Interventions: Assess for all discharge needs, 1 to 1 time with Social worker, Explore available resources and support systems, Assess for adequacy in community support network, Educate family and significant other(s) on suicide prevention, Complete Psychosocial Assessment, Interpersonal group therapy.  Evaluation of Outcomes: Met  Return home with mother, follow up [facility]   Progress in Treatment: Attending groups: Yes Participating in groups: Yes Taking medication as prescribed: Yes Toleration medication: Yes, no side effects reported at this time Family/Significant other contact made: Yes Patient understands diagnosis: Yes AEB asking for help  with mood stabilization, anxiety Discussing patient identified problems/goals with staff: Yes Medical problems stabilized or resolved: Yes Denies suicidal/homicidal ideation: Yes Issues/concerns per patient self-inventory: None Other: N/A  New problem(s) identified: None identified at this time.   New Short Term/Long Term Goal(s): None identified at this time.   Discharge Plan or Barriers:   Reason for Continuation of Hospitalization: Anxiety Mood Instability Paranoia Medication stabilization  Estimated Length of Stay: 3-5 days  Attendees: Patient: 04/11/2016  2:40 PM  Physician: Ursula Alert, MD 04/11/2016  2:40 PM  Nursing: Otilio Carpen, RN 04/11/2016  2:40 PM  RN Care Manager: Lars Pinks, RN 04/11/2016  2:40 PM  Social Worker: Ripley Fraise 04/11/2016  2:40 PM  Recreational Therapist: Laretta Bolster  04/11/2016  2:40 PM  Other: Norberto Sorenson 04/11/2016  2:40 PM  Other:  04/11/2016  2:40 PM    Scribe for Treatment Team:  Roque Lias LCSW 04/11/2016 2:40 PM

## 2016-04-11 NOTE — Progress Notes (Signed)
D: Pt at the time of assessment denied depression, anxiety, pain, SI, HI or AVH; states, "I am doing fine" Pt was however was flat, isolative and withdrawn self even while in the dayroom. Pt also observed to be confused and paranoid; could be reacting to some internal stimuli. Pt remained nonviolent.  A: Medications offered as prescribed. All patient's questions and concerns addressed. Support, encouragement, and safe environment provided. 15-minute safety checks continue.   R: Pt attended wrap-up group. Safety checks continue.

## 2016-04-11 NOTE — Progress Notes (Addendum)
D: Pt A & O to self, place and time. Denies SI, HI, AVH and pain when assessed. Pt presents irritable on initial approach this AM "I don't feel like I'm treated fairly here, people may think because I'm this size I'm intimidating, I see what goes on around me here, I'm not safe". Pt's remains paranoid and religiously preoccupied, "I just don't trust people about my health, they almost killed me one time at another hospital" Speech is tangential and disorganized. Pt unable to state circumstances related to his perception of being treated unfairly when prompted by staff, instead became more frustrated. Reports fair sleep, fair appetite, normal energy and good concentration level on self inventory sheet. Rates his depression 0/10, hopelessness 0/10 and anxiety 0/10. A: Medications administered as prescribed and effects monitored. Encouraged pt to voice concerns, attend groups and comply with his treatment plan. Emotional support and availability offered to pt. Safety checks maintained at Q 15 minutes intervals.  R: Pt was apologetic to staff this evening for his behavior "I'm sorry for this morning, I get like that because I can't trust people about health". Tolerates all PO intake well. Pt attended scheduled groups on unit. Compliant with medications when offered. Denies adverse drug reactions at this time. Remains safe on and off unit. POC continues.

## 2016-04-11 NOTE — Progress Notes (Signed)
Adult Psychoeducational Group Note  Date:  04/11/2016 Time:  8:36 PM  Group Topic/Focus:  Wrap-Up Group:   The focus of this group is to help patients review their daily goal of treatment and discuss progress on daily workbooks.  Participation Level:  Active  Participation Quality:  Appropriate  Affect:  Appropriate  Cognitive:  Alert  Insight: Appropriate  Engagement in Group:  Engaged  Modes of Intervention:  Discussion  Additional Comments:  Pt stated that today started out as challenge, but he was able to calm down. His goal is to work on finding some healthy coping skills and start preparing for discharge.   Flonnie HailstoneCOOKE, Koya Hunger R 04/11/2016, 8:36 PM

## 2016-04-11 NOTE — Plan of Care (Signed)
Problem: Safety: Goal: Periods of time without injury will increase Outcome: Progressing Pt. remains a low fall risk, denies SI/HI/AVH at this time, Q 15 checks in place.    

## 2016-04-11 NOTE — Progress Notes (Signed)
  DATA ACTION RESPONSE  Objective- Pt. is up and visible in the hallway seen interacting with staff. Pt. presents with an anxious/animated affect and mood. Pt. is hyperverbal;  thought process is tangential and rapid with religious connotations. Appears to be minimizing s/s. Is paranoid with medications.Subjective- Denies having any SI/HI/AVH/Pain at this time. Pt. states " I don't like taking psychiatric medications because of what happen to me". Pt. continues to be cooperative and remain safe & pleasant on the unit.  1:1 interaction in private to establish rapport. Encouragement, education, & support given from staff. No meds. ordered at this time.   Safety maintained with Q 15 checks. Continues to follow treatment plan and will monitor closely. No additonal questions/concerns noted.

## 2016-04-12 LAB — BASIC METABOLIC PANEL
ANION GAP: 9 (ref 5–15)
BUN: 9 mg/dL (ref 6–20)
CO2: 26 mmol/L (ref 22–32)
Calcium: 9.6 mg/dL (ref 8.9–10.3)
Chloride: 104 mmol/L (ref 101–111)
Creatinine, Ser: 1.32 mg/dL — ABNORMAL HIGH (ref 0.61–1.24)
Glucose, Bld: 108 mg/dL — ABNORMAL HIGH (ref 65–99)
POTASSIUM: 3.7 mmol/L (ref 3.5–5.1)
SODIUM: 139 mmol/L (ref 135–145)

## 2016-04-12 LAB — HEMOGLOBIN A1C
Hgb A1c MFr Bld: 5.8 % — ABNORMAL HIGH (ref 4.8–5.6)
Mean Plasma Glucose: 120 mg/dL

## 2016-04-12 LAB — PROLACTIN: PROLACTIN: 16.4 ng/mL — AB (ref 4.0–15.2)

## 2016-04-12 MED ORDER — OXCARBAZEPINE 150 MG PO TABS
150.0000 mg | ORAL_TABLET | Freq: Two times a day (BID) | ORAL | Status: DC
  Administered 2016-04-12 – 2016-04-16 (×8): 150 mg via ORAL
  Filled 2016-04-12 (×12): qty 1

## 2016-04-12 NOTE — Progress Notes (Signed)
DATA ACTION RESPONSE  Objective- Pt. is up and visible in the hallway seen pacing. Pt. presents with an anxious/animated affect and mood. Pt. is hyperverbal and argumentative; thought process is tangential. Appears to be minimizing s/s. Remains paranoid with medications. Subjective- Denies having any SI/HI/AVH/Pain at this time. Pt. states " It's all about balance between peace and sleep". Needs redirection but is receptive. Is safe on the unit.  1:1 interaction in private to establish rapport. Encouragement, education, & support given from staff. No meds. ordered at this time. No PRNs needed.   Safety maintained with Q 15 checks. Continues to follow treatment plan and will monitor closely. No additonal questions/concerns noted.

## 2016-04-12 NOTE — Progress Notes (Signed)
Recreation Therapy Notes  Date: 04/12/16 Time: 0950 Location: 500 [address] Dayroom  Group Topic: Anger Management  Goal Area(s) Addresses:  Patient will identify triggers for anger.  Patient will identify physical reaction to anger.   Patient will identify benefit of using coping skills when angry.  Behavioral Response: Engaged  Intervention: Anger thermometer worksheet, pencils  Activity: Anger Thermometer.  Patients were to think of 5 instances were they became anger and rank them (1-10) on the thermometer.  Patients were to give Fuentes brief description of the incidences, their reaction to it, how they felt and the consequences.  Patients were to also identify at least 5 coping skills they could use to combat anger.     Education: Anger Management, Discharge Planning   Education Outcome: Acknowledges education/In group clarification offered/Needs additional education.   Clinical Observations/Feedback: Pt stated people express anger verbally and physically.  Pt identified some symptoms of anger as increase in heart rate and Fuentes rise in blood pressure.  Pt identified two of the situations that get him angry are  when he is disrespected or discouraged.  Pt stated some of his coping skills were "try to fix it, regulation action, take Fuentes stand for myself and ignore it".  Pt stated focusing on positive coping skills "helps you handle things correctly, preserve yourself and respect the other person at the same time".   Brandon Fuentes, Brandon Fuentes      Brandon RancherLindsay, Brandon Fuentes 04/12/2016 11:33 AM

## 2016-04-12 NOTE — [facility] Group Notes (Signed)
Type of Therapy:  Group Therapy   Participation Level:  Engaged  Participation Quality:  Attentive  Affect:  Appropriate   Cognitive:  Alert   Insight:  Engaged  Engagement in Therapy:  Improving   Mode s of Intervention:  Education, Exploration, Socialization   Summary of Progress/Problems: Engaged throughout, stayed entire time.   Brandon Fuentes from [facility] was here to tell her story of recovery and inform patients about [facility] and their services.     

## 2016-04-12 NOTE — [facility] Counselor (Signed)
Adult Comprehensive Assessment  Patient ID: Brandon Fuentes, male   DOB: [birth year], 26 y.o.   MRN: [id number]  Information Source: Information source: Patient  Current Stressors:  Employment / Job issues: Veterinary surgeonUnemployed Financial / Lack of resources (include bankruptcy): No income, dpendent upon mother Housing / Lack of housing: Stays with mother  Living/Environment/Situation:  Living Arrangements: Parent Living conditions (as described by patient or guardian): good How long has patient lived in current situation?: "Pretty much all my life, except for 1 semester at college, and a short time with my dad." What is atmosphere in current home: Comfortable, Supportive  Family History:  Are you sexually active?: No What is your sexual orientation?: straight Does patient have children?: No  Childhood History:  By whom was/is the patient raised?: Mother/father and step-parent Additional childhood history information: Mother and step father, with some visitation with father, mother and step father split up when mother moved to [place] last October Description of patient's relationship with caregiver when they were a child: good with mom, OK with dad-I think their break up affected both my parents Patient's description of current relationship with people who raised him/her: rocky because I had trauma from my sickness, and its important for us all to respect each other's spacde Does patient have siblings?: Yes Number of Siblings: 5 Description of patient's current relationship with siblings: 1 full brother, 4 half's-not close with any of them Did patient suffer any verbal/emotional/physical/sexual abuse as a child?: No Did patient suffer from severe childhood neglect?: No Has patient ever been sexually abused/assaulted/raped as an adolescent or adult?: No Was the patient ever a victim of a crime or a disaster?: No Witnessed domestic violence?: No Has patient been effected by domestic violence as an  adult?: No  Education:  Highest grade of school patient has completed: 12 plus short time at [employer] Currently a Consulting civil engineerstudent?: No Learning disability?: No  Employment/Work Situation:   Employment situation: Unemployed Patient's job has been impacted by current illness: Yes Describe how patient's job has been impacted: "I wasn't taking all my meds What is the longest time patient has a held a job?: 4 months Where was the patient employed at that time?: [employer] Has patient ever been in the Eli Lilly and Companymilitary?: No Are There Guns or Other Weapons in Your Home?: No  Financial Resources:   Financial resources: No income Does patient have a Lawyerrepresentative payee or guardian?: No  Alcohol/Substance Abuse:   Alcohol/Substance Abuse Treatment Hx: Denies past history Has alcohol/substance abuse ever caused legal problems?: No  Social Support System:   Conservation officer, natureatient's Community Support System: Good Describe Community Support System: Mom, Dad, siblings,grandmother and other relatives  Type of faith/religion: Ephriam KnucklesChristian How does patient's faith help to cope with current illness?: "By reading the word, I am told God will always make it better for me."  Leisure/Recreation:   Leisure and Hobbies: singing, working, volunteering  Strengths/Needs:   What things does the patient do well?: communicating In what areas does patient struggle / problems for patient: communicating effectively, consistency in every sense of the word  Discharge Plan:   Does patient have access to transportation?: Yes Will patient be returning to same living situation after discharge?: Yes Currently receiving community mental health services: Yes (From Whom) Does patient have financial barriers related to discharge medications?: No  Summary/Recommendations:   Summary and Recommendations (to be completed by the evaluator): Brandon Fuentes is a 26 YO AA mlae diagnosed with Bipolar D/O w/o psychosis.  He was first diagnosed as a  teenager, and,  according to both he and his mother, has been medication compliant, though he currently does not have a Dr as the family moved here from out of town about 6 months ago.  Brandon Fuentes presented with catatonia iniitally.  He is reluctant to try new medications as he has had side affects to trials in the past.  Brandon Fuentes will return home at d/c and follow up at [facility].  In the meantime, he can benefit from crises stabilization, medication management, therapeutic milieu and referral for services.    Brandon Fuentes. 04/12/2016

## 2016-04-12 NOTE — Plan of Care (Signed)
Problem: Activity: Goal: Sleeping patterns will improve Outcome: Progressing Pt. slept 2 hrs last night. Bedtime PRN meds offered and Pt. refused.

## 2016-04-12 NOTE — Progress Notes (Signed)
Patient denies SI, Hi and AVH this shift.   Patient remains paranoid and has been unable to leave the unit due to paranoia.  Patient has had no issues of behavioral dyscontrol.   Assess patient for safety, offer medications as prescribed, engage patient in 1:1 staff talks.   Offer medications as prescribed.

## 2016-04-12 NOTE — Progress Notes (Signed)
[facility] MD Progress Note  04/12/2016 2:48 PM Brandon Fuentes  MRN:  [id number] Subjective: Patient states " I am able to think clearer now , I feel like I was making some wrong statements.'  Objective:Patient seen and chart reviewed.Discussed patient with treatment team.  Pt today seen as anxious , but is less irritable than yesterday. Pt able to sit down and discuss medication options, agrees to trileptal. Pt provided medication education, discussed ADRs. Continue to support to attend groups and participate. Per staff, often has mood lability and irritability, requires redirection.    Principal Problem: Bipolar disorder, curr episode mixed, severe, w/o psychotic features ([place]) Diagnosis:   Patient Active Problem List   Diagnosis Date Noted  . Bipolar disorder, curr episode mixed, severe, w/o psychotic features ([place]) [F31.63] 04/10/2016  . 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 Psychosis [F29] 04/01/2016   Total Time spent with patient: 25 minutes  Past Psychiatric History: Please see H&P.   Past Medical History:  Past Medical History:  Diagnosis Date  . Bipolar 1 disorder ([place])    History reviewed. No pertinent surgical history. Family History:  Family History  Problem Relation Age of Onset  . Diabetes Mother    Family Psychiatric  History: Please see H&P.  Social History:  History  Alcohol Use No     History  Drug Use No    Social History   Social History  . Marital status: Single    Spouse name: N/A  . Number of children: N/A  . Years of education: N/A   Social History Main Topics  . Smoking status: Never Smoker  . Smokeless tobacco: Never Used  . Alcohol use No  . Drug use: No  . Sexual activity: Yes    Birth control/ protection: None   Other Topics Concern  . None   Social History Narrative  . None   Additional Social History:    Pain Medications: Denied abuse Prescriptions: Denied abuse Over the Counter: Denied abuse History of alcohol / drug  use?: No history of alcohol / drug abuse                    Sleep: Fair  Appetite:  Fair  Current Medications: Current Facility-Administered Medications  Medication Dose Route Frequency Provider Last Rate Last Dose  . acetaminophen (TYLENOL) tablet 650 mg  650 mg Oral Q6H PRN Patrecia Pour, NP      . alum & mag hydroxide-simeth (MAALOX/MYLANTA) 200-200-20 MG/5ML suspension 30 mL  30 mL Oral Q4H PRN Patrecia Pour, NP      . amantadine (SYMMETREL) capsule 100 mg  100 mg Oral BID Patrecia Pour, NP   100 mg at 04/12/16 1194  . hydrOXYzine (ATARAX/VISTARIL) tablet 25 mg  25 mg Oral Q6H PRN Patrecia Pour, NP      . lithium carbonate capsule 600 mg  600 mg Oral BID WC Patrecia Pour, NP   600 mg at 04/12/16 0829  . LORazepam (ATIVAN) tablet 2 mg  2 mg Oral BID PRN Ursula Alert, MD       Or  . LORazepam (ATIVAN) injection 2 mg  2 mg Intramuscular BID PRN Ursula Alert, MD      . LORazepam (ATIVAN) tablet 1 mg  1 mg Oral BID Patrecia Pour, NP   1 mg at 04/12/16 0829  . magnesium hydroxide (MILK OF MAGNESIA) suspension 30 mL  30 mL Oral Daily PRN Patrecia Pour, NP      .  OXcarbazepine (TRILEPTAL) tablet 150 mg  150 mg Oral BID Ursula Alert, MD      . zolpidem (AMBIEN) tablet 5 mg  5 mg Oral QHS PRN Ursula Alert, MD        Lab Results:  Results for orders placed or performed during the hospital encounter of 04/09/16 (from the past 48 hour(s))  TSH     Status: None   Collection Time: 04/11/16  6:25 AM  Result Value Ref Range   TSH 2.164 0.350 - 4.500 uIU/mL    Comment: Performed by a 3rd Generation assay with a functional sensitivity of <=0.01 uIU/mL. Performed at [facility], [place] [address], [place], [place] [address]   Lipid panel     Status: Abnormal   Collection Time: 04/11/16  6:25 AM  Result Value Ref Range   Cholesterol 185 0 - 200 mg/dL   Triglycerides 111 <150 mg/dL   HDL 38 (L) >40 mg/dL   Total CHOL/HDL Ratio 4.9 RATIO   VLDL 22 0 - 40  mg/dL   LDL Cholesterol 125 (H) 0 - 99 mg/dL    Comment:        Total Cholesterol/HDL:CHD Risk Coronary Heart Disease Risk Table                     Men   Women  1/2 Average Risk   3.4   3.3  Average Risk       5.0   4.4  2 X Average Risk   9.6   7.1  3 X Average Risk  23.4   11.0        Use the calculated Patient Ratio above and the CHD Risk Table to determine the patient's CHD Risk.        ATP III CLASSIFICATION (LDL):  <100     mg/dL   Optimal  100-129  mg/dL   Near or Above                    Optimal  130-159  mg/dL   Borderline  160-189  mg/dL   High  >190     mg/dL   Very High Performed at [facility] [address] [place], [place] [address]   Hemoglobin A1c     Status: Abnormal   Collection Time: 04/11/16  6:25 AM  Result Value Ref Range   Hgb A1c MFr Bld 5.8 (H) 4.8 - 5.6 %    Comment: (NOTE)         Pre-diabetes: 5.7 - 6.4         Diabetes: >6.4         Glycemic control for adults with diabetes: <7.0    Mean Plasma Glucose 120 mg/dL    Comment: (NOTE) Performed At: [facility] [place], [place] [id number] Lindon Romp MD WY:[id number] Performed at [facility], [place] [address], [place], [place] [address]   Prolactin     Status: Abnormal   Collection Time: 04/11/16  6:25 AM  Result Value Ref Range   Prolactin 16.4 (H) 4.0 - 15.2 ng/mL    Comment: (NOTE) Performed At: [facility] [place], [place] [id number] Lindon Romp MD NO:[id number] Performed at [facility], [place] [address], [place], [place] [address]   Basic metabolic panel     Status: Abnormal   Collection Time: 04/12/16  6:15 AM  Result Value Ref Range   Sodium 139  135 - 145 mmol/L   Potassium 3.7 3.5 - 5.1 mmol/L   Chloride 104 101 - 111 mmol/L   CO2 26 22 - 32 mmol/L   Glucose, Bld 108 (H) 65 - 99 mg/dL   BUN 9 6 - 20 mg/dL   Creatinine, Ser 1.32 (H) 0.61 - 1.24 mg/dL   Calcium 9.6 8.9 -  10.3 mg/dL   GFR calc non Af Amer >60 >60 mL/min   GFR calc Af Amer >60 >60 mL/min    Comment: (NOTE) The eGFR has been calculated using the CKD EPI equation. This calculation has not been validated in all clinical situations. eGFR's persistently <60 mL/min signify possible Chronic Kidney Disease.    Anion gap 9 5 - 15    Comment: Performed at [facility], [place] [address] [place], [place] [address]    Blood Alcohol level:  Lab Results  Component Value Date   [facility] <5 04/05/2016   ETH <5 70/96/2836    Metabolic Disorder Labs: Lab Results  Component Value Date   HGBA1C 5.8 (H) 04/11/2016   MPG 120 04/11/2016   MPG 128 (H) 07/14/2009   Lab Results  Component Value Date   PROLACTIN 16.4 (H) 04/11/2016   Lab Results  Component Value Date   CHOL 185 04/11/2016   TRIG 111 04/11/2016   HDL 38 (L) 04/11/2016   CHOLHDL 4.9 04/11/2016   VLDL 22 04/11/2016   LDLCALC 125 (H) 04/11/2016   LDLCALC  07/14/2009    105        Total Cholesterol/HDL:CHD Risk Coronary Heart Disease Risk Table                     Men   Women  1/2 Average Risk   3.4   3.3  Average Risk       5.0   4.4  2 X Average Risk   9.6   7.1  3 X Average Risk  23.4   11.0        Use the calculated Patient Ratio above and the CHD Risk Table to determine the patient's CHD Risk.        ATP III CLASSIFICATION (LDL):  <100     mg/dL   Optimal  100-129  mg/dL   Near or Above                    Optimal  130-159  mg/dL   Borderline  160-189  mg/dL   High  >190     mg/dL   Very High    Physical Findings: AIMS: Facial and Oral Movements Muscles of Facial Expression: None, normal Lips and Perioral Area: None, normal Jaw: None, normal Tongue: None, normal,Extremity Movements Upper (arms, wrists, hands, fingers): None, normal Lower (legs, knees, ankles, toes): None, normal, Trunk Movements Neck, shoulders, hips: None, normal, Overall Severity Severity of abnormal movements (highest score  from questions above): None, normal Incapacitation due to abnormal movements: None, normal Patient's awareness of abnormal movements (rate only patient's report): No Awareness, Dental Status Current problems with teeth and/or dentures?: No Does patient usually wear dentures?: No  CIWA:    COWS:     Musculoskeletal: Strength & Muscle Tone: within normal limits Gait & Station: normal Patient leans: N/A  Psychiatric Specialty Exam: Physical Exam  Nursing note and vitals reviewed.   Review of Systems  Psychiatric/Behavioral: The patient is nervous/anxious.   All other systems reviewed and are negative.   Blood pressure 118/72,  pulse 88, temperature 98.4 F (36.9 C), temperature source Oral, resp. rate 16, height _0  (1.854 m), weight (!) 138.3 kg (305 lb), SpO2 100 %.Body mass index is 40.24 kg/m.  General Appearance: Guarded  Eye Contact:  Fair  Speech:  Pressured  Volume:  Normal  Mood:  Anxious and Irritable  Affect:  Labile  Thought Process:  Goal Directed and Descriptions of Associations: Circumstantial  Orientation:  Full (Time, Place, and Person)  Thought Content:  Rumination  Suicidal Thoughts:  No  Homicidal Thoughts:  No  Memory:  Immediate;   Fair Recent;   Fair Remote;   Fair  Judgement:  Impaired  Insight:  Shallow  Psychomotor Activity:  Restlessness  Concentration:  Concentration: Fair and Attention Span: Fair  Recall:  [employer] of Knowledge:  Fair  Language:  Fair  Akathisia:  No  Handed:  Right  AIMS (if indicated):     Assets:  Desire for Improvement  ADL's:  Intact  Cognition:  WNL  Sleep:  Number of Hours: 2   Bipolar disorder, curr episode mixed, severe, w/o psychotic features ([place]) unstable  Will continue today 04/12/16  plan as below except where it is noted.      Treatment Plan Summary:Patient today seen as anxious , labile and requires redirection.  Pt with hx of NMS , risk is higher when antipsychotics combined with Nicoletta Dress. Daily  contact with patient to assess and evaluate symptoms and progress in treatment, Medication management and Plan see below Continue Li 600 mg po bid for mood sx. Li level reviewed as therapeutic. Will get another level in 4 days - 04/15/16. Will start Trilpetal 150 mg po bid for mood lability, augment Li. Continue Ativan 1 mg po bid , 2 mg po/IM prn for anxiety sx. Will continue Ambien 10 mg po qhs for insomnia. EKG for qtc reviewed - shows sinuc tach , qtc - wnl. BMP repeat shows K+ as wnl , hba1c- 5.8. CSW will continue to work on disposition.  Gesselle Fitzsimons, MD 04/12/2016, 2:48 PM

## 2016-04-12 NOTE — Progress Notes (Signed)
Adult Psychoeducational Group Note  Date:  04/12/2016 Time:  9:14 PM  Group Topic/Focus:  Wrap-Up Group:   The focus of this group is to help patients review their daily goal of treatment and discuss progress on daily workbooks.  Participation Level:  Active  Participation Quality:  Appropriate  Affect:  Appropriate  Cognitive:  Alert  Insight: Appropriate  Engagement in Group:  Engaged  Modes of Intervention:  Discussion  Additional Comments:  Pt rated his day 9/10. His goal is to continue to treat people with kindness.   Kaleen OdeaCOOKE, Nandita Mathenia R 04/12/2016, 9:14 PM

## 2016-04-13 MED ORDER — CLONAZEPAM 0.5 MG PO TABS
0.5000 mg | ORAL_TABLET | Freq: Two times a day (BID) | ORAL | Status: DC
  Administered 2016-04-13 – 2016-04-16 (×6): 0.5 mg via ORAL
  Filled 2016-04-13 (×7): qty 1

## 2016-04-13 MED ORDER — CLONAZEPAM 0.5 MG PO TABS
0.2500 mg | ORAL_TABLET | Freq: Every day | ORAL | Status: DC
  Administered 2016-04-13 – 2016-04-15 (×3): 0.25 mg via ORAL
  Filled 2016-04-13 (×3): qty 1

## 2016-04-13 MED ORDER — MIRTAZAPINE 7.5 MG PO TABS
7.5000 mg | ORAL_TABLET | Freq: Every day | ORAL | Status: DC
  Administered 2016-04-14 – 2016-04-15 (×2): 7.5 mg via ORAL
  Filled 2016-04-13 (×5): qty 1

## 2016-04-13 NOTE — Progress Notes (Signed)
[facility] MD Progress Note  04/13/2016 2:33 PM Brandon Fuentes  MRN:  [id number] Subjective: Patient states " I did not sleep all that well last night. I do not think my sleep medication is effective."   Objective:Patient seen and chart reviewed.Discussed patient with treatment team.  Pt today seen as less anxious , tolerating medications well. Pt reports sleep as restless, will change his sleep medications tonight. Also discussed changing ativan to klonopin, since he did well on it in the past, pt agrees. Will continue to treat.     Principal Problem: Bipolar disorder, curr episode mixed, severe, w/o psychotic features ([place]) Diagnosis:   Patient Active Problem List   Diagnosis Date Noted  . Bipolar disorder, curr episode mixed, severe, w/o psychotic features ([place]) [F31.63] 04/10/2016  . 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 Psychosis [F29] 04/01/2016   Total Time spent with patient: 25 minutes  Past Psychiatric History: Please see H&P.   Past Medical History:  Past Medical History:  Diagnosis Date  . Bipolar 1 disorder ([place])    History reviewed. No pertinent surgical history. Family History:  Family History  Problem Relation Age of Onset  . Diabetes Mother    Family Psychiatric  History: Please see H&P.  Social History:  History  Alcohol Use No     History  Drug Use No    Social History   Social History  . Marital status: Single    Spouse name: N/A  . Number of children: N/A  . Years of education: N/A   Social History Main Topics  . Smoking status: Never Smoker  . Smokeless tobacco: Never Used  . Alcohol use No  . Drug use: No  . Sexual activity: Yes    Birth control/ protection: None   Other Topics Concern  . None   Social History Narrative  . None   Additional Social History:    Pain Medications: Denied abuse Prescriptions: Denied abuse Over the Counter: Denied abuse History of alcohol / drug use?: No history of alcohol / drug abuse                     Sleep: Fair  Appetite:  Fair  Current Medications: Current Facility-Administered Medications  Medication Dose Route Frequency Provider Last Rate Last Dose  . acetaminophen (TYLENOL) tablet 650 mg  650 mg Oral Q6H PRN Patrecia Pour, NP      . alum & mag hydroxide-simeth (MAALOX/MYLANTA) 200-200-20 MG/5ML suspension 30 mL  30 mL Oral Q4H PRN Patrecia Pour, NP      . amantadine (SYMMETREL) capsule 100 mg  100 mg Oral BID Patrecia Pour, NP   100 mg at 04/13/16 0725  . clonazePAM (KLONOPIN) tablet 0.5 mg  0.5 mg Oral BID Ursula Alert, MD      . hydrOXYzine (ATARAX/VISTARIL) tablet 25 mg  25 mg Oral Q6H PRN Patrecia Pour, NP      . lithium carbonate capsule 600 mg  600 mg Oral BID WC Patrecia Pour, NP   600 mg at 04/13/16 0725  . LORazepam (ATIVAN) tablet 2 mg  2 mg Oral BID PRN Ursula Alert, MD       Or  . LORazepam (ATIVAN) injection 2 mg  2 mg Intramuscular BID PRN Ursula Alert, MD      . magnesium hydroxide (MILK OF MAGNESIA) suspension 30 mL  30 mL Oral Daily PRN Patrecia Pour, NP      . mirtazapine (REMERON) tablet 7.5 mg  7.5 mg Oral QHS Ursula Alert, MD      . OXcarbazepine (TRILEPTAL) tablet 150 mg  150 mg Oral BID Ursula Alert, MD   150 mg at 04/13/16 0725    Lab Results:  Results for orders placed or performed during the hospital encounter of 04/09/16 (from the past 48 hour(s))  Basic metabolic panel     Status: Abnormal   Collection Time: 04/12/16  6:15 AM  Result Value Ref Range   Sodium 139 135 - 145 mmol/L   Potassium 3.7 3.5 - 5.1 mmol/L   Chloride 104 101 - 111 mmol/L   CO2 26 22 - 32 mmol/L   Glucose, Bld 108 (H) 65 - 99 mg/dL   BUN 9 6 - 20 mg/dL   Creatinine, Ser 1.32 (H) 0.61 - 1.24 mg/dL   Calcium 9.6 8.9 - 10.3 mg/dL   GFR calc non Af Amer >60 >60 mL/min   GFR calc Af Amer >60 >60 mL/min    Comment: (NOTE) The eGFR has been calculated using the CKD EPI equation. This calculation has not been validated in all clinical  situations. eGFR's persistently <60 mL/min signify possible Chronic Kidney Disease.    Anion gap 9 5 - 15    Comment: Performed at [facility], [place] [address] [place], [place] [address]    Blood Alcohol level:  Lab Results  Component Value Date   [facility] <5 04/05/2016   ETH <5 02/77/4128    Metabolic Disorder Labs: Lab Results  Component Value Date   HGBA1C 5.8 (H) 04/11/2016   MPG 120 04/11/2016   MPG 128 (H) 07/14/2009   Lab Results  Component Value Date   PROLACTIN 16.4 (H) 04/11/2016   Lab Results  Component Value Date   CHOL 185 04/11/2016   TRIG 111 04/11/2016   HDL 38 (L) 04/11/2016   CHOLHDL 4.9 04/11/2016   VLDL 22 04/11/2016   LDLCALC 125 (H) 04/11/2016   LDLCALC  07/14/2009    105        Total Cholesterol/HDL:CHD Risk Coronary Heart Disease Risk Table                     Men   Women  1/2 Average Risk   3.4   3.3  Average Risk       5.0   4.4  2 X Average Risk   9.6   7.1  3 X Average Risk  23.4   11.0        Use the calculated Patient Ratio above and the CHD Risk Table to determine the patient's CHD Risk.        ATP III CLASSIFICATION (LDL):  <100     mg/dL   Optimal  100-129  mg/dL   Near or Above                    Optimal  130-159  mg/dL   Borderline  160-189  mg/dL   High  >190     mg/dL   Very High    Physical Findings: AIMS: Facial and Oral Movements Muscles of Facial Expression: None, normal Lips and Perioral Area: None, normal Jaw: None, normal Tongue: None, normal,Extremity Movements Upper (arms, wrists, hands, fingers): None, normal Lower (legs, knees, ankles, toes): None, normal, Trunk Movements Neck, shoulders, hips: None, normal, Overall Severity Severity of abnormal movements (highest score from questions above): None, normal Incapacitation due to abnormal movements: None, normal Patient's awareness of abnormal movements (rate  only patient's report): No Awareness, Dental Status Current problems with teeth  and/or dentures?: No Does patient usually wear dentures?: No  CIWA:    COWS:     Musculoskeletal: Strength & Muscle Tone: within normal limits Gait & Station: normal Patient leans: N/A  Psychiatric Specialty Exam: Physical Exam  Nursing note and vitals reviewed.   Review of Systems  Psychiatric/Behavioral: The patient is nervous/anxious and has insomnia.   All other systems reviewed and are negative.   Blood pressure 115/72, pulse 66, temperature 98.5 F (36.9 C), temperature source Oral, resp. rate 18, height '6\' 1"'  (1.854 m), weight (!) 138.3 kg (305 lb), SpO2 100 %.Body mass index is 40.24 kg/m.  General Appearance: Fairly Groomed  Eye Contact:  Fair  Speech:  Normal Rate  Volume:  Normal  Mood:  Anxious  Affect:  Labile  Thought Process:  Goal Directed and Descriptions of Associations: Circumstantial  Orientation:  Full (Time, Place, and Person)  Thought Content:  Rumination  Suicidal Thoughts:  No  Homicidal Thoughts:  No  Memory:  Immediate;   Fair Recent;   Fair Remote;   Fair  Judgement:  Fair  Insight:  Present  Psychomotor Activity:  Restlessness  Concentration:  Concentration: Poor and Attention Span: Poor  Recall:  [employer] of Knowledge:  Fair  Language:  Fair  Akathisia:  No  Handed:  Right  AIMS (if indicated):     Assets:  Desire for Improvement  ADL's:  Intact  Cognition:  WNL  Sleep:  Number of Hours: 4.5   Bipolar disorder, curr episode mixed, severe, w/o psychotic features ([place]) unstable  Will continue today 04/13/16  plan as below except where it is noted.        Treatment Plan Summary:Pt today seem as less anxious , reports sleep continues to be an issue . Discussed medication changes , will continue treatment. Pt with hx of NMS , risk is higher when antipsychotics combined with Nicoletta Dress. Daily contact with patient to assess and evaluate symptoms and progress in treatment, Medication management and Plan see below Continue Li 600 mg po  bid for mood sx. Li level reviewed as therapeutic. Will get another level in 4 days - 04/15/16. Trilpetal 150 mg po bid for mood lability, augment Li. Will change Ativan to Klonopin 0.5 mg po bid and 0.25 mg po qhs for anxiety sx. Will change Ambien to Remeron 7.5 mg po qhs for sleep problems. EKG for qtc reviewed - shows sinuc tach , qtc - wnl. BMP repeat shows K+ as wnl , hba1c- 5.8. CSW will continue to work on disposition.  Brandon Senger, MD 04/13/2016, 2:33 PM

## 2016-04-13 NOTE — Plan of Care (Signed)
Problem: Activity: Goal: Interest or engagement in activities will improve Outcome: Progressing Pt. attends group this evening and is seen talking on the phone to mother.

## 2016-04-13 NOTE — Progress Notes (Signed)
DATA ACTION RESPONSE  Objective-Brandon Fuentes. is up and visible in the dayroom seen talking on the phone. Brandon Fuentes. presents with an anxious/animatedaffect and mood. Brandon Fuentes. is hyperverbal with religious connotations;thought process is tangential. Appears to be minimizing s/s. Remains paranoid with medications. Subjective-Denies having any SI/HI/AVH/Pain at this time. Brandon Fuentes. states " Nah, sleep is good. I'll just take the clonopin" . Needs redirection but is receptive. Is safeon the unit.  1:1 interaction in private to establish rapport. Encouragement, education, &support given from staff. Meds ordered and given. Remeron refused.  Safety maintained with Q 15 checks. Continues to follow treatment plan and will monitor closely. No additonal questions/concerns noted.

## 2016-04-13 NOTE — [facility] Group Notes (Signed)
[facility] LCSW Group Therapy  04/13/2016  1:05 PM  Type of Therapy:  Group therapy  Participation Level:  Active  Participation Quality:  Attentive  Affect:  Flat  Cognitive:  Oriented  Insight:  Limited  Engagement in Therapy:  Limited  Modes of Intervention:  Discussion, Socialization  Summary of Progress/Problems:  Chaplain was here to lead a group on themes of hope and courage. "Community is love.  We can learn from each other, and we care for each other."  Cited his mom and other patients as example of community. Multiple contributions and encouragement for others.  Daryel Geraldorth, Emireth Cockerham B 04/13/2016 1:27 PM

## 2016-04-13 NOTE — Progress Notes (Signed)
Recreation Therapy Notes  Date: 04/13/16 Time: 1000 Location: [address] Dayroom   Group Topic: Communication, Team Building, Problem Solving  Goal Area(s) Addresses:  Patient will effectively work with peer towards shared goal.  Patient will identify skill used to make activity successful.  Patient will identify how skills used during activity can be used to reach post d/c goals.   Behavioral Response: Engaged  Intervention: STEM Activity   Activity: [place]. Patients were provided the following materials: 5 drinking straws, 5 rubber bands, 5 paper clips, 2 index cards, 2 drinking cups, and 2 toilet paper rolls. Using the provided materials patients were asked to build a launching mechanisms to launch a ping pong ball approximately 12 feet. Patients were divided into teams of 3-5.   Education: Pharmacist, communityocial Skills, Building control surveyorDischarge Planning.   Education Outcome: Acknowledges education/In group clarification offered/Needs additional education.   Clinical Observations/Feedback: Pt was bright and active during group.  Pt stated his group used teamwork to complete the project.  Pt stated he would do as much as he could before passing it one to the next person to add on to it.  Pt also stated using these same skills with his support system helped him "realize I can't do everything and to ask for help".    Brandon RancherMarjette Alexza Fuentes, LRT/CTRS    Brandon RancherLindsay, [name] 04/13/2016 11:54 AM

## 2016-04-13 NOTE — [facility] Group Notes (Signed)
Adult Psychoeducational Group Note  Date:  04/13/2016 Time:  8:00 PM  Group Topic/Focus:  Wrap-Up Group:   The focus of this group is to help patients review their daily goal of treatment and discuss progress on daily workbooks.  Participation Level:  Active  Participation Quality:  Attentive  Affect:  Appropriate  Cognitive:  Alert  Insight: Good  Engagement in Group:  Engaged  Modes of Intervention:  Discussion and Education  Additional Comments:  Patient reported working on his discharge plan.  Patient confirmed using coping skills and taking his medication.  Patient stated he would continue "remaining positive."  Pier Bosher N 04/13/2016, 9:30 PM

## 2016-04-14 NOTE — Progress Notes (Signed)
Adult Psychoeducational Group Note  Date:  04/14/2016 Time:  8:47 PM  Group Topic/Focus:  Wrap-Up Group:   The focus of this group is to help patients review their daily goal of treatment and discuss progress on daily workbooks.  Participation Level:  Active  Participation Quality:  Appropriate  Affect:  Appropriate  Cognitive:  Appropriate  Insight: Appropriate  Engagement in Group:  Engaged  Modes of Intervention:  Discussion  Additional Comments: The patient expressed that he attended groups.The patient also said that he rates today a 10.  Octavio Mannshigpen, Leilanny Fluitt Lee 04/14/2016, 8:47 PM

## 2016-04-14 NOTE — [facility] Group Notes (Signed)
[facility] Group Notes:  (Clinical Social Work)  04/14/2016  11:15-12:00PM  Summary of Progress/Problems:   Today's process group involved patients discussing their feelings related to being hospitalized, as well as benefits they see to being in the hospital.  These were itemized on the whiteboard, and then the group brainstormed specific ways in which they could seek for those same benefits to happen when they discharge and go back home. The patient expressed a primary feeling about being hospitalized is good now, but was not so positive at first.  He feels he is getting the help he needs, and has decided he really needs to stay on his medications, but also track any changes he sees in himself in order to prevent future break-downs.  He stated he has achieved his goal of stability for 3 months, and next his goal is going to be to remain stable for 6 months.  Type of Therapy:  Group Therapy - Process  Participation Level:  Active  Participation Quality:  Attentive, Sharing and Supportive  Affect:  Appropriate  Cognitive:  Appropriate and Oriented  Insight:  Engaged  Engagement in Therapy:  Engaged  Modes of Intervention:  Exploration, Discussion  Brandon MantleMareida Grossman-Orr, LCSW 04/14/2016, 1:08 PM

## 2016-04-14 NOTE — Progress Notes (Signed)
[facility] MD Progress Note  04/14/2016 2:43 PM Brandon Fuentes  MRN:  [id number] Subjective: Patient in room states that he is doing ok, "no problem, waiting on my mom to bring me house clothes."  Objective:Patient seen and chart reviewed.  Discussed patient with treatment team.  Pt today seen as less anxious, relaxed in his room, states that he is not hearing voices nor is he paranoid. He states that he feels bored here and looking forward to discharge.   Pt reports better sleep last evening.   Tolerating Klonopin.   Will continue to treat.  Principal Problem: Bipolar disorder, curr episode mixed, severe, w/o psychotic features (HCC) Diagnosis:   Patient Active Problem List   Diagnosis Date Noted  . Bipolar disorder, curr episode mixed, severe, w/o psychotic features (HCC) [F31.63] 04/10/2016  . 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 Psychosis [F29] 04/01/2016   Total Time spent with patient: 25 minutes  Past Psychiatric History: Please see H&P.   Past Medical History:  Past Medical History:  Diagnosis Date  . Bipolar 1 disorder (HCC)    History reviewed. No pertinent surgical history. Family History:  Family History  Problem Relation Age of Onset  . Diabetes Mother    Family Psychiatric  History: Please see H&P.  Social History:  History  Alcohol Use No     History  Drug Use No    Social History   Social History  . Marital status: Single    Spouse name: N/A  . Number of children: N/A  . Years of education: N/A   Social History Main Topics  . Smoking status: Never Smoker  . Smokeless tobacco: Never Used  . Alcohol use No  . Drug use: No  . Sexual activity: Yes    Birth control/ protection: None   Other Topics Concern  . None   Social History Narrative  . None   Additional Social History:    Pain Medications: Denied abuse Prescriptions: Denied abuse Over the Counter: Denied abuse History of alcohol / drug use?: No history of alcohol / drug abuse                     Sleep: Fair  Appetite:  Fair  Current Medications: Current Facility-Administered Medications  Medication Dose Route Frequency Provider Last Rate Last Dose  . acetaminophen (TYLENOL) tablet 650 mg  650 mg Oral Q6H PRN Charm Rings, NP      . alum & mag hydroxide-simeth (MAALOX/MYLANTA) 200-200-20 MG/5ML suspension 30 mL  30 mL Oral Q4H PRN Charm Rings, NP      . amantadine (SYMMETREL) capsule 100 mg  100 mg Oral BID Charm Rings, NP   100 mg at 04/14/16 0817  . clonazePAM (KLONOPIN) tablet 0.25 mg  0.25 mg Oral QHS Jomarie Longs, MD   0.25 mg at 04/13/16 2128  . clonazePAM (KLONOPIN) tablet 0.5 mg  0.5 mg Oral BID Jomarie Longs, MD   0.5 mg at 04/14/16 0818  . hydrOXYzine (ATARAX/VISTARIL) tablet 25 mg  25 mg Oral Q6H PRN Charm Rings, NP      . lithium carbonate capsule 600 mg  600 mg Oral BID WC Charm Rings, NP   600 mg at 04/14/16 0817  . LORazepam (ATIVAN) tablet 2 mg  2 mg Oral BID PRN Jomarie Longs, MD       Or  . LORazepam (ATIVAN) injection 2 mg  2 mg Intramuscular BID PRN Jomarie Longs, MD      .  magnesium hydroxide (MILK OF MAGNESIA) suspension 30 mL  30 mL Oral Daily PRN Charm Rings, NP      . mirtazapine (REMERON) tablet 7.5 mg  7.5 mg Oral QHS Saramma Eappen, MD      . OXcarbazepine (TRILEPTAL) tablet 150 mg  150 mg Oral BID Jomarie Longs, MD   150 mg at 04/14/16 1610    Lab Results:  No results found for this or any previous visit (from the past 48 hour(s)).  Blood Alcohol level:  Lab Results  Component Value Date   [facility] <5 04/05/2016   ETH <5 03/30/2016    Metabolic Disorder Labs: Lab Results  Component Value Date   HGBA1C 5.8 (H) 04/11/2016   MPG 120 04/11/2016   MPG 128 (H) 07/14/2009   Lab Results  Component Value Date   PROLACTIN 16.4 (H) 04/11/2016   Lab Results  Component Value Date   CHOL 185 04/11/2016   TRIG 111 04/11/2016   HDL 38 (L) 04/11/2016   CHOLHDL 4.9 04/11/2016   VLDL 22 04/11/2016   LDLCALC  125 (H) 04/11/2016   LDLCALC  07/14/2009    105        Total Cholesterol/HDL:CHD Risk Coronary Heart Disease Risk Table                     Men   Women  1/2 Average Risk   3.4   3.3  Average Risk       5.0   4.4  2 X Average Risk   9.6   7.1  3 X Average Risk  23.4   11.0        Use the calculated Patient Ratio above and the CHD Risk Table to determine the patient's CHD Risk.        ATP III CLASSIFICATION (LDL):  <100     mg/dL   Optimal  960-454  mg/dL   Near or Above                    Optimal  130-159  mg/dL   Borderline  098-119  mg/dL   High  >147     mg/dL   Very High    Physical Findings: AIMS: Facial and Oral Movements Muscles of Facial Expression: None, normal Lips and Perioral Area: None, normal Jaw: None, normal Tongue: None, normal,Extremity Movements Upper (arms, wrists, hands, fingers): None, normal Lower (legs, knees, ankles, toes): None, normal, Trunk Movements Neck, shoulders, hips: None, normal, Overall Severity Severity of abnormal movements (highest score from questions above): None, normal Incapacitation due to abnormal movements: None, normal Patient's awareness of abnormal movements (rate only patient's report): No Awareness, Dental Status Current problems with teeth and/or dentures?: No Does patient usually wear dentures?: No  CIWA:    COWS:     Musculoskeletal: Strength & Muscle Tone: within normal limits Gait & Station: normal Patient leans: N/A  Psychiatric Specialty Exam: Physical Exam  Nursing note and vitals reviewed.   Review of Systems  Psychiatric/Behavioral: The patient is nervous/anxious and has insomnia.   All other systems reviewed and are negative.   Blood pressure 112/79, pulse 78, temperature 98 F (36.7 C), resp. rate 20, height 6\' 1"  (1.854 m), weight (!) 138.3 kg (305 lb), SpO2 100 %.Body mass index is 40.24 kg/m.  General Appearance: Fairly Groomed  Eye Contact:  Fair  Speech:  Normal Rate  Volume:  Normal  Mood:   Anxious  Affect:  Labile  Thought Process:  Goal Directed and Descriptions of Associations: Circumstantial  Orientation:  Full (Time, Place, and Person)  Thought Content:  Rumination  Suicidal Thoughts:  No  Homicidal Thoughts:  No  Memory:  Immediate;   Fair Recent;   Fair Remote;   Fair  Judgement:  Fair  Insight:  Present  Psychomotor Activity:  Restlessness  Concentration:  Concentration: Poor and Attention Span: Poor  Recall:  [employer]  Fund of Knowledge:  Fair  Language:  Fair  Akathisia:  No  Handed:  Right  AIMS (if indicated):     Assets:  Desire for Improvement  ADL's:  Intact  Cognition:  WNL  Sleep:  Number of Hours: 3   Bipolar disorder, curr episode mixed, severe, w/o psychotic features (HCC) unstable  Will continue today 04/14/16  plan as below except where it is noted.  Treatment Plan Summary:Pt today seem as less anxious , reports sleep continues to be an issue . Discussed medication changes , will continue treatment. Pt with hx of NMS , risk is higher when antipsychotics combined with Dierdre SearlesLi. Daily contact with patient to assess and evaluate symptoms and progress in treatment, Medication management and Plan see below Continue Li 600 mg po bid for mood sx. Li level reviewed as therapeutic.  Will get another level on - 04/15/16. Trilpetal 150 mg po bid for mood lability, augment Li. Will change Ativan to Klonopin 0.5 mg po bid and 0.25 mg po qhs for anxiety sx. Will change Ambien to Remeron 7.5 mg po qhs for sleep problems. EKG for qtc reviewed - shows sinuc tach , qtc - wnl. BMP repeat shows K+ as wnl , hba1c- 5.8. CSW will continue to work on disposition.  Lindwood QuaSheila May Zaedyn Covin, NP [facility] 04/14/2016, 2:43 PM

## 2016-04-14 NOTE — Progress Notes (Signed)
DAR NOTE: Patient presents with pleasant affect and mood.  Reports good night sleep.  Patient states medication is working well for him.  Patient is future-oriented, discussed going back to school after discharge Denies pain, auditory and visual hallucinations.  Described energy level as normal and concentration as good.  Rates depression at 0, hopelessness at 0, and anxiety at 0.  Maintained on routine safety checks.  Medications given as prescribed.  Support and encouragement offered as needed.  Attended group and participated.  States goal for today is "coping skills and discharge plan."  Patient observed socializing with peers in the dayroom.  Offered no complaint.

## 2016-04-14 NOTE — Progress Notes (Signed)
Writer has observed patient up and active on the unit. He attended group and has been interacting with peers. He reports to Clinical research associatewriter that his goal is to stay on his medications once discharge. He reports that he enjoys the groups and learning coping skills. He remains hyper religious and restless. He was compliant with his scheduled medications. Support given and safety maintained on unit with 15 min checks.

## 2016-04-15 NOTE — [facility] Group Notes (Signed)
[facility] Group Notes:  (Clinical Social Work)  04/15/2016  11:00AM-12:00PM  Summary of Progress/Problems:  The main focus of today's process group was to listen to a variety of genres of music and to identify that different types of music provoke different responses.  The patient then was able to identify personally what was soothing for them, as well as energizing, as well as how patient can personally use this knowledge in sleep habits, with depression, and with other symptoms.  The patient expressed at the beginning of group the overall feeling of good and participated as fully as possible throughout group.  He was interactive, descriptive and very engaged.  Type of Therapy:  Music Therapy   Participation Level:  Active  Participation Quality:  Attentive and Sharing  Affect:  Blunted  Cognitive:  Oriented  Insight:  Engaged  Engagement in Therapy:  Engaged  Modes of Intervention:   Activity, Exploration  Ambrose MantleMareida Grossman-Orr, LCSW 04/15/2016

## 2016-04-15 NOTE — Progress Notes (Signed)
Adult Psychoeducational Group Note  Date:  04/15/2016 Time:  9:05 PM  Group Topic/Focus:  Wrap-Up Group:   The focus of this group is to help patients review their daily goal of treatment and discuss progress on daily workbooks.  Participation Level:  Active  Participation Quality:  Appropriate  Affect:  Blunted  Cognitive:  Alert and Oriented  Insight: Improving  Engagement in Group:  Improving  Modes of Intervention:  Discussion and Support  Additional Comments:  Patient states "my day was a 10/10. I got to play basketball. My goal is to work on Pharmacologistcoping skills. I got to talk to [facility] students and it was great".  Rae Lipsmanda A Alazne Quant 04/15/2016, 9:05 PM

## 2016-04-15 NOTE — Progress Notes (Signed)
[facility] MD Progress Note  04/15/2016 1:51 PM Luiscarlos Kaczmarczyk  MRN:  [id number] Subjective:  Patient is seen in hallway talking to another peer. Objective:   Patient seen and chart reviewed.  Discussed patient with treatment team.  Patient is quiet, seems to forward little about how he is doing.  He is hoping to be discharged soon.  There are no reports of disruptive behavior and he is also compliant with his medications.   He denies SI, HI, no paranoia observed.  Principal Problem: Bipolar disorder, curr episode mixed, severe, w/o psychotic features (HCC) Diagnosis:   Patient Active Problem List   Diagnosis Date Noted  . Bipolar disorder, curr episode mixed, severe, w/o psychotic features (HCC) [F31.63] 04/10/2016  . 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 Psychosis [F29] 04/01/2016   Total Time spent with patient: 25 minutes  Past Psychiatric History: Please see H&P.   Past Medical History:  Past Medical History:  Diagnosis Date  . Bipolar 1 disorder (HCC)    History reviewed. No pertinent surgical history. Family History:  Family History  Problem Relation Age of Onset  . Diabetes Mother    Family Psychiatric  History: Please see H&P.  Social History:  History  Alcohol Use No     History  Drug Use No    Social History   Social History  . Marital status: Single    Spouse name: N/A  . Number of children: N/A  . Years of education: N/A   Social History Main Topics  . Smoking status: Never Smoker  . Smokeless tobacco: Never Used  . Alcohol use No  . Drug use: No  . Sexual activity: Yes    Birth control/ protection: None   Other Topics Concern  . None   Social History Narrative  . None   Additional Social History:    Pain Medications: Denied abuse Prescriptions: Denied abuse Over the Counter: Denied abuse History of alcohol / drug use?: No history of alcohol / drug abuse                    Sleep: Fair  Appetite:  Fair  Current  Medications: Current Facility-Administered Medications  Medication Dose Route Frequency Provider Last Rate Last Dose  . acetaminophen (TYLENOL) tablet 650 mg  650 mg Oral Q6H PRN Charm Rings, NP      . alum & mag hydroxide-simeth (MAALOX/MYLANTA) 200-200-20 MG/5ML suspension 30 mL  30 mL Oral Q4H PRN Charm Rings, NP      . amantadine (SYMMETREL) capsule 100 mg  100 mg Oral BID Charm Rings, NP   100 mg at 04/15/16 0752  . clonazePAM (KLONOPIN) tablet 0.25 mg  0.25 mg Oral QHS Jomarie Longs, MD   0.25 mg at 04/14/16 2122  . clonazePAM (KLONOPIN) tablet 0.5 mg  0.5 mg Oral BID Jomarie Longs, MD   0.5 mg at 04/15/16 0753  . hydrOXYzine (ATARAX/VISTARIL) tablet 25 mg  25 mg Oral Q6H PRN Charm Rings, NP      . lithium carbonate capsule 600 mg  600 mg Oral BID WC Charm Rings, NP   600 mg at 04/15/16 0752  . LORazepam (ATIVAN) tablet 2 mg  2 mg Oral BID PRN Jomarie Longs, MD       Or  . LORazepam (ATIVAN) injection 2 mg  2 mg Intramuscular BID PRN Jomarie Longs, MD      . magnesium hydroxide (MILK OF MAGNESIA) suspension 30 mL  30 mL Oral  Daily PRN Charm Rings, NP      . mirtazapine (REMERON) tablet 7.5 mg  7.5 mg Oral QHS Jomarie Longs, MD   7.5 mg at 04/14/16 2122  . OXcarbazepine (TRILEPTAL) tablet 150 mg  150 mg Oral BID Jomarie Longs, MD   150 mg at 04/15/16 0753    Lab Results:  No results found for this or any previous visit (from the past 48 hour(s)).  Blood Alcohol level:  Lab Results  Component Value Date   [facility] <5 04/05/2016   ETH <5 03/30/2016    Metabolic Disorder Labs: Lab Results  Component Value Date   HGBA1C 5.8 (H) 04/11/2016   MPG 120 04/11/2016   MPG 128 (H) 07/14/2009   Lab Results  Component Value Date   PROLACTIN 16.4 (H) 04/11/2016   Lab Results  Component Value Date   CHOL 185 04/11/2016   TRIG 111 04/11/2016   HDL 38 (L) 04/11/2016   CHOLHDL 4.9 04/11/2016   VLDL 22 04/11/2016   LDLCALC 125 (H) 04/11/2016   LDLCALC  07/14/2009     105        Total Cholesterol/HDL:CHD Risk Coronary Heart Disease Risk Table                     Men   Women  1/2 Average Risk   3.4   3.3  Average Risk       5.0   4.4  2 X Average Risk   9.6   7.1  3 X Average Risk  23.4   11.0        Use the calculated Patient Ratio above and the CHD Risk Table to determine the patient's CHD Risk.        ATP III CLASSIFICATION (LDL):  <100     mg/dL   Optimal  161-096  mg/dL   Near or Above                    Optimal  130-159  mg/dL   Borderline  045-409  mg/dL   High  >811     mg/dL   Very High    Physical Findings: AIMS: Facial and Oral Movements Muscles of Facial Expression: None, normal Lips and Perioral Area: None, normal Jaw: None, normal Tongue: None, normal,Extremity Movements Upper (arms, wrists, hands, fingers): None, normal Lower (legs, knees, ankles, toes): None, normal, Trunk Movements Neck, shoulders, hips: None, normal, Overall Severity Severity of abnormal movements (highest score from questions above): None, normal Incapacitation due to abnormal movements: None, normal Patient's awareness of abnormal movements (rate only patient's report): No Awareness, Dental Status Current problems with teeth and/or dentures?: No Does patient usually wear dentures?: No  CIWA:    COWS:     Musculoskeletal: Strength & Muscle Tone: within normal limits Gait & Station: normal Patient leans: N/A  Psychiatric Specialty Exam: Physical Exam  Nursing note and vitals reviewed.   Review of Systems  Psychiatric/Behavioral: The patient is nervous/anxious and has insomnia.   All other systems reviewed and are negative.   Blood pressure 124/82, pulse 84, temperature 98.6 F (37 C), temperature source Oral, resp. rate 20, height 6\' 1"  (1.854 m), weight (!) 138.3 kg (305 lb), SpO2 100 %.Body mass index is 40.24 kg/m.  General Appearance: Fairly Groomed  Eye Contact:  Fair  Speech:  Normal Rate  Volume:  Normal  Mood:  Anxious  Affect:   Labile  Thought Process:  Goal Directed and Descriptions of  Associations: Circumstantial  Orientation:  Full (Time, Place, and Person)  Thought Content:  Rumination  Suicidal Thoughts:  No  Homicidal Thoughts:  No  Memory:  Immediate;   Fair Recent;   Fair Remote;   Fair  Judgement:  Fair  Insight:  Present  Psychomotor Activity:  Restlessness  Concentration:  Concentration: Poor and Attention Span: Poor  Recall:  [employer]Fair  Fund of Knowledge:  Fair  Language:  Fair  Akathisia:  No  Handed:  Right  AIMS (if indicated):     Assets:  Desire for Improvement  ADL's:  Intact  Cognition:  WNL  Sleep:  Number of Hours: 5   Bipolar disorder, curr episode mixed, severe, w/o psychotic features (HCC) unstable  Will continue today 04/15/16  plan as below except where it is noted.  Treatment Plan Summary:  Pt today seem as less anxious, quiet,  States that sleep has improved.  Discussed medication changes, will continue treatment.  Pt with hx of NMS , risk is higher when antipsychotics combined with Dierdre SearlesLi. Daily contact with patient to assess and evaluate symptoms and progress in treatment, Medication management and Plan see below Continue Li 600 mg po bid for mood sx. Li level reviewed as therapeutic.  Will get another level on - 04/16/16. Trilpetal 150 mg po bid for mood lability, augment Li. Klonopin 0.5 mg po bid and 0.25 mg po qhs for anxiety sx. Cont Remeron 7.5 mg po qhs for sleep problems. EKG for qtc reviewed - shows sinus tach , qtc - wnl. BMP repeat shows K+ as wnl , hba1c- 5.8. CSW will continue to work on disposition.  Lindwood QuaSheila May Edker Punt, NP [facility] 04/15/2016, 1:51 PM

## 2016-04-15 NOTE — Progress Notes (Signed)
Patient has been observed mostly on the phone this evening after his mother visited, she spoke with me about her concerns. She is concerned because he is so focused on God and the calling he has on his life. He is anxious and hyper verbal. He seems to get paranoid mostly if direct eye contact is made too long. He was compliant with his medications and denies si/hi/a/v hallucinations. Support given and safety maintained on unit with 15 min checks.

## 2016-04-15 NOTE — Progress Notes (Signed)
Patient ID: Brandon Fuentes, male   DOB: Feb 05, 1990, 26 y.o.   MRN: [id number]   D: Pt started out the morning very pleasant and appropriate. He took all medications as prescribed, no other issues or concerns noted. He attended all groups and engaged in treatment. Pt reported on his self inventory sheet that his depression was a 0, his hopelessness was a 0, and his anxiety was a 0. Pt reported that his goal for today was to work on coping skills and discharge plans. Pt went outside for recreation and came back very agitated and irritable, as him and another patient was arguing over religion. This Clinical research associatewriter spoke to patient and allowed him to vent, he reported that he was happy that he was allowed to vent. Pt reported that people were mad because he can see can see things, and that that was the calling that God put on his life so why were people mad. Pt reported being negative SI/HI, no AH/VH noted. A: 15 min checks continued for patient safety. R: Pt safety maintained.

## 2016-04-16 LAB — LITHIUM LEVEL: Lithium Lvl: 0.51 mmol/L — ABNORMAL LOW (ref 0.60–1.20)

## 2016-04-16 MED ORDER — LITHIUM CARBONATE 300 MG PO CAPS
300.0000 mg | ORAL_CAPSULE | Freq: Every day | ORAL | Status: DC
  Administered 2016-04-16: 300 mg via ORAL
  Filled 2016-04-16 (×2): qty 1

## 2016-04-16 MED ORDER — MIRTAZAPINE 7.5 MG PO TABS: 7.5000 mg | ORAL_TABLET | Freq: Every day | ORAL | 0 refills | Status: DC

## 2016-04-16 MED ORDER — OXCARBAZEPINE 150 MG PO TABS: 150.0000 mg | ORAL_TABLET | Freq: Two times a day (BID) | ORAL | 0 refills | Status: DC

## 2016-04-16 MED ORDER — LITHIUM CARBONATE 600 MG PO CAPS: 600.0000 mg | ORAL_CAPSULE | Freq: Two times a day (BID) | ORAL | 0 refills | Status: DC

## 2016-04-16 MED ORDER — HYDROXYZINE HCL 25 MG PO TABS: 25.0000 mg | ORAL_TABLET | Freq: Four times a day (QID) | ORAL | 0 refills | Status: DC | PRN

## 2016-04-16 MED ORDER — LITHIUM CARBONATE 300 MG PO CAPS: 300.0000 mg | ORAL_CAPSULE | Freq: Every day | ORAL | 0 refills | Status: DC

## 2016-04-16 MED ORDER — CLONAZEPAM 0.5 MG PO TABS: 0.5000 mg | ORAL_TABLET | Freq: Two times a day (BID) | ORAL | 0 refills | Status: DC

## 2016-04-16 MED ORDER — AMANTADINE HCL 100 MG PO CAPS: 100.0000 mg | ORAL_CAPSULE | Freq: Two times a day (BID) | ORAL | 0 refills | Status: DC

## 2016-04-16 NOTE — Discharge Summary (Signed)
Physician Discharge Summary Note  Patient:  Brandon Fuentes is an 26 y.o., male MRN:  [id number] DOB:  02-12-1990 Patient phone:  [phone] (home)  Patient address:   [address] [place] [address],  Total Time spent with patient: 30 minutes  Date of Admission:  04/09/2016 Date of Discharge: 04/16/2016  Reason for Admission: Per HPI- Brandon Fuentes is a 19 y old AAM, who is single , on SSD, who lives with his mother in [place] , [place], relocated from [place] . Pt presented with worsening mood sx as well as anxiety sx to the [facility].Patient seen and chart reviewed.Discussed patient with treatment team. Pt today seen as pleasant , does have pressured speech , on and off anxiety /mood lability on the unit. Pt however was able to sit down and communicate with Clinical research associate and answer all questions appropriately. Pt reports he was on depakote and lithium before , however he has not been taking his medications on a regular basis and hence started feeling anxious again. Per patient he feels anxious and somewhat depressed often. Pt did not report any paranoia or other psychotic sx to Clinical research associate . However per initial notes in EHR - pt reported paranoia while in ED. Attempted to contact mother - Brandon Fuentes - no response Per review of previous notes in EHR - Pt had several admissions to IP units . Pt also has a hx of NMS while at [employer]. Pt did well on combination of depakote and Lithium along with klonazepam.   Principal Problem: Bipolar disorder, curr episode mixed, severe, w/o psychotic features [facility]) Discharge Diagnoses: Patient Active Problem List   Diagnosis Date Noted  . Bipolar disorder, curr episode mixed, severe, w/o psychotic features (HCC) [F31.63] 04/10/2016  . 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 Psychosis [F29] 04/01/2016    Past Psychiatric History:   Past Medical History:  Past Medical History:  Diagnosis Date  . Bipolar 1 disorder (HCC)    History reviewed. No  pertinent surgical history. Family History:  Family History  Problem Relation Age of Onset  . Diabetes Mother    Family Psychiatric  History:  Social History:  History  Alcohol Use No     History  Drug Use No    Social History   Social History  . Marital status: Single    Spouse name: N/A  . Number of children: N/A  . Years of education: N/A   Social History Main Topics  . Smoking status: Never Smoker  . Smokeless tobacco: Never Used  . Alcohol use No  . Drug use: No  . Sexual activity: Yes    Birth control/ protection: None   Other Topics Concern  . None   Social History Narrative  . None    Hospital Course:  Brandon Fuentes was admitted for Bipolar disorder, curr episode mixed, severe, w/o psychotic features (HCC) crisis management.  Pt was treated discharged with the medications listed below under Medication List.  Medical problems were identified and treated as needed.  Home medications were restarted as appropriate.  Improvement was monitored by observation and Brandon Fuentes 's daily report of symptom reduction.  Emotional and mental status was monitored by daily self-inventory reports completed by Brandon Fuentes and clinical staff.         Brandon Fuentes was evaluated by the treatment team for stability and plans for continued recovery upon discharge. Brandon Fuentes 's motivation was an integral factor for scheduling further treatment. Employment, transportation, bed availability, health status, family  support, and any pending legal issues were also considered during hospital stay. Pt was offered further treatment options upon discharge including but not limited to Residential, Intensive Outpatient, and Outpatient treatment.  Brandon AuerSherwood Fuentes will follow up with the services as listed below under Follow Up Information.     Upon completion of this admission the patient was both mentally and medically stable for discharge denying suicidal/homicidal ideation,  auditory/visual/tactile hallucinations, delusional thoughts and paranoia.    Brandon AuerSherwood Fuentes responded well to treatment with Remeron 7.5 mg , Trileptal 150 mg, Symmetrel 100mg  and Klonopin 0.5 mg. Pt demonstrated improvement without reported or observed adverse effects to the point of stability appropriate for outpatient management. Pertinent labs include: Lithium level 0.51, Lipid panel   Prolactin 16.4 (high), Hgb A1C 5.8 (high), for which outpatient follow-up is necessary for lab recheck as mentioned below. Reviewed CBC, CMP, BAL, and UDS; all unremarkable aside from noted exceptions.   Physical Findings: AIMS: Facial and Oral Movements Muscles of Facial Expression: None, normal Lips and Perioral Area: None, normal Jaw: None, normal Tongue: None, normal,Extremity Movements Upper (arms, wrists, hands, fingers): None, normal Lower (legs, knees, ankles, toes): None, normal, Trunk Movements Neck, shoulders, hips: None, normal, Overall Severity Severity of abnormal movements (highest score from questions above): None, normal Incapacitation due to abnormal movements: None, normal Patient's awareness of abnormal movements (rate only patient's report): No Awareness, Dental Status Current problems with teeth and/or dentures?: No Does patient usually wear dentures?: No  CIWA:    COWS:     Musculoskeletal: Strength & Muscle Tone: within normal limits Gait & Station: normal Patient leans: N/A  Psychiatric Specialty Exam: See SRA by MD Physical Exam  Vitals reviewed. Constitutional: He is oriented to person, place, and time.  Neurological: He is alert and oriented to person, place, and time.  Psychiatric: He has a normal mood and affect. His behavior is normal.    Review of Systems  Psychiatric/Behavioral: Negative for depression (stable) and suicidal ideas. The patient is not nervous/anxious.     Blood pressure 121/70, pulse 78, temperature 97.9 F (36.6 C), temperature source Oral, resp.  rate 18, height 6\' 1"  (1.854 m), weight (!) 138.3 kg (305 lb), SpO2 100 %.Body mass index is 40.24 kg/m.   Have you used any form of tobacco in the last 30 days? (Cigarettes, Smokeless Tobacco, Cigars, and/or Pipes): No  Has this patient used any form of tobacco in the last 30 days? (Cigarettes, Smokeless Tobacco, Cigars, and/or Pipes) Yes, No  Blood Alcohol level:  Lab Results  Component Value Date   ETH <5 04/05/2016   ETH <5 03/30/2016    Metabolic Disorder Labs:  Lab Results  Component Value Date   HGBA1C 5.8 (H) 04/11/2016   MPG 120 04/11/2016   MPG 128 (H) 07/14/2009   Lab Results  Component Value Date   PROLACTIN 16.4 (H) 04/11/2016   Lab Results  Component Value Date   CHOL 185 04/11/2016   TRIG 111 04/11/2016   HDL 38 (L) 04/11/2016   CHOLHDL 4.9 04/11/2016   VLDL 22 04/11/2016   LDLCALC 125 (H) 04/11/2016   LDLCALC  07/14/2009    105        Total Cholesterol/HDL:CHD Risk Coronary Heart Disease Risk Table                     Men   Women  1/2 Average Risk   3.4   3.3  Average Risk  5.0   4.4  2 X Average Risk   9.6   7.1  3 X Average Risk  23.4   11.0        Use the calculated Patient Ratio above and the CHD Risk Table to determine the patient's CHD Risk.        ATP III CLASSIFICATION (LDL):  <100     mg/dL   Optimal  563-875  mg/dL   Near or Above                    Optimal  130-159  mg/dL   Borderline  643-329  mg/dL   High  >518     mg/dL   Very High    See Psychiatric Specialty Exam and Suicide Risk Assessment completed by Attending Physician prior to discharge.  Discharge destination:  Home  Is patient on multiple antipsychotic therapies at discharge:  No   Has Patient had three or more failed trials of antipsychotic monotherapy by history:  No  Recommended Plan for Multiple Antipsychotic Therapies: NA  Discharge Instructions    Diet - low sodium heart healthy    Complete by:  As directed    Discharge instructions    Complete by:   As directed    Take all medications as prescribed. Keep all follow-up appointments as scheduled.  Do not consume alcohol or use illegal drugs while on prescription medications. Report any adverse effects from your medications to your primary care provider promptly.  In the event of recurrent symptoms or worsening symptoms, call 911, a crisis hotline, or go to the nearest emergency department for evaluation.   Increase activity slowly    Complete by:  As directed      Allergies as of 04/16/2016      Reactions   Depakote [divalproex Sodium] Other (See Comments)   Sleepiness, anger   Zoloft [sertraline Hcl]    anger      Medication List    TAKE these medications     Indication  amantadine 100 MG capsule Commonly known as:  SYMMETREL Take 1 capsule (100 mg total) by mouth 2 (two) times daily.  Indication:  Extrapyramidal Reaction caused by Medications   clonazePAM 0.5 MG tablet Commonly known as:  KLONOPIN Take 1 tablet (0.5 mg total) by mouth 2 (two) times daily.  Indication:  Manic-Depression   hydrOXYzine 25 MG tablet Commonly known as:  ATARAX/VISTARIL Take 1 tablet (25 mg total) by mouth every 6 (six) hours as needed for anxiety.  Indication:  Anxiety Neurosis   lithium 600 MG capsule Take 1 capsule (600 mg total) by mouth 2 (two) times daily with a meal. What changed:  medication strength  how much to take  when to take this  Indication:  Manic-Depression   lithium carbonate 300 MG capsule Take 1 capsule (300 mg total) by mouth daily with lunch. What changed:  medication strength  how much to take  when to take this  Indication:  Manic-Depression, Depression   mirtazapine 7.5 MG tablet Commonly known as:  REMERON Take 1 tablet (7.5 mg total) by mouth at bedtime.  Indication:  Major Depressive Disorder   OXcarbazepine 150 MG tablet Commonly known as:  TRILEPTAL Take 1 tablet (150 mg total) by mouth 2 (two) times daily.  Indication:  Diabetes with  Nerve Disease      Follow-up Information    [facility] Follow up on 04/25/2016.   Specialty:  Behavioral Health Why:  Wednesday @ 2pm with Idalia Needle  Then,Dr. Eskir Monday 4/2 @ 2pm Contact information: [address] [place] [address] [phone]          Follow-up recommendations:  Activity:  as tolerated Diet:  heart healthy Tests:  lithum level due 04/23/2016  Comments:  Take all medications as prescribed. Keep all follow-up appointments as scheduled.  Do not consume alcohol or use illegal drugs while on prescription medications. Report any adverse effects from your medications to your primary care provider promptly.  In the event of recurrent symptoms or worsening symptoms, call 911, a crisis hotline, or go to the nearest emergency department for evaluation.   Signed: Oneta Rack, NP 04/16/2016, 10:42 AM

## 2016-04-16 NOTE — Progress Notes (Signed)
  [facility] Discharge [facility] :  Will you be returning to the same living situation after discharge:  Yes,  home At discharge, do you have transportation home?: Yes,  mother Do you have the ability to pay for your medications: Yes,  insurance  Release of information consent forms completed and in the chart;  Patient's signature needed at discharge.  Patient to Follow up at: Follow-up Information    [facility] Follow up on 04/25/2016.   Specialty:  Behavioral Health Why:   Wednesday @ 2pm with Idalia NeedleBeth Mackenzie  Then,Dr. Eskir Monday 4/2 @ 2pm Contact information: [address] [place] [id number] [phone]          Next level of care provider has [facility]:yes  Safety Planning and Suicide Prevention discussed: Yes,  yes  Have you used any form of tobacco in the last 30 days? (Cigarettes, Smokeless Tobacco, Cigars, and/or Pipes): No  Has patient been referred to the Quitline?: N/A patient is not a smoker  Patient has been referred for addiction treatment: N/A  Brandon Fuentes 04/16/2016, 10:20 AM

## 2016-04-16 NOTE — Plan of Care (Signed)
Problem: [facility] Participation in Recreation Therapeutic Interventions Goal: STG-Patient will demonstrate improved communication skills b STG: Communication - Patient will improve communication skills, as demonstrated by ability to actively participate in at least 2 processing discussion during recreation therapy group sessions by conclusion of recreation therapy tx  Outcome: Completed/Met Date Met: 04/16/16 Pt was able to show improved communication by conclusion of coping skills, anger management and self-esteem recreation therapy groups.  Victorino Sparrow, LRT/CTRS

## 2016-04-16 NOTE — Progress Notes (Signed)
Nursing Discharge Note 04/16/2016 0700-1405  Data Reports sleeping good without PRN sleep med.  Rates depression 0/10, hopelessness 0/10, and anxiety 0/10. Affect wide ranged and appropriate mood euthymic.  Denies HI, SI, AVH.  Patient attending groups, appropriate and polite.  Patient talkative and coherent during med pass.  Slighty rapid speech approaching lunch time, but appropriate.  Action Spoke with patient 1:1, nurse offered support to patient throughout shift..  Reviewed medications, discharge instructions, and follow up appointments with patient. Medication scripts reviewed and given to patient.  Paperwork, AVS, SRA, and transition record handed to patient.   Escorted off of unit at 1405. Belongings returned per belongings form.  Discharged to lobby where patient was met by "dad" (called for ride earlier).  Response Verbalized understanding of discharge teaching. Agrees to contact someone or 911 with thoughts/intent to harm self or others.    To follow up per AVS.

## 2016-04-16 NOTE — Tx Team (Signed)
Interdisciplinary Treatment and Diagnostic Plan Update  04/16/2016 Time of Session: 8:36 AM  Brandon Fuentes MRN: [id number]  Principal Diagnosis: Bipolar disorder, curr episode mixed, severe, w/o psychotic features ([place])  Secondary Diagnoses: Principal Problem:   Bipolar disorder, curr episode mixed, severe, w/o psychotic features ([place])   Current Medications:  Current Facility-Administered Medications  Medication Dose Route Frequency Provider Last Rate Last Dose  . acetaminophen (TYLENOL) tablet 650 mg  650 mg Oral Q6H PRN Patrecia Pour, NP      . alum & mag hydroxide-simeth (MAALOX/MYLANTA) 200-200-20 MG/5ML suspension 30 mL  30 mL Oral Q4H PRN Patrecia Pour, NP      . amantadine (SYMMETREL) capsule 100 mg  100 mg Oral BID Patrecia Pour, NP   100 mg at 04/16/16 0741  . clonazePAM (KLONOPIN) tablet 0.25 mg  0.25 mg Oral QHS Ursula Alert, MD   0.25 mg at 04/15/16 2203  . clonazePAM (KLONOPIN) tablet 0.5 mg  0.5 mg Oral BID Ursula Alert, MD   0.5 mg at 04/16/16 0741  . hydrOXYzine (ATARAX/VISTARIL) tablet 25 mg  25 mg Oral Q6H PRN Patrecia Pour, NP      . lithium carbonate capsule 600 mg  600 mg Oral BID WC Patrecia Pour, NP   600 mg at 04/16/16 0741  . magnesium hydroxide (MILK OF MAGNESIA) suspension 30 mL  30 mL Oral Daily PRN Patrecia Pour, NP      . mirtazapine (REMERON) tablet 7.5 mg  7.5 mg Oral QHS Ursula Alert, MD   7.5 mg at 04/15/16 2202  . OXcarbazepine (TRILEPTAL) tablet 150 mg  150 mg Oral BID Ursula Alert, MD   150 mg at 04/16/16 0741    PTA Medications: Prescriptions Prior to Admission  Medication Sig Dispense Refill Last Dose  . hydrOXYzine (ATARAX/VISTARIL) 25 MG tablet Take 1 tablet (25 mg total) by mouth every 6 (six) hours as needed for anxiety. 30 tablet 0   . lithium carbonate 300 MG capsule Take 600-900 mg by mouth 2 (two) times daily.   04/04/2016 at Unknown time  . lithium carbonate 600 MG capsule Take 1 capsule (600 mg total) by mouth 2 (two) times  daily. 60 capsule 0     Treatment Modalities: Medication Management, Group therapy, Case management,  1 to 1 session with clinician, Psychoeducation, Recreational therapy.   Physician Treatment Plan for Primary Diagnosis: Bipolar disorder, curr episode mixed, severe, w/o psychotic features ([place]) Long Term Goal(s): Improvement in symptoms so as ready for discharge  Short Term Goals: Ability to verbalize feelings will improve   Medication Management: Evaluate patient's response, side effects, and tolerance of medication regimen.  Therapeutic Interventions: 1 to 1 sessions, Unit Group sessions and Medication administration.  Evaluation of Outcomes: Adequate for Discharge  Physician Treatment Plan for Secondary Diagnosis: Principal Problem:   Bipolar disorder, curr episode mixed, severe, w/o psychotic features ([place])   Long Term Goal(s): Improvement in symptoms so as ready for discharge  Short Term Goals: Ability to verbalize feelings will improve Compliance with prescribed medications will improve Ability to verbalize feelings will improve Compliance with prescribed medications will improve  Medication Management: Evaluate patient's response, side effects, and tolerance of medication regimen.  Therapeutic Interventions: 1 to 1 sessions, Unit Group sessions and Medication administration.  Evaluation of Outcomes: Adequate for Discharge   RN Treatment Plan for Primary Diagnosis: Bipolar disorder, curr episode mixed, severe, w/o psychotic features ([place]) Long Term Goal(s): Knowledge of disease and therapeutic regimen to maintain  health will improve  Short Term Goals: Ability to identify and develop effective coping behaviors will improve and Compliance with prescribed medications will improve  Medication Management: RN will administer medications as ordered by provider, will assess and evaluate patient's response and provide education to patient for prescribed medication. RN will  report any adverse and/or side effects to prescribing provider.  Therapeutic Interventions: 1 on 1 counseling sessions, Psychoeducation, Medication administration, Evaluate responses to treatment, Monitor vital signs and CBGs as ordered, Perform/monitor CIWA, COWS, AIMS and Fall Risk screenings as ordered, Perform wound care treatments as ordered.  Evaluation of Outcomes: Adequate for Discharge    Recreational Therapy Treatment Plan for Primary Diagnosis: Bipolar disorder, curr episode mixed, severe, w/o psychotic features ([place]) Long Term Goal(s): Patient will participate in recreation therapy treatment in at least 2 group sessions without prompting from LRT  Short Term Goals:  Without prompting or encouragement patient will spontaneously contribute to discussions during at least 2 recreation therapy group sessions by conclusion of recreation therapy tx.  Treatment Modalities: Group and Pet Therapy  Therapeutic Interventions: Psychoeducation  Evaluation of Outcomes: Adequate for Discharge   LCSW Treatment Plan for Primary Diagnosis: Bipolar disorder, curr episode mixed, severe, w/o psychotic features ([place]) Long Term Goal(s): Safe transition to appropriate next level of care at discharge, Engage patient in therapeutic group addressing interpersonal concerns.  Short Term Goals: Engage patient in aftercare planning with referrals and resources  Therapeutic Interventions: Assess for all discharge needs, 1 to 1 time with Social worker, Explore available resources and support systems, Assess for adequacy in community support network, Educate family and significant other(s) on suicide prevention, Complete Psychosocial Assessment, Interpersonal group therapy.  Evaluation of Outcomes: Met  Return home with mother, follow up [facility]   Progress in Treatment: Attending groups: Yes Participating in groups: Yes Taking medication as prescribed: Yes Toleration medication: Yes, no side effects  reported at this time Family/Significant other contact made: Yes Patient understands diagnosis: Yes AEB asking for help with mood stabilization, anxiety Discussing patient identified problems/goals with staff: Yes Medical problems stabilized or resolved: Yes Denies suicidal/homicidal ideation: Yes Issues/concerns per patient self-inventory: None Other: N/A  New problem(s) identified: None identified at this time.   New Short Term/Long Term Goal(s): None identified at this time.   Discharge Plan or Barriers:   Reason for Continuation of Hospitalization:   Estimated Length of Stay: D/C today  Attendees: Patient: 04/16/2016  8:36 AM  Physician: Ursula Alert, MD 04/16/2016  8:36 AM  Nursing: Otilio Carpen, RN 04/16/2016  8:36 AM  RN Care Manager: Lars Pinks, RN 04/16/2016  8:36 AM  Social Worker: Ripley Fraise 04/16/2016  8:36 AM  Recreational Therapist: Laretta Bolster  04/16/2016  8:36 AM  Other: Norberto Sorenson 04/16/2016  8:36 AM  Other:  04/16/2016  8:36 AM    Scribe for Treatment Team:  Roque Lias LCSW 04/16/2016 8:36 AM

## 2016-04-16 NOTE — [facility] Suicide Risk Assessment (Signed)
[facility]   Principal Problem: Bipolar disorder, curr episode mixed, severe, w/o psychotic features [facility](HCC) Discharge Diagnoses:  Patient Active Problem List   Diagnosis Date Noted  . Bipolar disorder, curr episode mixed, severe, w/o psychotic features (HCC) [F31.63] 04/10/2016  . 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 Psychosis [F29] 04/01/2016    Total Time spent with patient: 30 minutes  Musculoskeletal: Strength & Muscle Tone: within normal limits Gait & Station: normal Patient leans: N/A  Psychiatric Specialty Exam: Review of Systems  Psychiatric/Behavioral: Negative for hallucinations and suicidal ideas. The patient is not nervous/anxious and does not have insomnia.   All other systems reviewed and are negative.   Blood pressure 121/70, pulse 78, temperature 97.9 F (36.6 C), temperature source Oral, resp. rate 18, height 6\' 1"  (1.854 m), weight (!) 138.3 kg (305 lb), SpO2 100 %.Body mass index is 40.24 kg/m.  General Appearance: Casual  Eye Contact::  Fair  Speech:  Clear and Coherent409  Volume:  Normal  Mood:  Euthymic  Affect:  Appropriate  Thought Process:  Goal Directed and Descriptions of Associations: Intact  Orientation:  Full (Time, Place, and Person)  Thought Content:  Logical  Suicidal Thoughts:  No  Homicidal Thoughts:  No  Memory:  Immediate;   Fair Recent;   Fair Remote;   Fair  Judgement:  Fair  Insight:  Fair  Psychomotor Activity:  Normal  Concentration:  Fair  Recall:  FiservFair  Fund of Knowledge:Fair  Language: Fair  Akathisia:  No  Handed:  Right  AIMS (if indicated):     Assets:  Communication Skills Desire for Improvement  Sleep:  Number of Hours: 5  Cognition: WNL  ADL's:  Intact   Mental Status Per Nursing Assessment::   On Admission:  NA  Demographic Factors:  Male  Loss Factors: NA  Historical Factors: Impulsivity  Risk Reduction Factors:   Living with another person, especially a relative,  Positive social support and Positive therapeutic relationship  Continued Clinical Symptoms:  Previous Psychiatric Diagnoses and Treatments  Cognitive Features That Contribute To Risk:  None    Suicide Risk:  Minimal: No identifiable suicidal ideation.  Patients presenting with no risk factors but with morbid ruminations; may be classified as minimal risk based on the severity of the depressive symptoms  Follow-up Information    [facility] Follow up on 04/25/2016.   Specialty:  Behavioral Health Why:   Wednesday @ 2pm with Idalia NeedleBeth Mackenzie  Then,Dr. Eskir Monday 4/2 @ 2pm Contact information: [address] [place] [id number] [phone]          Plan Of Care/Follow-up recommendations: Jake SharkSandra Minerva - contacted mother - [phone] -  Patient was also present during conversation. Writer discussed with mother about how patient was making progress, per treatment team this AM , pt was doing well, slept ok last night, interactive in milieu , no disruptive issues noted. However mother reported pt is hyper religious and she does not feel he is ready yet. Discussed with mother that if religion is something positive for him, that is not a criteria for continued stay on the inpatient unit. Discussed that pt was appropriate in milieu, denies SI/HI/AH/[facility] Keto/Paranoia and was tolerating medications well , is goal directed , wants to get help with his outpatient provider , and wants to stay on medications. Mother also concerned that writer did not call her earlier , last week. Discussed with her that Clinical research associatewriter had called  her the same day patient was admitted ,as well as another day per request , however it always went in to voicemail and that is why CSW had contacted her to discuss treatment /disposition plan.  Activity:  no restrictions Diet:  regular Tests:  Li level on 04/20/16 , friday , prior to AM dose of Lithium Other:   none  Alayah Knouff, MD 04/16/2016, 10:25 AM

## 2016-04-16 NOTE — Progress Notes (Signed)
Recreation Therapy Notes  Date: 04/16/16 Time: 1000 Location: 500 Hall Dayroom  Group Topic: Coping Skills  Goal Area(s) Addresses:  Patient will be able to identify positive coping skills. Patient will be able to identify the benefits of coping skills. Patient will be able to identify benefits of using coping skills post d/c.  Behavioral Response: Engaged  Intervention: Magazines, glue sticks, scissors, coping skills worksheet  Activity: Patients were to identify coping skills that could be used for diversions, social, cognitive, tension releasers and physically.  Patients were to find pictures in the magazines that depicted the coping skills they would use for each of these areas and glue them to their worksheet.  Education:Coping Skills, Discharge Planning.   Education Outcome: Acknowledges understanding/In group clarification offered/Needs additional education.   Clinical Observations/Feedback: Pt was active and engaged during group.  Pt identified Taraj P. Henson as a Therapist, artdistraction, cooking as social, great ideas as cognitive, singing as a tension releaser and smiling as physical.  Pt stated focusing on his positive coping skills would allow him to overcome in times of adversity by using what he has learned.   Caroll RancherMarjette Miarose Lippert, LRT/CTRS        Caroll RancherLindsay, Woodson Macha A 04/16/2016 12:03 PM

## 2016-04-17 NOTE — Progress Notes (Signed)
Spoke with Lise AuerSherwood Jesson mom, Jake SharkSandra Isip.  Informed that patient will need the Lithium level this Friday 04/20/2016.  She will inform the patient.

## 2016-04-19 ENCOUNTER — Encounter (HOSPITAL_COMMUNITY): Payer: Self-pay

## 2016-04-19 DIAGNOSIS — Z760 Encounter for issue of repeat prescription: Secondary | ICD-10-CM | POA: Insufficient documentation

## 2016-04-19 DIAGNOSIS — F22 Delusional disorders: Secondary | ICD-10-CM | POA: Insufficient documentation

## 2016-04-19 NOTE — ED Notes (Signed)
Pt came to nurse first angry when checking in and stating that he was parked out front and that he was not going to move his car he would like it towed. Explained to pt that he can not leave is car out front and he needs to move it to a parking spot or have someone else move it. Pt states that his family has already made him mad and he will have a new car tomorrow, pt asks for security to "please tow my car I will not move it and the keys are in it" Pt states that he wants his medication filled and he does not care if he has to be IVC to get it.

## 2016-04-19 NOTE — ED Triage Notes (Addendum)
Pt reports he received a prescription on March 19th for Lithium and a few other medications but states he gave the prescriptions to his mother and he states he is unable to find her and get them filled today so he decided to come here today because he needs his medications. Pt easily angered during triage, very defensive and argumentative with this RN.  Denies SI/HI.

## 2016-04-20 ENCOUNTER — Emergency Department (HOSPITAL_COMMUNITY)
Admission: EM | Admit: 2016-04-20 | Discharge: 2016-04-20 | Disposition: A | Discharge status: Home / Self Care | Payer: 59 | Attending: Emergency Medicine | Admitting: Emergency Medicine

## 2016-04-20 DIAGNOSIS — Z760 Encounter for issue of repeat prescription: Secondary | ICD-10-CM

## 2016-04-20 DIAGNOSIS — F22 Delusional disorders: Secondary | ICD-10-CM

## 2016-04-20 NOTE — ED Notes (Signed)
Pt verbalized understanding of discharge instructions and follow up care. Pt stated he understood that his prescriptions were dropped off at his usual pharmacy. Pt escorted out with security.

## 2016-04-20 NOTE — ED Notes (Signed)
Pt walking around in front of the nurses station, encouraged to return to his room. Security present, pt calm and cooperative at this time.

## 2016-04-20 NOTE — ED Provider Notes (Signed)
[facility]-EMERGENCY DEPT Provider Note   CSN: [id number] Arrival date & time: 04/19/16  2227     History   Chief Complaint Chief Complaint  Patient presents with  . Medication Refill    HPI Brandon Fuentes is a 26 y.o. male.  26 year old male with a history of bipolar 1 disorder presents to the emergency department requesting his medications. He states that he gave his prescriptions to his mother after discharge from the hospital 4 days ago, but has not received his prescriptions to take. He is requesting a refill of these prescriptions. Patient with no additional complaints. He expresses mild thoughts of paranoia toward his mother not filling these prescriptions. He alludes to people "manipulating" the situation, preventing him from receiving his medications. No suicidal or homicidal ideations expressed. Patient mildly agitated, pacing in his room and presenting frequently at his exam room door.     Past Medical History:  Diagnosis Date  . Bipolar 1 disorder [facility](HCC)     Patient Active Problem List   Diagnosis Date Noted  . Bipolar disorder, curr episode mixed, severe, w/o psychotic features (HCC) 04/10/2016  . 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 Psychosis 04/01/2016    History reviewed. No pertinent surgical history.    Home Medications    Prior to Admission medications   Medication Sig Start Date End Date Taking? Authorizing Provider  amantadine (SYMMETREL) 100 MG capsule Take 1 capsule (100 mg total) by mouth 2 (two) times daily. 04/16/16   Oneta Rackanika N Lewis, NP  clonazePAM (KLONOPIN) 0.5 MG tablet Take 1 tablet (0.5 mg total) by mouth 2 (two) times daily. 04/16/16   Oneta Rackanika N Lewis, NP  hydrOXYzine (ATARAX/VISTARIL) 25 MG tablet Take 1 tablet (25 mg total) by mouth every 6 (six) hours as needed for anxiety. 04/16/16   Oneta Rackanika N Lewis, NP  lithium carbonate 300 MG capsule Take 1 capsule (300 mg total) by mouth daily with lunch. 04/16/16   Oneta Rackanika N Lewis, NP  lithium carbonate 600 MG  capsule Take 1 capsule (600 mg total) by mouth 2 (two) times daily with a meal. 04/16/16   Oneta Rackanika N Lewis, NP  mirtazapine (REMERON) 7.5 MG tablet Take 1 tablet (7.5 mg total) by mouth at bedtime. 04/16/16   Oneta Rackanika N Lewis, NP  OXcarbazepine (TRILEPTAL) 150 MG tablet Take 1 tablet (150 mg total) by mouth 2 (two) times daily. 04/16/16   Oneta Rackanika N Lewis, NP    Family History Family History  Problem Relation Age of Onset  . Diabetes Mother     Social History Social History  Substance Use Topics  . Smoking status: Never Smoker  . Smokeless tobacco: Never Used  . Alcohol use No     Allergies   Depakote [divalproex sodium] and Zoloft [sertraline hcl]   Review of Systems Review of Systems Ten systems reviewed and are negative for acute change, except as noted in the HPI.    Physical Exam Updated Vital Signs BP (!) 146/96 (BP Location: Left Arm)   Pulse 80   Temp 98 F (36.7 C) (Oral)   Resp 16   SpO2 100%   Physical Exam  Constitutional: He is oriented to person, place, and time. He appears well-developed and well-nourished. No distress.  Nontoxic and in NAD  HENT:  Head: Normocephalic and atraumatic.  Eyes: Conjunctivae and EOM are normal. No scleral icterus.  Neck: Normal range of motion.  Pulmonary/Chest: Effort normal. No respiratory distress.  Respirations even and unlabored  Musculoskeletal: Normal range of motion.  Neurological: He  is alert and oriented to person, place, and time. He exhibits normal muscle tone. Coordination normal.  Skin: Skin is warm and dry. No rash noted. He is not diaphoretic. No erythema. No pallor.  Psychiatric: His speech is normal. He is agitated (mild).  Hyperreligious verbiage. No expressed SI or HI. Mildly agitated.  Nursing note and vitals reviewed.    ED Treatments / Results  Labs (all labs ordered are listed, but only abnormal results are displayed) Labs Reviewed - No data to display  EKG  EKG Interpretation None        Radiology No results found.  Procedures Procedures (including critical care time)  Medications Ordered in ED Medications - No data to display   Initial Impression / Assessment and Plan / ED Course  I have reviewed the triage vital signs and the nursing notes.  Pertinent labs & imaging results that were available during my care of the patient were reviewed by me and considered in my medical decision making (see chart for details).     2:03 AM I have spoken with the patient's mother. She reports that she brought the patient's prescriptions to [facility] pharmacy yesterday morning. She states they should be available for pickup today. She does express concern about her son's mental state. She states that she has not seen a large change in his demeanor since discharge. I have encouraged the mother to have the patient take his medications once they are filled as these seemed to have managed his symptoms well while he was inpatient at behavioral health. I have explained that I do not see indication for involuntary commitment as the patient has not expressed any suicidal or homicidal ideations. Mother verbalizes understanding.   2:10 AM Patient given discharge papers without difficulty. He verbalizes understanding of his need to pick these prescriptions up today at [facility]. Patient ambulated out of the department in stable condition.   Final Clinical Impressions(s) / ED Diagnoses   Final diagnoses:  Medication refill  Paranoia [facility])    New Prescriptions New Prescriptions   No medications on file     Antony Madura, PA-C 04/20/16 0225    Tomasita Crumble, MD 04/20/16 [phone]

## 2016-04-20 NOTE — Discharge Instructions (Signed)
[facility] They will be ready for pick up later today. We advised the take your medications as prescribed. You may present to behavioral health for any new or concerning symptoms or questions concerning your medication regimen.

## 2016-04-25 ENCOUNTER — Encounter (HOSPITAL_COMMUNITY): Payer: Self-pay | Admitting: Licensed Clinical Social Worker

## 2016-04-25 ENCOUNTER — Ambulatory Visit (INDEPENDENT_AMBULATORY_CARE_PROVIDER_SITE_OTHER): Payer: 59 | Admitting: Licensed Clinical Social Worker

## 2016-04-25 DIAGNOSIS — F3163 Bipolar disorder, current episode mixed, severe, without psychotic features: Secondary | ICD-10-CM | POA: Diagnosis not present
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
## 2016-04-30 ENCOUNTER — Ambulatory Visit (HOSPITAL_COMMUNITY): Payer: Self-pay | Admitting: Psychiatry

## 2016-06-04 ENCOUNTER — Encounter (HOSPITAL_COMMUNITY): Payer: Self-pay

## 2016-06-04 ENCOUNTER — Emergency Department (HOSPITAL_COMMUNITY)
Admission: EM | Admit: 2016-06-04 | Discharge: 2016-06-06 | Disposition: A | Discharge status: Other Acute Inpatient Hospital | Payer: 59 | Attending: Emergency Medicine | Admitting: Emergency Medicine

## 2016-06-04 DIAGNOSIS — Z9114 Patient's other noncompliance with medication regimen: Secondary | ICD-10-CM | POA: Diagnosis not present

## 2016-06-04 DIAGNOSIS — F39 Unspecified mood [affective] disorder: Secondary | ICD-10-CM | POA: Diagnosis not present

## 2016-06-04 DIAGNOSIS — F319 Bipolar disorder, unspecified: Secondary | ICD-10-CM | POA: Diagnosis not present

## 2016-06-04 DIAGNOSIS — F315 Bipolar disorder, current episode depressed, severe, with psychotic features: Secondary | ICD-10-CM | POA: Diagnosis not present

## 2016-06-04 DIAGNOSIS — Z79899 Other long term (current) drug therapy: Secondary | ICD-10-CM | POA: Diagnosis not present

## 2016-06-04 DIAGNOSIS — F061 Catatonic disorder due to known physiological condition: Secondary | ICD-10-CM | POA: Diagnosis not present

## 2016-06-04 DIAGNOSIS — R42 Dizziness and giddiness: Secondary | ICD-10-CM | POA: Diagnosis present

## 2016-06-04 LAB — LITHIUM LEVEL: LITHIUM LVL: 0.46 mmol/L — AB (ref 0.60–1.20)

## 2016-06-04 LAB — CBC WITH DIFFERENTIAL/PLATELET
Basophils Absolute: 0.1 10*3/uL (ref 0.0–0.1)
Basophils Relative: 0 %
Eosinophils Absolute: 0.2 10*3/uL (ref 0.0–0.7)
Eosinophils Relative: 1 %
HEMATOCRIT: 42.5 % (ref 39.0–52.0)
Hemoglobin: 14.4 g/dL (ref 13.0–17.0)
LYMPHS PCT: 21 %
Lymphs Abs: 2.7 10*3/uL (ref 0.7–4.0)
MCH: 26.6 pg (ref 26.0–34.0)
MCHC: 33.9 g/dL (ref 30.0–36.0)
MCV: 78.6 fL (ref 78.0–100.0)
MONO ABS: 0.7 10*3/uL (ref 0.1–1.0)
MONOS PCT: 6 %
NEUTROS ABS: 9.5 10*3/uL — AB (ref 1.7–7.7)
Neutrophils Relative %: 72 %
Platelets: 243 10*3/uL (ref 150–400)
RBC: 5.41 MIL/uL (ref 4.22–5.81)
RDW: 13.3 % (ref 11.5–15.5)
WBC: 13.2 10*3/uL — ABNORMAL HIGH (ref 4.0–10.5)

## 2016-06-04 LAB — COMPREHENSIVE METABOLIC PANEL
ALT: 81 U/L — AB (ref 17–63)
AST: 60 U/L — AB (ref 15–41)
Albumin: 4.4 g/dL (ref 3.5–5.0)
Alkaline Phosphatase: 64 U/L (ref 38–126)
Anion gap: 11 (ref 5–15)
BILIRUBIN TOTAL: 1.1 mg/dL (ref 0.3–1.2)
BUN: 9 mg/dL (ref 6–20)
CHLORIDE: 103 mmol/L (ref 101–111)
CO2: 23 mmol/L (ref 22–32)
Calcium: 9.4 mg/dL (ref 8.9–10.3)
Creatinine, Ser: 1.22 mg/dL (ref 0.61–1.24)
GFR calc Af Amer: >60 mL/min (ref >60)
GFR calc non Af Amer: >60 mL/min (ref >60)
GLUCOSE: 96 mg/dL (ref 65–99)
POTASSIUM: 3.3 mmol/L — AB (ref 3.5–5.1)
Sodium: 137 mmol/L (ref 135–145)
TOTAL PROTEIN: 7.6 g/dL (ref 6.5–8.1)

## 2016-06-04 LAB — RAPID URINE DRUG SCREEN, HOSP PERFORMED
Amphetamines: NOT DETECTED
BARBITURATES: NOT DETECTED
Benzodiazepines: NOT DETECTED
Cocaine: NOT DETECTED
Opiates: NOT DETECTED
Tetrahydrocannabinol: NOT DETECTED

## 2016-06-04 LAB — ETHANOL: Alcohol, Ethyl (B): <5 mg/dL (ref <5)

## 2016-06-04 LAB — CBG MONITORING, ED: Glucose-Capillary: 99 mg/dL (ref 65–99)

## 2016-06-04 NOTE — ED Triage Notes (Signed)
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 c/o dizziness and "emotionally unstable" Denies SI to EMS.

## 2016-06-04 NOTE — ED Notes (Signed)
Pt changed into wine color scrubs. Pt mother stated she will take belongings home with her. UA collected and sent to lab

## 2016-06-04 NOTE — BH Assessment (Signed)
Attempted consult; however, pt refused to engage.  Pt appeared very agitated and when attempting to engage with him, pt balled up his fist, began breathing deep.  Pt was informed we would try back later when he is ready to engage.  Informed nurse of the matter. Nurse suggest we try the consult in the am.  Nurse was asked to pull the consult and resubmit when the pt is ready to engage.

## 2016-06-04 NOTE — ED Notes (Addendum)
BH counselor at bedside, unable to do assessment, pt wont talk to counselor.

## 2016-06-04 NOTE — ED Notes (Signed)
Pt transported to another room. Pt retains glasses.

## 2016-06-04 NOTE — ED Provider Notes (Signed)
[facility]-EMERGENCY DEPT Provider Note   CSN: [id number] Arrival date & time: 06/04/16  1436     History   Chief Complaint Chief Complaint  Patient presents with  . Dizziness  . Psychiatric Evaluation    HPI Brandon Fuentes is a 26 y.o. male who presents for psych eval. Level 5 caveat for psych condition. He is a poor historian. He has not been taking his medicines and states that he's had a lot of trauma in his life which has triggered his bipolar symptoms. He reports "feeling bad" and "doesn't know what to do". 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 He denies pain, recent drug use, and states he "means no harm".    HPI  Past Medical History:  Diagnosis Date  . Bipolar 1 disorder [facility])     Patient Active Problem List   Diagnosis Date Noted  . Bipolar disorder, curr episode mixed, severe, w/o psychotic features (HCC) 04/10/2016  . 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 Psychosis 04/01/2016    History reviewed. No pertinent surgical history.     Home Medications    Prior to Admission medications   Medication Sig Start Date End Date Taking? Authorizing Provider  amantadine (SYMMETREL) 100 MG capsule Take 1 capsule (100 mg total) by mouth 2 (two) times daily. 04/16/16   Oneta Rack, NP  clonazePAM (KLONOPIN) 0.5 MG tablet Take 1 tablet (0.5 mg total) by mouth 2 (two) times daily. 04/16/16   Oneta Rack, NP  hydrOXYzine (ATARAX/VISTARIL) 25 MG tablet Take 1 tablet (25 mg total) by mouth every 6 (six) hours as needed for anxiety. 04/16/16   Oneta Rack, NP  lithium carbonate 300 MG capsule Take 1 capsule (300 mg total) by mouth daily with lunch. 04/16/16   Oneta Rack, NP  lithium carbonate 600 MG capsule Take 1 capsule (600 mg total) by mouth 2 (two) times daily with a meal. 04/16/16   Oneta Rack, NP  mirtazapine (REMERON) 7.5 MG tablet Take 1 tablet (7.5 mg total) by mouth at  bedtime. 04/16/16   Oneta Rack, NP  OXcarbazepine (TRILEPTAL) 150 MG tablet Take 1 tablet (150 mg total) by mouth 2 (two) times daily. 04/16/16   Oneta Rack, NP    Family History Family History  Problem Relation Age of Onset  . Diabetes Mother     Social History Social History  Substance Use Topics  . Smoking status: Never Smoker  . Smokeless tobacco: Never Used  . Alcohol use No     Allergies   Depakote [divalproex sodium] and Zoloft [sertraline hcl]   Review of Systems Review of Systems  Unable to perform ROS: Psychiatric disorder     Physical Exam Updated Vital Signs BP (!) 152/70 (BP Location: Left Arm)   Pulse 81   Temp 99 F (37.2 C) (Oral)   Resp 19   SpO2 100%   Physical Exam  Constitutional: He is oriented to person, place, and time. He appears well-developed and well-nourished. No distress.  Obese AA male in NAD.   HENT:  Head: Normocephalic and atraumatic.  Eyes: Conjunctivae are normal. Pupils are equal, round, and reactive to light. Right eye exhibits no discharge. Left eye exhibits no discharge. No scleral icterus.  Neck: Normal range of motion.  Cardiovascular: Normal rate and regular rhythm.  Exam reveals no gallop and no friction rub.   No murmur heard.  Pulmonary/Chest: Effort normal and breath sounds normal. No respiratory distress. He has no wheezes. He has no rales. He exhibits no tenderness.  Abdominal: Soft. Bowel sounds are normal. He exhibits no distension and no mass. There is no tenderness. There is no rebound and no guarding. No hernia.  Neurological: He is alert and oriented to person, place, and time.  Skin: Skin is warm and dry.  Psychiatric: His affect is blunt. His speech is delayed. He is slowed and withdrawn. Cognition and memory are impaired. He expresses impulsivity. He expresses no homicidal ideation. He expresses no homicidal plans. He is noncommunicative.  Nursing note and vitals reviewed.    ED Treatments /  Results  Labs (all labs ordered are listed, but only abnormal results are displayed) Labs Reviewed  COMPREHENSIVE METABOLIC PANEL - Abnormal; Notable for the following:       Result Value   Potassium 3.3 (*)    AST 60 (*)    ALT 81 (*)    All other components within normal limits  CBC WITH DIFFERENTIAL/PLATELET - Abnormal; Notable for the following:    WBC 13.2 (*)    Neutro Abs 9.5 (*)    All other components within normal limits  LITHIUM LEVEL - Abnormal; Notable for the following:    Lithium Lvl 0.46 (*)    All other components within normal limits  ETHANOL  RAPID URINE DRUG SCREEN, HOSP PERFORMED  CBG MONITORING, ED    EKG  EKG Interpretation  Date/Time:  Monday Jun 04 2016 16:23:40 EDT Ventricular Rate:  86 PR Interval:    QRS Duration: 84 QT Interval:  387 QTC Calculation: 463 R Axis:   69 Text Interpretation:  Sinus rhythm Borderline T wave abnormalities agree. no change from previous Confirmed by Donnald GarrePfeiffer, MD, Lebron ConnersMarcy [phone] on 06/04/2016 4:33:52 PM       Radiology No results found.  Procedures Procedures (including critical care time)  Medications Ordered in ED Medications - No data to display   Initial Impression / Assessment and Plan / ED Course  I have reviewed the triage vital signs and the nursing notes.  Pertinent labs & imaging results that were available during my care of the patient were reviewed by me and considered in my medical decision making (see chart for details).  26 year old male with bipolar d/o. He is hypertensive but otherwise vitals are normal. Labs are remarkable for mild hypokalemia and mild elevation of transaminases. UDS is clean. EKG is NSR. Pt medically cleared for TTS consult.  Final Clinical Impressions(s) / ED Diagnoses   Final diagnoses:  Bipolar 1 disorder [facility](HCC)    New Prescriptions New Prescriptions   No medications on file     Beryle QuantGekas, Kathryn Cosby Marie, PA-C 06/04/16 2101    Arby BarrettePfeiffer, Marcy, MD 06/08/16 [phone]

## 2016-06-04 NOTE — ED Notes (Signed)
Pt has had dinner at bedside for 30 minutes without touching it. Pt is not responding to questions. The only response from pt was that he was too tired to talk. Pt sitting quietly in room.

## 2016-06-05 DIAGNOSIS — F061 Catatonic disorder due to known physiological condition: Secondary | ICD-10-CM | POA: Diagnosis not present

## 2016-06-05 DIAGNOSIS — F315 Bipolar disorder, current episode depressed, severe, with psychotic features: Secondary | ICD-10-CM

## 2016-06-05 DIAGNOSIS — Z9114 Patient's other noncompliance with medication regimen: Secondary | ICD-10-CM | POA: Diagnosis not present

## 2016-06-05 DIAGNOSIS — F39 Unspecified mood [affective] disorder: Secondary | ICD-10-CM

## 2016-06-05 MED ORDER — MIRTAZAPINE 15 MG PO TABS
7.5000 mg | ORAL_TABLET | Freq: Every day | ORAL | Status: DC
  Administered 2016-06-05: 7.5 mg via ORAL
  Filled 2016-06-05: qty 1

## 2016-06-05 MED ORDER — ZIPRASIDONE MESYLATE 20 MG IM SOLR
20.0000 mg | Freq: Once | INTRAMUSCULAR | Status: AC
  Administered 2016-06-05: 20 mg via INTRAMUSCULAR
  Filled 2016-06-05: qty 20

## 2016-06-05 MED ORDER — LITHIUM CARBONATE 300 MG PO CAPS
300.0000 mg | ORAL_CAPSULE | Freq: Every day | ORAL | Status: DC
  Administered 2016-06-05 – 2016-06-06 (×2): 300 mg via ORAL
  Filled 2016-06-05 (×2): qty 1

## 2016-06-05 MED ORDER — PANTOPRAZOLE SODIUM 40 MG PO TBEC
40.0000 mg | DELAYED_RELEASE_TABLET | Freq: Every day | ORAL | Status: DC
  Administered 2016-06-05 – 2016-06-06 (×2): 40 mg via ORAL
  Filled 2016-06-05 (×2): qty 1

## 2016-06-05 MED ORDER — LITHIUM CARBONATE 300 MG PO CAPS
600.0000 mg | ORAL_CAPSULE | Freq: Two times a day (BID) | ORAL | Status: DC
  Administered 2016-06-05 – 2016-06-06 (×2): 600 mg via ORAL
  Filled 2016-06-05 (×2): qty 2

## 2016-06-05 MED ORDER — HYDROXYZINE HCL 50 MG PO TABS
50.0000 mg | ORAL_TABLET | Freq: Once | ORAL | Status: AC
  Administered 2016-06-05: 50 mg via ORAL
  Filled 2016-06-05: qty 1

## 2016-06-05 MED ORDER — STERILE WATER FOR INJECTION IJ SOLN
INTRAMUSCULAR | Status: AC
  Filled 2016-06-05: qty 10

## 2016-06-05 MED ORDER — OXCARBAZEPINE 300 MG PO TABS
150.0000 mg | ORAL_TABLET | Freq: Two times a day (BID) | ORAL | Status: DC
  Administered 2016-06-05 – 2016-06-06 (×3): 150 mg via ORAL
  Filled 2016-06-05 (×3): qty 1

## 2016-06-05 MED ORDER — RISPERIDONE 1 MG PO TABS
1.0000 mg | ORAL_TABLET | Freq: Once | ORAL | Status: AC
  Administered 2016-06-05: 1 mg via ORAL
  Filled 2016-06-05: qty 1

## 2016-06-05 MED ORDER — HYDROXYZINE HCL 25 MG PO TABS: 25.0000 mg | ORAL_TABLET | Freq: Four times a day (QID) | ORAL | Status: DC | PRN

## 2016-06-05 MED ORDER — CLONAZEPAM 0.5 MG PO TABS
0.5000 mg | ORAL_TABLET | Freq: Two times a day (BID) | ORAL | Status: DC
  Administered 2016-06-05 – 2016-06-06 (×3): 0.5 mg via ORAL
  Filled 2016-06-05 (×3): qty 1

## 2016-06-05 MED ORDER — AMANTADINE HCL 100 MG PO CAPS
100.0000 mg | ORAL_CAPSULE | Freq: Two times a day (BID) | ORAL | Status: DC
  Administered 2016-06-05 – 2016-06-06 (×2): 100 mg via ORAL
  Filled 2016-06-05 (×3): qty 1

## 2016-06-05 NOTE — ED Notes (Signed)
Pt staring off into space

## 2016-06-05 NOTE — ED Notes (Signed)
Patient Refused to eat, and refused a snack and Drink.

## 2016-06-05 NOTE — ED Notes (Signed)
A snack and drink was placed on patient bedside table, and a Regular Diet was ordered for Lunch.

## 2016-06-05 NOTE — ED Notes (Signed)
Pt is resting in bed

## 2016-06-05 NOTE — ED Provider Notes (Signed)
26 year old male who presented with "feeling bad", being seen directing traffic and the [employer] parking [place]. Patient had not provided very much history, due to appearance of catatonic state.  TTS has been consulted.   While awaiting TTS consult, patient has become very agitated, pacing around the room, yelling at the top of his lungs. We have concern that he is a threat to himself and others due to mental illness and agitation. He was given Geodon IM.  An IVC was placed. His mom was at bedside and reports that the patient had told her that he was hearing voices, and that last night, the voices had told him to hurt her which is why he had left the house. He had reported the voices that told him to hurt himself, and that is why he exited the car in the [employer] parking lot. On my exam at this time, patient is responding to internal stimuli.  IVC placed and family updated of plan of care.  Psychiatry to evaluate.   CRITICAL CARE: acute agitation Performed by: Rhae LernerSchlossman, Simon Llamas Elizabeth   Total critical care time: 30 minutes  Critical care time was exclusive of separately billable procedures and treating other patients.  Critical care was necessary to treat or prevent imminent or life-threatening deterioration.  Critical care was time spent personally by me on the following activities: development of treatment plan with patient and/or surrogate as well as nursing, discussions with consultants, evaluation of patient's response to treatment, examination of patient, obtaining history from patient or surrogate, ordering and performing treatments and interventions, ordering and review of laboratory studies, ordering and review of radiographic studies, pulse oximetry and re-evaluation of patient's condition.    Alvira MondaySchlossman, Kurstin Dimarzo, MD 06/05/16 [phone]

## 2016-06-05 NOTE — ED Notes (Signed)
Mother called this RN and stated, "He had an episode in March when he wouldn't speak. He will complete one full sentence and then he will shut down."

## 2016-06-05 NOTE — ED Notes (Signed)
IVC papers served - copy faxed to [facility], copy sent to Medical Records, original placed in folder for Magistrate, and all 3 sets on clipboard.  

## 2016-06-05 NOTE — ED Notes (Signed)
Psychiatrist at bedside. Pt pants changed, soaked with urine.

## 2016-06-05 NOTE — ED Notes (Signed)
Pt screaming at the top of his lungs. Security at bedside.

## 2016-06-05 NOTE — ED Notes (Signed)
IVC paperwork filled out.

## 2016-06-05 NOTE — ED Notes (Signed)
Nurse at bedside speaking with patient. Pts eyes are wide, breathing quickly. Dr. Dalene SeltzerSchlossman notified, filling out IVC paperwork.

## 2016-06-05 NOTE — ED Notes (Addendum)
Pt spoke with this RN during vitals, pt voice excessively soft, when asked pt if he had any thoughts of SI/HI pt states "I was just so confused, ever since I was little" Pt asked if he knew where he was an he repeated this statement, pt then states "I just don't feel good." Asked pt why does he not feel good and he repeats again "I was just so confused since I was little."

## 2016-06-05 NOTE — ED Notes (Signed)
Pt is answering questions, very difficult to understand. Mumbling.

## 2016-06-05 NOTE — ED Notes (Signed)
Mother at bedside. Pt calm and sitting at the end of the bed

## 2016-06-05 NOTE — Consult Note (Signed)
[place] Psychiatry Consult   Reason for Consult:  Bipolar disorder and refuses telepsych evaluation Referring Physician:  Dr. Dwyane Dee Patient Identification: Sie Formisano MRN:  [id number] Principal Diagnosis: <principal problem not specified> Diagnosis:   Patient Active Problem List   Diagnosis Date Noted  . Bipolar disorder, curr episode mixed, severe, w/o psychotic features ([place]) [F31.63] 04/10/2016  . 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 Psychosis [F29] 04/01/2016    Total Time spent with patient: 1 hour  Subjective:   Zhyon Antenucci is a 26 y.o. male patient admitted with mood swings and non compliant with medications.  HPI: Jakhari Space is a 26 y.o. male, seen, chart reviewed and case discussed with the staff RN in the emergency department and also patient mother on phone. Patient has been diagnosed with bipolar disorder usually very talkative but during this evaluation, is quite and become poor historian, occasionally whispering here and there which seems to be difficult to understand. Patient staff knows reported he has been calm and cooperative since he received his medication this morning and also reportedly had an episode of intermittent agitation and required security to be called in. Reportedly patient has been acting bizarre by dissecting traffic in the [employer] parking lot before came to the hospital. Patient mother reported patient has driven from home without specific aim her destination. Patient mom also reported he has been compliant with his medication even though he missed one of his previous scheduled medication management with the: [facility] outpatient physician. Reportedly he has scheduled appointment in May 6 but it is not clear why he missed this appointment. Patient is psychiatrically and emotionally unstable at this time meets criteria for acute psychiatric hospitalization for crisis stabilization and also safety monitoring. .    Past Psychiatric  History: Bipolar disorder and non compliance with out patient treatment.  Risk to Self: Is patient at risk for suicide?: No Risk to Others:   Prior Inpatient Therapy:   Prior Outpatient Therapy:    Past Medical History:  Past Medical History:  Diagnosis Date  . Bipolar 1 disorder ([place])    History reviewed. No pertinent surgical history. Family History:  Family History  Problem Relation Age of Onset  . Diabetes Mother    Family Psychiatric  History: Noncontributory Social History:  History  Alcohol Use No     History  Drug Use No    Social History   Social History  . Marital status: Single    Spouse name: N/A  . Number of children: N/A  . Years of education: N/A   Social History Main Topics  . Smoking status: Never Smoker  . Smokeless tobacco: Never Used  . Alcohol use No  . Drug use: No  . Sexual activity: Yes    Birth control/ protection: None   Other Topics Concern  . None   Social History Narrative  . None   Additional Social History:    Allergies:   Allergies  Allergen Reactions  . Depakote [Divalproex Sodium] Other (See Comments)    Sleepiness, anger  . Zoloft [Sertraline Hcl]     anger    Labs:  Results for orders placed or performed during the hospital encounter of 06/04/16 (from the past 48 hour(s))  CBG monitoring, ED     Status: None   Collection Time: 06/04/16  2:25 PM  Result Value Ref Range   Glucose-Capillary 99 65 - 99 mg/dL  Comprehensive metabolic panel     Status: Abnormal   Collection Time: 06/04/16  3:45 PM  Result Value Ref Range   Sodium 137 135 - 145 mmol/L   Potassium 3.3 (L) 3.5 - 5.1 mmol/L   Chloride 103 101 - 111 mmol/L   CO2 23 22 - 32 mmol/L   Glucose, Bld 96 65 - 99 mg/dL   BUN 9 6 - 20 mg/dL   Creatinine, Ser 1.22 0.61 - 1.24 mg/dL   Calcium 9.4 8.9 - 10.3 mg/dL   Total Protein 7.6 6.5 - 8.1 g/dL   Albumin 4.4 3.5 - 5.0 g/dL   AST 60 (H) 15 - 41 U/L   ALT 81 (H) 17 - 63 U/L   Alkaline Phosphatase 64 38 -  126 U/L   Total Bilirubin 1.1 0.3 - 1.2 mg/dL   GFR calc non Af Amer >60 >60 mL/min   GFR calc Af Amer >60 >60 mL/min    Comment: (NOTE) The eGFR has been calculated using the CKD EPI equation. This calculation has not been validated in all clinical situations. eGFR's persistently <60 mL/min signify possible Chronic Kidney Disease.    Anion gap 11 5 - 15  Ethanol     Status: None   Collection Time: 06/04/16  3:45 PM  Result Value Ref Range   Alcohol, Ethyl (B) <5 <5 mg/dL    Comment:        LOWEST DETECTABLE LIMIT FOR SERUM ALCOHOL IS 5 mg/dL FOR MEDICAL PURPOSES ONLY   CBC with Diff     Status: Abnormal   Collection Time: 06/04/16  3:45 PM  Result Value Ref Range   WBC 13.2 (H) 4.0 - 10.5 K/uL   RBC 5.41 4.22 - 5.81 MIL/uL   Hemoglobin 14.4 13.0 - 17.0 g/dL   HCT 42.5 39.0 - 52.0 %   MCV 78.6 78.0 - 100.0 fL   MCH 26.6 26.0 - 34.0 pg   MCHC 33.9 30.0 - 36.0 g/dL   RDW 13.3 11.5 - 15.5 %   Platelets 243 150 - 400 K/uL   Neutrophils Relative % 72 %   Neutro Abs 9.5 (H) 1.7 - 7.7 K/uL   Lymphocytes Relative 21 %   Lymphs Abs 2.7 0.7 - 4.0 K/uL   Monocytes Relative 6 %   Monocytes Absolute 0.7 0.1 - 1.0 K/uL   Eosinophils Relative 1 %   Eosinophils Absolute 0.2 0.0 - 0.7 K/uL   Basophils Relative 0 %   Basophils Absolute 0.1 0.0 - 0.1 K/uL  Lithium level     Status: Abnormal   Collection Time: 06/04/16  3:45 PM  Result Value Ref Range   Lithium Lvl 0.46 (L) 0.60 - 1.20 mmol/L  Urine rapid drug screen (hosp performed)not at [facility]     Status: None   Collection Time: 06/04/16  4:50 PM  Result Value Ref Range   Opiates NONE DETECTED NONE DETECTED   Cocaine NONE DETECTED NONE DETECTED   Benzodiazepines NONE DETECTED NONE DETECTED   Amphetamines NONE DETECTED NONE DETECTED   Tetrahydrocannabinol NONE DETECTED NONE DETECTED   Barbiturates NONE DETECTED NONE DETECTED    Comment:        DRUG SCREEN FOR MEDICAL PURPOSES ONLY.  IF CONFIRMATION IS NEEDED FOR ANY PURPOSE,  NOTIFY LAB WITHIN 5 DAYS.        LOWEST DETECTABLE LIMITS FOR URINE DRUG SCREEN Drug Class       Cutoff (ng/mL) Amphetamine      1000 Barbiturate      200 Benzodiazepine   283 Tricyclics       662  Opiates          300 Cocaine          300 THC              50     Current Facility-Administered Medications  Medication Dose Route Frequency Provider Last Rate Last Dose  . amantadine (SYMMETREL) capsule 100 mg  100 mg Oral BID Gareth Morgan, MD      . clonazePAM Bobbye [place]) tablet 0.5 mg  0.5 mg Oral BID Gareth Morgan, MD   0.5 mg at 06/05/16 1035  . hydrOXYzine (ATARAX/VISTARIL) tablet 25 mg  25 mg Oral Q6H PRN Gareth Morgan, MD      . lithium carbonate capsule 300 mg  300 mg Oral Q lunch Gareth Morgan, MD   300 mg at 06/05/16 1239  . lithium carbonate capsule 600 mg  600 mg Oral BID WC Gareth Morgan, MD      . mirtazapine (REMERON) tablet 7.5 mg  7.5 mg Oral QHS Gareth Morgan, MD      . Oxcarbazepine (TRILEPTAL) tablet 150 mg  150 mg Oral BID Gareth Morgan, MD   150 mg at 06/05/16 1034  . pantoprazole (PROTONIX) EC tablet 40 mg  40 mg Oral Daily Gareth Morgan, MD   40 mg at 06/05/16 1034   Current Outpatient Prescriptions  Medication Sig Dispense Refill  . clonazePAM (KLONOPIN) 0.5 MG tablet Take 1 tablet (0.5 mg total) by mouth 2 (two) times daily. 60 tablet 0  . hydrOXYzine (ATARAX/VISTARIL) 25 MG tablet Take 1 tablet (25 mg total) by mouth every 6 (six) hours as needed for anxiety. 30 tablet 0  . lithium carbonate 300 MG capsule Take 1 capsule (300 mg total) by mouth daily with lunch. 30 capsule 0  . lithium carbonate 600 MG capsule Take 1 capsule (600 mg total) by mouth 2 (two) times daily with a meal. 30 capsule 0  . mirtazapine (REMERON) 7.5 MG tablet Take 1 tablet (7.5 mg total) by mouth at bedtime. 30 tablet 0  . OXcarbazepine (TRILEPTAL) 150 MG tablet Take 1 tablet (150 mg total) by mouth 2 (two) times daily. 60 tablet 0  . pantoprazole (PROTONIX) 40 MG  tablet Take 40 mg by mouth.    Marland Kitchen amantadine (SYMMETREL) 100 MG capsule Take 1 capsule (100 mg total) by mouth 2 (two) times daily. 60 capsule 0    Musculoskeletal: Strength & Muscle Tone: decreased Gait & Station: unable to stand Patient leans: N/A  Psychiatric Specialty Exam: Physical Exam Full physical performed in Emergency Department. I have reviewed this assessment and concur with its findings.   ROS unable to complete secondary to current mental status. Patient seems to be very withdrawn not able to communicate well. Patient has no tremors, shakes or gestures.   Blood pressure (!) 152/110, pulse 65, temperature 97.9 F (36.6 C), resp. rate (!) 24, SpO2 100 %.There is no height or weight on file to calculate BMI.  General Appearance: Bizarre  Eye Contact:  Minimal  Speech:  Blocked and Slurred  Volume:  Decreased  Mood:  Depressed  Affect:  Flat  Thought Process:  Disorganized and Irrelevant  Orientation:  NA  Thought Content:  Illogical and Rumination  Suicidal Thoughts:  No  Homicidal Thoughts:  No  Memory:  NA  Judgement:  Impaired  Insight:  NA  Psychomotor Activity:  Decreased  Concentration:  Concentration: NA and Attention Span: NA  Recall:  NA  Fund of Knowledge:  NA  Language:  Poor  Akathisia:  Negative  Handed:  Right  AIMS (if indicated):     Assets:  Others:  TBD  ADL's:  Impaired  Cognition:  Impaired,  Moderate  Sleep:        Treatment Plan Summary: 26 years old male with the bipolar disorder presented with bizarre behaviors, disorganized thoughts and unable to function at his baseline. Patient continued to be psychiatrically unstable since admitted to the emergency department and meets criteria for acute psychiatric hospitalization for crisis stabilization, safety monitoring and medication management.  Continue psychiatric medication without any changes during this visit Review of lab indicated his lithium level digital 0.46 which is less than  therapeutic and QT/QTC was within normal limits [facility] for appropriate placement needs Appreciate psychiatric consultation and we sign off as of today Please contact [phone] or [phone] if needs further assistance    Disposition: Recommend psychiatric Inpatient admission when medically cleared. Supportive therapy provided about ongoing stressors.  Ambrose Finland, MD 06/05/2016 1:02 PM

## 2016-06-05 NOTE — ED Notes (Signed)
Pt asked to go to bathroom. Unable to walk with staggering and unable to keep eyes open. 2 person assist need to assist with urinal to ensure pt safety.

## 2016-06-06 LAB — CBG MONITORING, ED: Glucose-Capillary: 128 mg/dL — ABNORMAL HIGH (ref 65–99)

## 2016-06-06 MED ORDER — ZIPRASIDONE MESYLATE 20 MG IM SOLR
20.0000 mg | Freq: Once | INTRAMUSCULAR | Status: AC
  Administered 2016-06-06: 20 mg via INTRAMUSCULAR
  Filled 2016-06-06: qty 20

## 2016-06-06 MED ORDER — STERILE WATER FOR INJECTION IJ SOLN
INTRAMUSCULAR | Status: AC
  Filled 2016-06-06: qty 10

## 2016-06-06 NOTE — Progress Notes (Signed)
Pt [facility]. Dr. Estill Cottahomas Cornwall accepting, Call report to [phone].  Patient may transfer at any time.  [facility] notified.  Timmothy EulerJean T. Kaylyn LimSutter, MSW, LCSWA Clinical Social Work Disposition [phone]

## 2016-06-06 NOTE — Progress Notes (Signed)
Pt meets criteria for inpt treatment. CSW faxed referrals to the following inpt facilities for review:  [facility], [facility], [facility], [facility], [facility], [address], [facility], [facility], [facility], [facility], [facility], [facility]   TTS will continue to seek bed placement.  Princess BruinsAquicha Elycia Woodside, LCSWA Clinical Social Work [phone]

## 2016-06-06 NOTE — Progress Notes (Addendum)
ENTERED IN ERROR-INCORRECT PATIENT Per Jacki ConesLaurie, NP@TTS  Office, patient does not meet criteria for inpatient and may be d/c'd.  NP suggests substance use treatment resources, if patient is interested.  CSW will fax [facility].  CSW informed charge nurse, Marylene LandAngela.  Timmothy EulerJean T. Kaylyn LimSutter, MSW, LCSWA Clinical Social Work Disposition [phone]

## 2016-06-06 NOTE — ED Notes (Signed)
Pt got out of bed and ripped off all of his scrubs and was walking around the pod without clothing. Sitter and RN attempted to reorient patient and redirect patient back to room. Security called and assisted patient back to bed. Dr. Elise BenneNanivati called and at bedside. CBG checked as well.

## 2016-06-06 NOTE — ED Notes (Signed)
Pt calm and cooperative and resting at this time.

## 2016-06-06 NOTE — ED Notes (Signed)
Patient able to ambulate independently  

## 2016-06-06 NOTE — ED Notes (Signed)
Patient does not make eye contact, does not respond when asked "is there anything that you need".  Denies pain.

## 2016-06-06 NOTE — ED Notes (Signed)
Pt on phone with dad. Pt reports "he is trying to come around for the best."

## 2016-06-06 NOTE — ED Notes (Signed)
Mom at bedside visiting patient. 

## 2016-06-06 NOTE — ED Notes (Signed)
Mother here for visiting hours when patient being escorted off of property by [employer] for transport [facility].  Mother stating patient "is not acting right".  Mother demanding a print out of MAR.  This RN explained she was unable to do so, but could guide mother to medical records.  Pt's mother continued to insist that patient had been given medications not his prescribed list.  This RN reviewed MAR and informed patient's mother of last dose of documented Geodon and morning meds given.  Mother continues to state we gave her son Geodon without her permission.  This RN apologized for the confusion and patient escorted with Deputy to vehicle.

## 2016-06-06 NOTE — Progress Notes (Signed)
CSW follow up with the following inpt hospitals to check the status of the referral:   [facility]: [facility] declined [facility] per Brandon Fu[facility]. [facility]: left v/m with intake coordinator Brandon Fuentes: left v/m with intake coordinator  [facility]: Brandon Fuentes, does not have referral. Will be re-faxed.  CSW to continue seeking inpt.  Brandon Fuentes, MSW, LCSWA CSW Disposition [phone]

## 2016-06-06 NOTE — ED Notes (Signed)
Informed [employer] of need for transport

## 2016-06-06 NOTE — ED Provider Notes (Signed)
  Physical Exam  BP (!) 146/97 (BP Location: Left Arm)   Pulse 98   Temp 98.7 F (37.1 C) (Oral)   Resp 16   SpO2 96%   Physical Exam  ED Course  Procedures  MDM [facility] by Dr Benjiman Coreornwall       Tarissa Kerin, MD 06/06/16 [phone]

## 2016-06-13 ENCOUNTER — Ambulatory Visit (HOSPITAL_COMMUNITY): Payer: Self-pay | Admitting: Psychiatry

## 2016-07-19 ENCOUNTER — Encounter (HOSPITAL_COMMUNITY): Payer: Self-pay | Admitting: *Deleted

## 2016-07-19 ENCOUNTER — Emergency Department (HOSPITAL_COMMUNITY)
Admission: EM | Admit: 2016-07-19 | Discharge: 2016-07-19 | Disposition: A | Discharge status: Home / Self Care | Payer: 59 | Attending: Emergency Medicine | Admitting: Emergency Medicine

## 2016-07-19 DIAGNOSIS — R112 Nausea with vomiting, unspecified: Secondary | ICD-10-CM | POA: Diagnosis present

## 2016-07-19 DIAGNOSIS — Z79899 Other long term (current) drug therapy: Secondary | ICD-10-CM | POA: Diagnosis not present

## 2016-07-19 DIAGNOSIS — R197 Diarrhea, unspecified: Secondary | ICD-10-CM | POA: Diagnosis not present

## 2016-07-19 MED ORDER — ONDANSETRON HCL 4 MG PO TABS: 4.0000 mg | ORAL_TABLET | Freq: Four times a day (QID) | ORAL | 0 refills | Status: DC

## 2016-07-19 MED ORDER — DICYCLOMINE HCL 10 MG PO CAPS
20.0000 mg | ORAL_CAPSULE | Freq: Once | ORAL | Status: AC
  Administered 2016-07-19: 20 mg via ORAL
  Filled 2016-07-19: qty 2

## 2016-07-19 MED ORDER — DICYCLOMINE HCL 20 MG PO TABS: 20.0000 mg | ORAL_TABLET | Freq: Two times a day (BID) | ORAL | 0 refills | Status: DC

## 2016-07-19 MED ORDER — ONDANSETRON 8 MG PO TBDP
8.0000 mg | ORAL_TABLET | Freq: Once | ORAL | Status: AC
  Administered 2016-07-19: 8 mg via ORAL
  Filled 2016-07-19: qty 1

## 2016-07-19 NOTE — ED Provider Notes (Signed)
[facility]-EMERGENCY DEPT Provider Note   CSN: [id number] Arrival date & time: 07/19/16  0109     History   Chief Complaint Chief Complaint  Patient presents with  . Emesis    HPI Brandon Fuentes is a 26 y.o. male.  Patient with history of bipolar disorder presents with complaint of nausea, vomiting and diarrhea. He denies fever. There is associated abdominal pain which he reports as significant and diffuse. No sick contacts. No hematemesis or bloody stools.    The history is provided by the patient. No language interpreter was used.  Emesis   Associated symptoms include abdominal pain and diarrhea. Pertinent negatives include no chills and no fever.    Past Medical History:  Diagnosis Date  . Bipolar 1 disorder [facility](HCC)     Patient Active Problem List   Diagnosis Date Noted  . Bipolar disorder, curr episode mixed, severe, w/o psychotic features (HCC) 04/10/2016  . 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 Psychosis 04/01/2016    History reviewed. No pertinent surgical history.     Home Medications    Prior to Admission medications   Medication Sig Start Date End Date Taking? Authorizing Provider  amantadine (SYMMETREL) 100 MG capsule Take 1 capsule (100 mg total) by mouth 2 (two) times daily. 04/16/16   Oneta RackLewis, Tanika N, NP  clonazePAM (KLONOPIN) 0.5 MG tablet Take 1 tablet (0.5 mg total) by mouth 2 (two) times daily. 04/16/16   Oneta RackLewis, Tanika N, NP  hydrOXYzine (ATARAX/VISTARIL) 25 MG tablet Take 1 tablet (25 mg total) by mouth every 6 (six) hours as needed for anxiety. 04/16/16   Oneta RackLewis, Tanika N, NP  lithium carbonate 300 MG capsule Take 1 capsule (300 mg total) by mouth daily with lunch. 04/16/16   Oneta RackLewis, Tanika N, NP  lithium carbonate 600 MG capsule Take 1 capsule (600 mg total) by mouth 2 (two) times daily with a meal. 04/16/16   Oneta RackLewis, Tanika N, NP  mirtazapine (REMERON) 7.5 MG tablet Take 1 tablet (7.5 mg total) by mouth at bedtime. 04/16/16   Oneta RackLewis, Tanika N, NP  OXcarbazepine  (TRILEPTAL) 150 MG tablet Take 1 tablet (150 mg total) by mouth 2 (two) times daily. 04/16/16   Oneta RackLewis, Tanika N, NP  pantoprazole (PROTONIX) 40 MG tablet Take 40 mg by mouth.    [provider]    Family History Family History  Problem Relation Age of Onset  . Diabetes Mother     Social History Social History  Substance Use Topics  . Smoking status: Never Smoker  . Smokeless tobacco: Never Used  . Alcohol use No     Allergies   Depakote [divalproex sodium]; Geodon [ziprasidone hcl]; and Zoloft [sertraline hcl]   Review of Systems Review of Systems  Constitutional: Negative for chills and fever.  Respiratory: Negative.   Cardiovascular: Negative.   Gastrointestinal: Positive for abdominal pain, diarrhea, nausea and vomiting. Negative for blood in stool.  Genitourinary: Negative.   Musculoskeletal: Negative.   Skin: Negative.   Neurological: Negative.      Physical Exam Updated Vital Signs BP (!) 147/88 (BP Location: Right Arm)   Pulse 65   Temp 98.4 F (36.9 C) (Oral)   Resp 18   Ht 6\' 1"  (1.854 m)   Wt (!) 142.9 kg (315 lb)   SpO2 98%   BMI 41.56 kg/m   Physical Exam  Constitutional: He is oriented to person, place, and time. He appears well-developed and well-nourished.  Patient sleeping comfortably. Easily awakened.   HENT:  Head: Normocephalic.  Neck: Normal range of motion. Neck supple.  Cardiovascular: Normal rate and regular rhythm.   Pulmonary/Chest: Effort normal and breath sounds normal.  Abdominal: Soft. Bowel sounds are normal. There is no tenderness. There is no rebound and no guarding.  Abdominal exam completely benign.  Musculoskeletal: Normal range of motion.  Neurological: He is alert and oriented to person, place, and time.  Skin: Skin is warm and dry. No rash noted.  Psychiatric: He has a normal mood and affect.     ED Treatments / Results  Labs (all labs ordered are listed, but only abnormal results are displayed) Labs  Reviewed - No data to display  EKG  EKG Interpretation None       Radiology No results found.  Procedures Procedures (including critical care time)  Medications Ordered in ED Medications  ondansetron (ZOFRAN-ODT) disintegrating tablet 8 mg (8 mg Oral Given 07/19/16 0246)  dicyclomine (BENTYL) capsule 20 mg (20 mg Oral Given 07/19/16 0245)     Initial Impression / Assessment and Plan / ED Course  I have reviewed the triage vital signs and the nursing notes.  Pertinent labs & imaging results that were available during my care of the patient were reviewed by me and considered in my medical decision making (see chart for details).     Patient with complaint of N, V, D. He appears very comfortable and has had no vomiting or trips to the bathroom for diarrhea. PO challenge provided and tolerated without complaint of nausea. Abdominal exam benign.   VSS. He is considered stable for discharge home with medication for supportive care. PCP follow up is encouraged.   Final Clinical Impressions(s) / ED Diagnoses   Final diagnoses:  None   1. Nausea, vomiting, diarrhea  New Prescriptions New Prescriptions   No medications on file     Danne [place] 07/26/16 0342    Dione Booze, MD 07/26/16 [phone]

## 2016-07-19 NOTE — ED Triage Notes (Signed)
Patient is alert and oriented x4.  He is complaining of nausea, vomiting and diarrhea that started last night.  Patient states that his belly pain is 10 of 10.  He denies any red are tarry stools

## 2017-04-26 ENCOUNTER — Emergency Department (HOSPITAL_COMMUNITY): Payer: 59

## 2017-04-26 ENCOUNTER — Encounter (HOSPITAL_COMMUNITY): Payer: Self-pay | Admitting: *Deleted

## 2017-04-26 ENCOUNTER — Inpatient Hospital Stay (HOSPITAL_COMMUNITY)
Admission: EM | Admit: 2017-04-26 | Discharge: 2017-05-17 | DRG: 208 | Payer: 59 | Attending: Internal Medicine | Admitting: Internal Medicine

## 2017-04-26 DIAGNOSIS — J982 Interstitial emphysema: Secondary | ICD-10-CM | POA: Diagnosis present

## 2017-04-26 DIAGNOSIS — M549 Dorsalgia, unspecified: Secondary | ICD-10-CM

## 2017-04-26 DIAGNOSIS — K223 Perforation of esophagus: Secondary | ICD-10-CM

## 2017-04-26 DIAGNOSIS — R450 Nervousness: Secondary | ICD-10-CM | POA: Diagnosis not present

## 2017-04-26 DIAGNOSIS — J9690 Respiratory failure, unspecified, unspecified whether with hypoxia or hypercapnia: Secondary | ICD-10-CM

## 2017-04-26 DIAGNOSIS — K567 Ileus, unspecified: Secondary | ICD-10-CM | POA: Diagnosis not present

## 2017-04-26 DIAGNOSIS — D696 Thrombocytopenia, unspecified: Secondary | ICD-10-CM | POA: Diagnosis present

## 2017-04-26 DIAGNOSIS — E870 Hyperosmolality and hypernatremia: Secondary | ICD-10-CM | POA: Diagnosis not present

## 2017-04-26 DIAGNOSIS — R109 Unspecified abdominal pain: Secondary | ICD-10-CM | POA: Diagnosis not present

## 2017-04-26 DIAGNOSIS — Z9119 Patient's noncompliance with other medical treatment and regimen: Secondary | ICD-10-CM | POA: Diagnosis not present

## 2017-04-26 DIAGNOSIS — R410 Disorientation, unspecified: Secondary | ICD-10-CM | POA: Diagnosis present

## 2017-04-26 DIAGNOSIS — I319 Disease of pericardium, unspecified: Secondary | ICD-10-CM | POA: Diagnosis present

## 2017-04-26 DIAGNOSIS — Z9911 Dependence on respirator [ventilator] status: Secondary | ICD-10-CM

## 2017-04-26 DIAGNOSIS — R059 Cough, unspecified: Secondary | ICD-10-CM

## 2017-04-26 DIAGNOSIS — N179 Acute kidney failure, unspecified: Secondary | ICD-10-CM | POA: Diagnosis present

## 2017-04-26 DIAGNOSIS — E876 Hypokalemia: Secondary | ICD-10-CM | POA: Diagnosis not present

## 2017-04-26 DIAGNOSIS — R456 Violent behavior: Secondary | ICD-10-CM | POA: Diagnosis present

## 2017-04-26 DIAGNOSIS — Z811 Family history of alcohol abuse and dependence: Secondary | ICD-10-CM | POA: Diagnosis not present

## 2017-04-26 DIAGNOSIS — G210 Malignant neuroleptic syndrome: Secondary | ICD-10-CM | POA: Diagnosis present

## 2017-04-26 DIAGNOSIS — J9600 Acute respiratory failure, unspecified whether with hypoxia or hypercapnia: Secondary | ICD-10-CM | POA: Diagnosis not present

## 2017-04-26 DIAGNOSIS — R05 Cough: Secondary | ICD-10-CM

## 2017-04-26 DIAGNOSIS — F3113 Bipolar disorder, current episode manic without psychotic features, severe: Secondary | ICD-10-CM | POA: Diagnosis not present

## 2017-04-26 DIAGNOSIS — J9601 Acute respiratory failure with hypoxia: Secondary | ICD-10-CM | POA: Diagnosis present

## 2017-04-26 DIAGNOSIS — E872 Acidosis: Secondary | ICD-10-CM | POA: Diagnosis present

## 2017-04-26 DIAGNOSIS — Z0189 Encounter for other specified special examinations: Secondary | ICD-10-CM

## 2017-04-26 DIAGNOSIS — R945 Abnormal results of liver function studies: Secondary | ICD-10-CM

## 2017-04-26 DIAGNOSIS — F419 Anxiety disorder, unspecified: Secondary | ICD-10-CM | POA: Diagnosis not present

## 2017-04-26 DIAGNOSIS — G9340 Encephalopathy, unspecified: Secondary | ICD-10-CM

## 2017-04-26 DIAGNOSIS — R4182 Altered mental status, unspecified: Secondary | ICD-10-CM

## 2017-04-26 DIAGNOSIS — Z978 Presence of other specified devices: Secondary | ICD-10-CM | POA: Diagnosis not present

## 2017-04-26 DIAGNOSIS — F319 Bipolar disorder, unspecified: Secondary | ICD-10-CM | POA: Diagnosis present

## 2017-04-26 DIAGNOSIS — R451 Restlessness and agitation: Secondary | ICD-10-CM | POA: Diagnosis not present

## 2017-04-26 DIAGNOSIS — R4587 Impulsiveness: Secondary | ICD-10-CM | POA: Diagnosis not present

## 2017-04-26 DIAGNOSIS — R7989 Other specified abnormal findings of blood chemistry: Secondary | ICD-10-CM

## 2017-04-26 DIAGNOSIS — R0781 Pleurodynia: Secondary | ICD-10-CM

## 2017-04-26 DIAGNOSIS — M6282 Rhabdomyolysis: Secondary | ICD-10-CM | POA: Diagnosis not present

## 2017-04-26 DIAGNOSIS — J181 Lobar pneumonia, unspecified organism: Secondary | ICD-10-CM | POA: Diagnosis not present

## 2017-04-26 DIAGNOSIS — Z833 Family history of diabetes mellitus: Secondary | ICD-10-CM | POA: Diagnosis not present

## 2017-04-26 DIAGNOSIS — Z888 Allergy status to other drugs, medicaments and biological substances status: Secondary | ICD-10-CM | POA: Diagnosis not present

## 2017-04-26 LAB — BLOOD GAS, ARTERIAL
ACID-BASE DEFICIT: 3.5 mmol/L — AB (ref 0.0–2.0)
Bicarbonate: 21.6 mmol/L (ref 20.0–28.0)
Drawn by: 422461
FIO2: 100
LHR: 16 {breaths}/min
MECHVT: 600 mL
O2 SAT: 99.4 %
PATIENT TEMPERATURE: 97.7
PCO2 ART: 40.5 mmHg (ref 32.0–48.0)
PEEP/CPAP: 5 cmH2O
PH ART: 7.343 — AB (ref 7.350–7.450)
PO2 ART: 339 mmHg — AB (ref 83.0–108.0)

## 2017-04-26 LAB — I-STAT CHEM 8, ED
BUN: 33 mg/dL — ABNORMAL HIGH (ref 6–20)
Calcium, Ion: 1.15 mmol/L (ref 1.15–1.40)
Chloride: 102 mmol/L (ref 101–111)
Creatinine, Ser: 2.2 mg/dL — ABNORMAL HIGH (ref 0.61–1.24)
Glucose, Bld: 112 mg/dL — ABNORMAL HIGH (ref 65–99)
HEMATOCRIT: 45 % (ref 39.0–52.0)
HEMOGLOBIN: 15.3 g/dL (ref 13.0–17.0)
Potassium: 3.6 mmol/L (ref 3.5–5.1)
SODIUM: 142 mmol/L (ref 135–145)
TCO2: 23 mmol/L (ref 22–32)

## 2017-04-26 LAB — I-STAT TROPONIN, ED: Troponin i, poc: 0.07 ng/mL (ref 0.00–0.08)

## 2017-04-26 LAB — I-STAT CG4 LACTIC ACID, ED: LACTIC ACID, VENOUS: 4.25 mmol/L — AB (ref 0.5–1.9)

## 2017-04-26 LAB — URINALYSIS, ROUTINE W REFLEX MICROSCOPIC
BILIRUBIN URINE: NEGATIVE
Bacteria, UA: NONE SEEN
Glucose, UA: NEGATIVE mg/dL
Ketones, ur: 20 mg/dL — AB
Leukocytes, UA: NEGATIVE
Nitrite: NEGATIVE
PH: 5 (ref 5.0–8.0)
Protein, ur: 100 mg/dL — AB

## 2017-04-26 LAB — CBC
HCT: 41.6 % (ref 39.0–52.0)
Hemoglobin: 14.7 g/dL (ref 13.0–17.0)
MCH: 26.4 pg (ref 26.0–34.0)
MCHC: 35.3 g/dL (ref 30.0–36.0)
MCV: 74.7 fL — ABNORMAL LOW (ref 78.0–100.0)
PLATELETS: 181 10*3/uL (ref 150–400)
RBC: 5.57 MIL/uL (ref 4.22–5.81)
RDW: 13.3 % (ref 11.5–15.5)
WBC: 19.8 10*3/uL — AB (ref 4.0–10.5)

## 2017-04-26 LAB — COMPREHENSIVE METABOLIC PANEL
ALT: 145 U/L — AB (ref 17–63)
AST: 421 U/L — ABNORMAL HIGH (ref 15–41)
Albumin: 5 g/dL (ref 3.5–5.0)
Alkaline Phosphatase: 58 U/L (ref 38–126)
Anion gap: 21 — ABNORMAL HIGH (ref 5–15)
BILIRUBIN TOTAL: 2.6 mg/dL — AB (ref 0.3–1.2)
BUN: 37 mg/dL — AB (ref 6–20)
CO2: 20 mmol/L — ABNORMAL LOW (ref 22–32)
CREATININE: 2.3 mg/dL — AB (ref 0.61–1.24)
Calcium: 9.5 mg/dL (ref 8.9–10.3)
Chloride: 100 mmol/L — ABNORMAL LOW (ref 101–111)
GFR calc non Af Amer: 38 mL/min — ABNORMAL LOW (ref >60)
GFR, EST AFRICAN AMERICAN: 44 mL/min — AB (ref >60)
Glucose, Bld: 117 mg/dL — ABNORMAL HIGH (ref 65–99)
Potassium: 3.7 mmol/L (ref 3.5–5.1)
Sodium: 141 mmol/L (ref 135–145)
TOTAL PROTEIN: 8.3 g/dL — AB (ref 6.5–8.1)

## 2017-04-26 LAB — RAPID URINE DRUG SCREEN, HOSP PERFORMED
Amphetamines: NOT DETECTED
Barbiturates: NOT DETECTED
Benzodiazepines: NOT DETECTED
COCAINE: NOT DETECTED
OPIATES: NOT DETECTED
Tetrahydrocannabinol: NOT DETECTED

## 2017-04-26 LAB — LITHIUM LEVEL: Lithium Lvl: 0.85 mmol/L (ref 0.60–1.20)

## 2017-04-26 LAB — ETHANOL: Alcohol, Ethyl (B): <10 mg/dL (ref <10)

## 2017-04-26 MED ORDER — HEPARIN SODIUM (PORCINE) 5000 UNIT/ML IJ SOLN
5000.0000 [IU] | Freq: Three times a day (TID) | INTRAMUSCULAR | Status: DC
  Administered 2017-04-27 – 2017-05-14 (×41): 5000 [IU] via SUBCUTANEOUS
  Filled 2017-04-26 (×55): qty 1

## 2017-04-26 MED ORDER — IOPAMIDOL (ISOVUE-300) INJECTION 61%
INTRAVENOUS | Status: AC
  Administered 2017-04-26: 80 mL
  Filled 2017-04-26: qty 100

## 2017-04-26 MED ORDER — MIDAZOLAM HCL 2 MG/2ML IJ SOLN
2.0000 mg | INTRAMUSCULAR | Status: DC | PRN
  Administered 2017-04-27: 2 mg via INTRAVENOUS
  Filled 2017-04-26 (×3): qty 2

## 2017-04-26 MED ORDER — FENTANYL CITRATE (PF) 100 MCG/2ML IJ SOLN
100.0000 ug | Freq: Once | INTRAMUSCULAR | Status: AC
  Administered 2017-04-26: 100 ug via INTRAVENOUS

## 2017-04-26 MED ORDER — PANTOPRAZOLE SODIUM 40 MG IV SOLR
40.0000 mg | Freq: Every day | INTRAVENOUS | Status: DC
  Administered 2017-04-27 – 2017-04-28 (×2): 40 mg via INTRAVENOUS
  Filled 2017-04-26 (×2): qty 40

## 2017-04-26 MED ORDER — VANCOMYCIN HCL IN DEXTROSE 1-5 GM/200ML-% IV SOLN
1000.0000 mg | Freq: Once | INTRAVENOUS | Status: AC
  Administered 2017-04-26: 1000 mg via INTRAVENOUS
  Filled 2017-04-26: qty 200

## 2017-04-26 MED ORDER — SODIUM CHLORIDE 0.9 % IV SOLN
0.0000 mg/h | INTRAVENOUS | Status: DC
  Administered 2017-04-26 – 2017-04-27 (×2): 2 mg/h via INTRAVENOUS
  Administered 2017-04-27 – 2017-04-28 (×5): 4 mg/h via INTRAVENOUS
  Administered 2017-04-29: 3 mg/h via INTRAVENOUS
  Administered 2017-04-29 – 2017-04-30 (×2): 4 mg/h via INTRAVENOUS
  Filled 2017-04-26 (×8): qty 10

## 2017-04-26 MED ORDER — MIDAZOLAM BOLUS VIA INFUSION
1.0000 mg | INTRAVENOUS | Status: DC | PRN
  Administered 2017-04-27: 1 mg via INTRAVENOUS
  Administered 2017-04-27 – 2017-04-28 (×3): 2 mg via INTRAVENOUS
  Filled 2017-04-26 (×2): qty 2

## 2017-04-26 MED ORDER — FENTANYL BOLUS VIA INFUSION
50.0000 ug | INTRAVENOUS | Status: DC | PRN
  Administered 2017-04-27 – 2017-04-30 (×6): 50 ug via INTRAVENOUS
  Filled 2017-04-26 (×2): qty 50

## 2017-04-26 MED ORDER — MIDAZOLAM HCL 2 MG/2ML IJ SOLN: 2.0000 mg | INTRAMUSCULAR | Status: DC | PRN

## 2017-04-26 MED ORDER — ETOMIDATE 2 MG/ML IV SOLN
20.0000 mg | Freq: Once | INTRAVENOUS | Status: AC
  Administered 2017-04-26: 40 mg via INTRAVENOUS

## 2017-04-26 MED ORDER — HALOPERIDOL LACTATE 5 MG/ML IJ SOLN
5.0000 mg | Freq: Once | INTRAMUSCULAR | Status: AC
  Administered 2017-04-26: 5 mg via INTRAVENOUS
  Filled 2017-04-26: qty 1

## 2017-04-26 MED ORDER — SODIUM CHLORIDE 0.9 % IV SOLN
25.0000 ug/h | INTRAVENOUS | Status: DC
  Administered 2017-04-26: 50 ug/h via INTRAVENOUS
  Filled 2017-04-26: qty 50

## 2017-04-26 MED ORDER — SODIUM CHLORIDE 0.9 % IV SOLN
250.0000 mL | INTRAVENOUS | Status: DC | PRN
  Administered 2017-05-04 – 2017-05-09 (×2): 250 mL via INTRAVENOUS

## 2017-04-26 MED ORDER — PIPERACILLIN-TAZOBACTAM 3.375 G IVPB 30 MIN
3.3750 g | Freq: Once | INTRAVENOUS | Status: AC
  Administered 2017-04-26: 3.375 g via INTRAVENOUS
  Filled 2017-04-26: qty 50

## 2017-04-26 MED ORDER — DIPHENHYDRAMINE HCL 50 MG/ML IJ SOLN
25.0000 mg | Freq: Once | INTRAMUSCULAR | Status: AC
  Administered 2017-04-26: 25 mg via INTRAVENOUS
  Filled 2017-04-26: qty 1

## 2017-04-26 MED ORDER — SODIUM CHLORIDE 0.9 % IV BOLUS
1000.0000 mL | Freq: Once | INTRAVENOUS | Status: AC
  Administered 2017-04-26: 1000 mL via INTRAVENOUS

## 2017-04-26 MED ORDER — PROPOFOL 1000 MG/100ML IV EMUL
5.0000 ug/kg/min | Freq: Once | INTRAVENOUS | Status: AC
  Administered 2017-04-26: 20 ug/kg/min via INTRAVENOUS

## 2017-04-26 MED ORDER — PROPOFOL 1000 MG/100ML IV EMUL
INTRAVENOUS | Status: AC
  Administered 2017-04-26: 20 ug/kg/min via INTRAVENOUS
  Filled 2017-04-26: qty 100

## 2017-04-26 MED ORDER — MIDAZOLAM HCL 5 MG/5ML IJ SOLN
4.0000 mg | Freq: Once | INTRAMUSCULAR | Status: AC
  Administered 2017-04-26: 4 mg via INTRAVENOUS

## 2017-04-26 MED ORDER — LORAZEPAM 2 MG/ML IJ SOLN
2.0000 mg | Freq: Once | INTRAMUSCULAR | Status: AC
  Administered 2017-04-26: 2 mg via INTRAVENOUS
  Filled 2017-04-26: qty 1

## 2017-04-26 MED ORDER — SUCCINYLCHOLINE CHLORIDE 20 MG/ML IJ SOLN
120.0000 mg | Freq: Once | INTRAMUSCULAR | Status: AC
  Administered 2017-04-26: 120 mg via INTRAVENOUS
  Filled 2017-04-26: qty 6

## 2017-04-26 MED ORDER — SODIUM CHLORIDE 0.9 % IJ SOLN
INTRAMUSCULAR | Status: AC
  Administered 2017-04-26: 9 mL
  Filled 2017-04-26: qty 50

## 2017-04-26 MED ORDER — FENTANYL CITRATE (PF) 100 MCG/2ML IJ SOLN
50.0000 ug | Freq: Once | INTRAMUSCULAR | Status: AC
  Administered 2017-04-26: 50 ug via INTRAVENOUS
  Filled 2017-04-26: qty 2

## 2017-04-26 NOTE — ED Notes (Signed)
RN AND MD NOTIFIED OF PATIENT'S LACTIC ACID LEVEL OF 4.25

## 2017-04-26 NOTE — ED Triage Notes (Signed)
Per EMS: Pt's lithium level's were reportedly too high, so they took the pt off lithium suddenly and now he has not had lithium in 3-4 days.  Pt is screaming and cussing.  Pt reportedly  Peed and defecated on himself and [facility].  Pt is no answering questions appropriately.

## 2017-04-26 NOTE — ED Notes (Signed)
[facility] called for transport. 

## 2017-04-26 NOTE — Progress Notes (Signed)
Pt transported to CT scan.  Pt waking up and combative during CT scan, RN got more orders from MD.  Pt remained stable and sedated for rest of trip.  Pt returned to [facility].

## 2017-04-26 NOTE — ED Notes (Signed)
1908: Timeout 1909:40 of etomidate given 1909: 120 succs given 1910: Physician using glidoscope for intubation 1912: tube in for intubation

## 2017-04-26 NOTE — ED Notes (Signed)
Bed: [id number] Expected date:  Expected time:  Means of arrival:  Comments: 27 yo AMS- in custody from jail

## 2017-04-26 NOTE — H&P (Addendum)
.. ..  Name: Brandon Fuentes MRN: [id number] DOB: [birth year]    ADMISSION DATE:  04/26/2017 CONSULTATION DATE:  04/26/17  REFERRING MD :  ED PHYSICIAN- [facility]  CHIEF COMPLAINT:  Agitation and combative  BRIEF PATIENT DESCRIPTION: 27 yr old Bipolar pt intubated found to have pneumomediastinum and pneumopericardium PCCM consulted for admission.  SIGNIFICANT EVENTS  intubated  STUDIES:  Labs:AGMA, lactic acid 4.25, UDS negative, BUN/Cr 33/2.2 (elevated from baseline)  CT official read: Extensive pneumomediastinum and pneumopericardium is noted. Extensive soft tissue gas is seen in the supraclavicular and cervical soft tissues.  This is concerning for rupture of bronchus or esophagus.  Endotracheal tube is directed into right mainstem bronchus; withdrawal by 3-4 cm is recommended.    HISTORY OF PRESENT ILLNESS:  (Pt intubated, history acquired from other care providers including RN and Triage notes)  27 yr old male with Bipolar Type 1 disorder [facility] with a recent history of high lithium levels. The medication was discontinued and he has now received no therapy for the last 3-4 days. Combative at the jail brought in for medical clearance. In the ER the pt was combative, screaming, cursing and not answering questions appropiately.  He received  Imaging showed a Pneumomediastinum with Pneumopericardium and ETT in the Right mainstem bronchus. PCCM consulted for admission.  Upon my evaluation of the chart, objective information and the patient, a consult was placed to CTS due to the fact that no clear etiology for the pneumomediastinum.  CTS recommended transfer to [facility] for management.   PAST MEDICAL HISTORY :   has a past medical history of Bipolar 1 disorder (HCC).  has no past surgical history on file. Prior to Admission medications   Medication Sig Start Date End Date Taking? Authorizing Provider  ARIPiprazole (ABILIFY) 20 MG tablet Take 20 mg by mouth at bedtime.   Yes  [provider]  divalproex (DEPAKOTE) 500 MG DR tablet Take 500 mg by mouth 2 (two) times daily.   Yes [provider]  hydrOXYzine (VISTARIL) 100 MG capsule Take 100 mg by mouth 2 (two) times daily.   Yes [provider]  amantadine (SYMMETREL) 100 MG capsule Take 1 capsule (100 mg total) by mouth 2 (two) times daily. Patient not taking: Reported on 04/26/2017 04/16/16   Oneta Rack, NP  clonazePAM (KLONOPIN) 0.5 MG tablet Take 1 tablet (0.5 mg total) by mouth 2 (two) times daily. Patient not taking: Reported on 04/26/2017 04/16/16   Oneta Rack, NP  dicyclomine (BENTYL) 20 MG tablet Take 1 tablet (20 mg total) by mouth 2 (two) times daily. Patient not taking: Reported on 04/26/2017 07/19/16   Elpidio Anis, PA-C  hydrOXYzine (ATARAX/VISTARIL) 25 MG tablet Take 1 tablet (25 mg total) by mouth every 6 (six) hours as needed for anxiety. Patient not taking: Reported on 04/26/2017 04/16/16   Oneta Rack, NP  lithium carbonate 300 MG capsule Take 1 capsule (300 mg total) by mouth daily with lunch. Patient not taking: Reported on 04/26/2017 04/16/16   Oneta Rack, NP  lithium carbonate 600 MG capsule Take 1 capsule (600 mg total) by mouth 2 (two) times daily with a meal. Patient not taking: Reported on 04/26/2017 04/16/16   Oneta Rack, NP  mirtazapine (REMERON) 7.5 MG tablet Take 1 tablet (7.5 mg total) by mouth at bedtime. Patient not taking: Reported on 04/26/2017 04/16/16   Oneta Rack, NP  ondansetron (ZOFRAN) 4 MG tablet Take 1 tablet (4 mg total)  by mouth every 6 (six) hours. Patient not taking: Reported on 04/26/2017 07/19/16   Elpidio Anis, PA-C  OXcarbazepine (TRILEPTAL) 150 MG tablet Take 1 tablet (150 mg total) by mouth 2 (two) times daily. Patient not taking: Reported on 04/26/2017 04/16/16   Oneta Rack, NP   Allergies  Allergen Reactions  . Depakote [Divalproex Sodium] Other (See Comments)    Sleepiness, anger  . Geodon [Ziprasidone Hcl]  Other (See Comments)    Per mom pt get very agitated  . Zoloft [Sertraline Hcl]     anger    FAMILY HISTORY:  family history includes Diabetes in his mother. SOCIAL HISTORY:  reports that he has never smoked. He has never used smokeless tobacco. He reports that he does not drink alcohol or use drugs.  REVIEW OF SYSTEMS:  (Unable to obtain due to the fact that pt is encephalopathic intubated on sedation significant psych history) Constitutional: Negative for fever, chills, weight loss, malaise/fatigue and diaphoresis.  HENT: Negative for hearing loss, ear pain, nosebleeds, congestion, sore throat, neck pain, tinnitus and ear discharge.   Eyes: Negative for blurred vision, double vision, photophobia, pain, discharge and redness.  Respiratory: Negative for cough, hemoptysis, sputum production, shortness of breath, wheezing and stridor.   Cardiovascular: Negative for chest pain, palpitations, orthopnea, claudication, leg swelling and PND.  Gastrointestinal: Negative for heartburn, nausea, vomiting, abdominal pain, diarrhea, constipation, blood in stool and melena.  Genitourinary: Negative for dysuria, urgency, frequency, hematuria and flank pain.  Musculoskeletal: Negative for myalgias, back pain, joint pain and falls.  Skin: Negative for itching and rash.  Neurological: Negative for dizziness, tingling, tremors, sensory change, speech change, focal weakness, seizures, loss of consciousness, weakness and headaches.  Endo/Heme/Allergies: Negative for environmental allergies and polydipsia. Does not bruise/bleed easily.  SUBJECTIVE:   VITAL SIGNS: Temp:  [97.5 F (36.4 C)-98.9 F (37.2 C)] 97.7 F (36.5 C) (03/29 2315) Pulse Rate:  [83-112] 88 (03/29 2315) Resp:  [16-22] 17 (03/29 2315) BP: (114-147)/(65-106) 117/71 (03/29 2315) SpO2:  [96 %-100 %] 100 % (03/29 2315) FiO2 (%):  [40 %] 40 % (03/29 2241) Weight:  [100 kg (220 lb 7.4 oz)] 100 kg (220 lb 7.4 oz) (03/29 2029)  PHYSICAL  EXAMINATION: General:  intubated Neuro:  No focal deficit, sedated HEENT:  ETT in ororpharynx, periorbital edema on the right eye, crepitus primarily over the right side of the neck Cardiovascular:  S1 and S2 appreciated Lungs:  Clear to auscultation bilaterally Abdomen:  Soft non distended + BS Musculoskeletal:  No gross deformites Skin:  No skin breakdown noted  Recent Labs  Lab 04/26/17 1527 04/26/17 1607  NA 141 142  K 3.7 3.6  CL 100* 102  CO2 20*  --   BUN 37* 33*  CREATININE 2.30* 2.20*  GLUCOSE 117* 112*   Recent Labs  Lab 04/26/17 1527 04/26/17 1607  HGB 14.7 15.3  HCT 41.6 45.0  WBC 19.8*  --   PLT 181  --    Ct Head Wo Contrast  Result Date: 04/26/2017 CLINICAL DATA:  Altered mental status EXAM: CT HEAD WITHOUT CONTRAST TECHNIQUE: Contiguous axial images were obtained from the base of the skull through the vertex without intravenous contrast. COMPARISON:  None. FINDINGS: Brain: No mass lesion, intraparenchymal hemorrhage or extra-axial collection. No evidence of acute cortical infarct. Normal appearance of the brain parenchyma and extra axial spaces for age. Vascular: No hyperdense vessel or unexpected vascular calcification. 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  Normal calvarium. Sinuses/Orbits:  No sinus fluid levels or advanced mucosal thickening. No mastoid effusion. Normal orbits. IMPRESSION: Normal brain. Electronically Signed   By: Deatra Robinson M.D.   On: 04/26/2017 21:02   Ct Soft Tissue Neck W Contrast  Result Date: 04/26/2017 CLINICAL DATA:  Vomiting around endotracheal tube. EXAM: CT NECK WITH CONTRAST TECHNIQUE: Multidetector CT imaging of the neck was performed using the standard protocol following the bolus administration of intravenous contrast. CONTRAST:  80mL ISOVUE-300 IOPAMIDOL (ISOVUE-300) INJECTION 61% COMPARISON:  None. FINDINGS: PHARYNX AND LARYNX: The endotracheal tube tip is in the proximal right mainstem bronchus. Nasopharyngeal  opacification is common in the setting of endotracheal intubation. Nasogastric tube courses below the field of view. No focal laryngeal or pharyngeal abnormality otherwise. SALIVARY GLANDS: --Parotid: No mass lesion or inflammation. No sialolithiasis or ductal dilatation. Multifocal free air in the carotid spaces. --Submandibular: Symmetric without inflammation. No sialolithiasis or ductal dilatation. Multifocal free air in both submandibular spaces. --Sublingual: Normal. No ranula or other visible lesion of the base of tongue and floor of mouth. THYROID: Normal. LYMPH NODES: No enlarged or abnormal density lymph nodes. VASCULAR: Major cervical vessels are patent. LIMITED INTRACRANIAL: Normal. VISUALIZED ORBITS: Normal. MASTOIDS AND VISUALIZED PARANASAL SINUSES: No fluid levels or advanced mucosal thickening. No mastoid effusion. SKELETON: No bony spinal canal stenosis. No lytic or blastic lesions. UPPER CHEST: There is a small focal defect of the posterior trachea just below the thoracic inlet (series 4, image 102, 105). OTHER: There is extensive emphysema throughout the deep spaces of the neck. IMPRESSION: 1. Small defect of the posterior aspect of the trachea just below the level of the thoracic inlet may indicate a small tracheal perforation, which is a potential source for the extensive pneumomediastinum and emphysema of the deep spaces of the neck. If there is no history of trauma, however, an esophageal perforation is probably still more likely, since the pneumomediastinum was present prior to intubation. 2. Endotracheal tube tip is in the proximal right mainstem bronchus. Electronically Signed   By: Deatra Robinson M.D.   On: 04/26/2017 20:59   Ct Chest W Contrast  Result Date: 04/26/2017 CLINICAL DATA:  Generalized abdominal pain, elevated liver function tests. Pneumomediastinum. EXAM: CT CHEST, ABDOMEN, AND PELVIS WITH CONTRAST TECHNIQUE: Multidetector CT imaging of the chest, abdomen and pelvis was  performed following the standard protocol during bolus administration of intravenous contrast. However, arms could not be brought above the patient's head, resulting in artifact. CONTRAST:  80mL ISOVUE-300 IOPAMIDOL (ISOVUE-300) INJECTION 61% COMPARISON:  Radiograph of same day. FINDINGS: CT CHEST FINDINGS Cardiovascular: No significant vascular findings. Normal heart size. No pericardial effusion. Mediastinum/Nodes: Endotracheal tube seen directed into right mainstem bronchus. Nasogastric tube is seen going down the esophagus, which is looped in the stomach and distal tip is seen extending back to proximal esophagus. Extensive pneumomediastinum is noted as well as pneumopericardium. Extensive soft tissue gas is seen in the supraclavicular region and cervical region. Lungs/Pleura: No pneumothorax or pleural effusion is noted. Lungs are clear. Musculoskeletal: No chest wall mass or suspicious bone lesions identified. CT ABDOMEN PELVIS FINDINGS Hepatobiliary: No focal liver abnormality is seen. No gallstones, gallbladder wall thickening, or biliary dilatation. Pancreas: Unremarkable. No pancreatic ductal dilatation or surrounding inflammatory changes. Spleen: Normal in size without focal abnormality. Adrenals/Urinary Tract: Adrenal glands are unremarkable. Kidneys are normal, without renal calculi, focal lesion, or hydronephrosis. Bladder is unremarkable. Stomach/Bowel: Stomach is within normal limits. Appendix appears normal. No evidence of bowel wall thickening, distention, or inflammatory changes. Vascular/Lymphatic: No significant  vascular findings are present. No enlarged abdominal or pelvic lymph nodes. Reproductive: Prostate is unremarkable. Other: No abdominal wall hernia or abnormality. No abdominopelvic ascites. Musculoskeletal: No acute or significant osseous findings. IMPRESSION: Extensive pneumomediastinum and pneumopericardium is noted. Extensive soft tissue gas is seen in the supraclavicular and  cervical soft tissues. This is concerning for rupture of bronchus or esophagus. Endotracheal tube is directed into right mainstem bronchus; withdrawal by 3-4 cm is recommended. Critical Value/emergent results were called by telephone at the time of interpretation on 04/26/2017 at 9:03 pm to Dr. Chaney Malling , who verbally acknowledged these results. Nasogastric tube is seen looped within the stomach, with distal tip directed back into the esophagus and positioned within proximal esophagus. No definite abnormality seen in the abdomen or pelvis. Electronically Signed   By: Lupita Raider, M.D.   On: 04/26/2017 21:05   Ct Abdomen Pelvis W Contrast  Result Date: 04/26/2017 CLINICAL DATA:  Generalized abdominal pain, elevated liver function tests. Pneumomediastinum. EXAM: CT CHEST, ABDOMEN, AND PELVIS WITH CONTRAST TECHNIQUE: Multidetector CT imaging of the chest, abdomen and pelvis was performed following the standard protocol during bolus administration of intravenous contrast. However, arms could not be brought above the patient's head, resulting in artifact. CONTRAST:  80mL ISOVUE-300 IOPAMIDOL (ISOVUE-300) INJECTION 61% COMPARISON:  Radiograph of same day. FINDINGS: CT CHEST FINDINGS Cardiovascular: No significant vascular findings. Normal heart size. No pericardial effusion. Mediastinum/Nodes: Endotracheal tube seen directed into right mainstem bronchus. Nasogastric tube is seen going down the esophagus, which is looped in the stomach and distal tip is seen extending back to proximal esophagus. Extensive pneumomediastinum is noted as well as pneumopericardium. Extensive soft tissue gas is seen in the supraclavicular region and cervical region. Lungs/Pleura: No pneumothorax or pleural effusion is noted. Lungs are clear. Musculoskeletal: No chest wall mass or suspicious bone lesions identified. CT ABDOMEN PELVIS FINDINGS Hepatobiliary: No focal liver abnormality is seen. No gallstones, gallbladder wall thickening,  or biliary dilatation. Pancreas: Unremarkable. No pancreatic ductal dilatation or surrounding inflammatory changes. Spleen: Normal in size without focal abnormality. Adrenals/Urinary Tract: Adrenal glands are unremarkable. Kidneys are normal, without renal calculi, focal lesion, or hydronephrosis. Bladder is unremarkable. Stomach/Bowel: Stomach is within normal limits. Appendix appears normal. No evidence of bowel wall thickening, distention, or inflammatory changes. Vascular/Lymphatic: No significant vascular findings are present. No enlarged abdominal or pelvic lymph nodes. Reproductive: Prostate is unremarkable. Other: No abdominal wall hernia or abnormality. No abdominopelvic ascites. Musculoskeletal: No acute or significant osseous findings. IMPRESSION: Extensive pneumomediastinum and pneumopericardium is noted. Extensive soft tissue gas is seen in the supraclavicular and cervical soft tissues. This is concerning for rupture of bronchus or esophagus. Endotracheal tube is directed into right mainstem bronchus; withdrawal by 3-4 cm is recommended. Critical Value/emergent results were called by telephone at the time of interpretation on 04/26/2017 at 9:03 pm to Dr. Chaney Malling , who verbally acknowledged these results. Nasogastric tube is seen looped within the stomach, with distal tip directed back into the esophagus and positioned within proximal esophagus. No definite abnormality seen in the abdomen or pelvis. Electronically Signed   By: Lupita Raider, M.D.   On: 04/26/2017 21:05   Dg Chest Port 1 View  Result Date: 04/26/2017 CLINICAL DATA:  ETT placement EXAM: PORTABLE CHEST 1 VIEW COMPARISON:  CT 04/26/2017, chest x-ray 04/26/2017 FINDINGS: Endotracheal tube tip is about 2.4 cm superior to the carina. Esophageal tube has been removed. Pneumopericardium and pneumomediastinum with subcutaneous emphysema in the neck. No consolidation or effusion.  Stable cardiomediastinal silhouette IMPRESSION: 1.  Endotracheal tube tip is about 2.4 cm superior to the carina 2. Removal of esophageal tube 3. Pneumopericardium and pneumomediastinum with large amount of soft tissue emphysema at the neck Electronically Signed   By: Jasmine Pang M.D.   On: 04/26/2017 21:19   Dg Chest Port 1 View  Result Date: 04/26/2017 CLINICAL DATA:  ETT placement EXAM: PORTABLE CHEST 1 VIEW COMPARISON:  04/26/2017 FINDINGS: Endotracheal tube tip is about 1 cm superior to the carina. Esophageal tube appears looped back upon itself, side-port overlies the upper esophagus. No consolidation or pleural effusion. Pneumomediastinum with subcutaneous emphysema in the supraclavicular fossa. No discrete pneumothorax is seen. IMPRESSION: 1. Endotracheal tube tip about 1 cm superior to carina 2. Esophageal tube is looped upon itself and the side port appears to be positioned over the upper mediastinum/esophagus. Repositioning recommended 3. Pneumomediastinum with moderate subcutaneous emphysema in the neck and supraclavicular fossa as before Electronically Signed   By: Jasmine Pang M.D.   On: 04/26/2017 19:47   Dg Chest Port 1 View  Result Date: 04/26/2017 CLINICAL DATA:  Leukocytosis EXAM: PORTABLE CHEST 1 VIEW COMPARISON:  None. FINDINGS: Pneumomediastinum with soft tissue emphysema extending into the neck. No pneumothorax. No pleural effusion or focal consolidation. Normal cardiomediastinal silhouette. No acute osseous abnormality. IMPRESSION: Pneumomediastinum and soft tissue emphysema extending into the neck soft tissues. Differential considerations include perforated esophagus versus tracheobronchial injury versus penetrating injury. Critical Value/emergent results were called by telephone at the time of interpretation on 04/26/2017 at 6:39pm to Dr. Chaney Malling , who verbally acknowledged these results. Electronically Signed   By: Elige Ko   On: 04/26/2017 18:41   US Abdomen Limited Ruq  Result Date: 04/26/2017 CLINICAL DATA:  Increased  LFTs EXAM: ULTRASOUND ABDOMEN LIMITED RIGHT UPPER QUADRANT COMPARISON:  None. FINDINGS: Gallbladder: No gallstones or wall thickening visualized. No sonographic Murphy sign noted by sonographer. Common bile duct: Diameter: 4.5 mm Liver: No focal lesion identified. Increased hepatic parenchymal echogenicity. Portal vein is patent on color Doppler imaging with normal direction of blood flow towards the liver. IMPRESSION: 1. No cholelithiasis or sonographic evidence of acute cholecystitis. 2. Increased hepatic echogenicity as can be seen with hepatic steatosis. Electronically Signed   By: Elige Ko   On: 04/26/2017 19:17    ASSESSMENT / PLAN: NEURO: Altered mental status - h/o Bipolar Type 1 Disease Significant Agitation and combative behavior  received a significant amount of sedation H/o supratherapeutic levels of Lithium then cessation of medication Checked Lithium levels on presentation 0.85 Pt remains intubated and sedated   CARDIAC: Pneumopericardium Not currently on Vasopressors MAP goal>71mmHg Consult placed to CTS- recommended transfer then esophogram in AM Elevated Lactic noted Monitor QTc (baseline 463)-> 530 now on presentation Avoid Qt prolonging meds   PULMONARY: Intubated on PRVC CT shows ETT in Right main- pull back by 3-4cm Pneumomediastinum- etiology unknown Will r/o esophageal injury (tear/rupture)- Esophogram in am CTS following PEEP 0 to limit worsening of pneumomediastinum with excessive positive pressure.  O2 sat >92% acceptable  ABG: 7.343/40.5/339/21- decrease FiO2 to 40%  ID: Leukocytosis noted No fevers no hemodynamic instability Given Vancomycin and Zosyn in the ED empirically Discontinue Vancomycin given AKI  Send PCT- and deescalate if negative F/u cx that were sent Trend WBC, fever curve, lactate  Endocrine: Check TSH No H/o insulin dependence  GI: R/O Esophageal injury Elevated Transaminases Esophogram in AM Keep pt NPO Started on  PPI  Heme: No active bleeding or coagulopathy Started on  DVT PPX  ( SCDs and Heparin)  RENAL Anion Gap Metabolic Acidosis Lactate noted- 4.25 at 3 pm Will Repeat now  Baseline Cr at 1.22 now 2.3 H/o elevated Lithium levels 3-4 days Now subtherapeutic when level checked on presentation Serum sodium within normal limits Given AKI still would like to start on IVF and check Uosm and UNa- rule out renal toxicity from Lithium Indwelling foley catheter- strict Is and Os  .Marland Kitchen. Lab Results  Component Value Date   CREATININE 2.20 (H) 04/26/2017   CREATININE 2.30 (H) 04/26/2017   CREATININE 1.22 06/04/2016  electrolyte goals K+ 4, Mg 2 Phos 2  .    I, Dr Newell CoralKristen Dariusz Brase have personally reviewed patient's available data, including medical history, events of note, physical examination and test results as part of my evaluation. I have discussed with NP Janyth ContesEubanks and other care providers such as pharmacist, RN and Elink. The patient is critically ill with multiple organ systems failure and requires high complexity decision making for assessment and support, frequent evaluation and titration of therapies, application of advanced monitoring technologies and extensive interpretation of multiple databases. Critical Care Time devoted to patient care services described in this note is 55 Minutes. This time reflects time of care of this signee Dr Newell CoralKristen Sherilyn Windhorst. This critical care time does not reflect procedure time, or teaching time or supervisory time of NP but could involve care discussion time    [facility] TIME: 55 mins CODE STATUS: Full PROGNOSIS: Guarded FAMILY: not at bedside. [facility]   Signed Dr Newell CoralKristen Kynsley [facility]  04/26/2017, 11:43 PM

## 2017-04-27 ENCOUNTER — Inpatient Hospital Stay (HOSPITAL_COMMUNITY): Payer: 59

## 2017-04-27 DIAGNOSIS — J9600 Acute respiratory failure, unspecified whether with hypoxia or hypercapnia: Secondary | ICD-10-CM

## 2017-04-27 DIAGNOSIS — J982 Interstitial emphysema: Secondary | ICD-10-CM

## 2017-04-27 DIAGNOSIS — Z978 Presence of other specified devices: Secondary | ICD-10-CM

## 2017-04-27 DIAGNOSIS — R7989 Other specified abnormal findings of blood chemistry: Secondary | ICD-10-CM

## 2017-04-27 DIAGNOSIS — R945 Abnormal results of liver function studies: Secondary | ICD-10-CM

## 2017-04-27 DIAGNOSIS — I319 Disease of pericardium, unspecified: Secondary | ICD-10-CM

## 2017-04-27 LAB — CBC
HCT: 36.9 % — ABNORMAL LOW (ref 39.0–52.0)
HEMOGLOBIN: 12.6 g/dL — AB (ref 13.0–17.0)
MCH: 25.7 pg — ABNORMAL LOW (ref 26.0–34.0)
MCHC: 34.1 g/dL (ref 30.0–36.0)
MCV: 75.2 fL — AB (ref 78.0–100.0)
Platelets: 149 10*3/uL — ABNORMAL LOW (ref 150–400)
RBC: 4.91 MIL/uL (ref 4.22–5.81)
RDW: 13.6 % (ref 11.5–15.5)
WBC: 16.8 10*3/uL — ABNORMAL HIGH (ref 4.0–10.5)

## 2017-04-27 LAB — HIV ANTIBODY (ROUTINE TESTING W REFLEX): HIV Screen 4th Generation wRfx: NONREACTIVE

## 2017-04-27 LAB — BASIC METABOLIC PANEL
ANION GAP: 14 (ref 5–15)
BUN: 23 mg/dL — ABNORMAL HIGH (ref 6–20)
CHLORIDE: 106 mmol/L (ref 101–111)
CO2: 21 mmol/L — AB (ref 22–32)
Calcium: 8.6 mg/dL — ABNORMAL LOW (ref 8.9–10.3)
Creatinine, Ser: 1.59 mg/dL — ABNORMAL HIGH (ref 0.61–1.24)
GFR calc non Af Amer: 59 mL/min — ABNORMAL LOW (ref >60)
Glucose, Bld: 116 mg/dL — ABNORMAL HIGH (ref 65–99)
Potassium: 3.5 mmol/L (ref 3.5–5.1)
Sodium: 141 mmol/L (ref 135–145)

## 2017-04-27 LAB — LACTIC ACID, PLASMA
LACTIC ACID, VENOUS: 1.2 mmol/L (ref 0.5–1.9)
LACTIC ACID, VENOUS: 1.5 mmol/L (ref 0.5–1.9)

## 2017-04-27 LAB — HEPATITIS PANEL, ACUTE
Hep A IgM: NEGATIVE
Hep B C IgM: NEGATIVE
Hepatitis B Surface Ag: NEGATIVE

## 2017-04-27 LAB — MRSA PCR SCREENING: MRSA by PCR: POSITIVE — AB

## 2017-04-27 LAB — SODIUM, URINE, RANDOM: Sodium, Ur: <10 mmol/L

## 2017-04-27 LAB — PROCALCITONIN: Procalcitonin: <0.1 ng/mL

## 2017-04-27 LAB — MAGNESIUM: Magnesium: 2.1 mg/dL (ref 1.7–2.4)

## 2017-04-27 LAB — OSMOLALITY, URINE: OSMOLALITY UR: 650 mosm/kg (ref 300–900)

## 2017-04-27 LAB — PHOSPHORUS: Phosphorus: 4.7 mg/dL — ABNORMAL HIGH (ref 2.5–4.6)

## 2017-04-27 MED ORDER — CHLORHEXIDINE GLUCONATE 0.12% ORAL RINSE (MEDLINE KIT)
15.0000 mL | Freq: Two times a day (BID) | OROMUCOSAL | Status: DC
  Administered 2017-04-27 (×2): 15 mL via OROMUCOSAL

## 2017-04-27 MED ORDER — IOPAMIDOL (ISOVUE-300) INJECTION 61%
INTRAVENOUS | Status: AC
  Administered 2017-04-27: 75 mL
  Filled 2017-04-27: qty 100

## 2017-04-27 MED ORDER — ORAL CARE MOUTH RINSE
15.0000 mL | OROMUCOSAL | Status: DC
  Administered 2017-04-27 – 2017-04-28 (×15): 15 mL via OROMUCOSAL

## 2017-04-27 MED ORDER — PIPERACILLIN-TAZOBACTAM 3.375 G IVPB
3.3750 g | Freq: Three times a day (TID) | INTRAVENOUS | Status: AC
  Administered 2017-04-27 – 2017-05-02 (×18): 3.375 g via INTRAVENOUS
  Filled 2017-04-27 (×18): qty 50

## 2017-04-27 MED ORDER — SODIUM CHLORIDE 0.9 % IV SOLN
INTRAVENOUS | Status: DC
  Administered 2017-04-27 – 2017-04-28 (×3): via INTRAVENOUS
  Administered 2017-04-28: 100 mL/h via INTRAVENOUS
  Administered 2017-04-29 – 2017-04-30 (×2): via INTRAVENOUS

## 2017-04-27 MED ORDER — FENTANYL 2500MCG IN NS 250ML (10MCG/ML) PREMIX INFUSION
25.0000 ug/h | INTRAVENOUS | Status: DC
  Administered 2017-04-27 – 2017-04-30 (×10): 300 ug/h via INTRAVENOUS
  Filled 2017-04-27 (×10): qty 250

## 2017-04-27 MED ORDER — MUPIROCIN 2 % EX OINT
1.0000 "application " | TOPICAL_OINTMENT | Freq: Two times a day (BID) | CUTANEOUS | Status: AC
  Administered 2017-04-27 – 2017-05-01 (×10): 1 via NASAL
  Filled 2017-04-27: qty 22

## 2017-04-27 MED ORDER — CHLORHEXIDINE GLUCONATE CLOTH 2 % EX PADS
6.0000 | MEDICATED_PAD | Freq: Every day | CUTANEOUS | Status: AC
  Administered 2017-04-28 – 2017-05-01 (×4): 6 via TOPICAL

## 2017-04-27 NOTE — Progress Notes (Signed)
Pharmacy Antibiotic Note  Brandon AuerSherwood Fuentes is a 27 y.o. male admitted on 04/26/2017 with altered mental status.  Pharmacy has been consulted for Zosyn dosing. WBC is elevated. Noted renal dysfunction. Pneumomediastinum or CXR with possible ruptured esophagus.   Plan: Zosyn 3.375G IV q8h to be infused over 4 hours Trend WBC, temp, renal function    Weight: 220 lb 7.4 oz (100 kg)  Temp (24hrs), Avg:97.9 F (36.6 C), Min:97.5 F (36.4 C), Max:98.9 F (37.2 C)  Recent Labs  Lab 04/26/17 1527 04/26/17 1535 04/26/17 1607 04/27/17 0227  WBC 19.8*  --   --  16.8*  CREATININE 2.30*  --  2.20*  --   LATICACIDVEN  --  4.25*  --   --     CrCl cannot be calculated (Unknown ideal weight.).    Allergies  Allergen Reactions  . Depakote [Divalproex Sodium] Other (See Comments)    Sleepiness, anger  . Geodon [Ziprasidone Hcl] Other (See Comments)    Per mom pt get very agitated  . Zoloft [Sertraline Hcl]     anger    s Abran DukeLedford, [employer] 04/27/2017 3:54 AM

## 2017-04-27 NOTE — ED Provider Notes (Signed)
[facility]   CSN: [id number] Arrival date & time: 04/26/17  1500     History   Chief Complaint Chief Complaint  Patient presents with  . Altered Mental Status  . Medical Clearance    HPI Lise AuerSherwood Shenk is a 27 y.o. male.  HPI Patient presents to the emergency department with altered mental started yesterday.  The patient was stopped on his lithium 4 days ago.  Is unable to give any history this is [facility].  The patient does not in any capacity to answer my questions he will start cursing but will not answer any questions.  Material states that they have no known injuries to the patient Past Medical History:  Diagnosis Date  . Bipolar 1 disorder [facility](HCC)     Patient Active Problem List   Diagnosis Date Noted  . Acute delirium 04/26/2017  . Bipolar disorder, curr episode mixed, severe, w/o psychotic features (HCC) 04/10/2016  . 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 Psychosis (HCC) 04/01/2016    No past surgical history on file.      Home Medications    Prior to Admission medications   Medication Sig Start Date End Date Taking? Authorizing Provider  ARIPiprazole (ABILIFY) 20 MG tablet Take 20 mg by mouth at bedtime.   Yes [provider]  divalproex (DEPAKOTE) 500 MG DR tablet Take 500 mg by mouth 2 (two) times daily.   Yes [provider]  hydrOXYzine (VISTARIL) 100 MG capsule Take 100 mg by mouth 2 (two) times daily.   Yes [provider]  amantadine (SYMMETREL) 100 MG capsule Take 1 capsule (100 mg total) by mouth 2 (two) times daily. Patient not taking: Reported on 04/26/2017 04/16/16   Oneta RackLewis, Tanika N, NP  clonazePAM (KLONOPIN) 0.5 MG tablet Take 1 tablet (0.5 mg total) by mouth 2 (two) times daily. Patient not taking: Reported on 04/26/2017 04/16/16   Oneta RackLewis, Tanika N, NP  dicyclomine (BENTYL) 20 MG tablet Take 1 tablet (20 mg total) by mouth 2 (two) times daily. Patient not taking:  Reported on 04/26/2017 07/19/16   Elpidio AnisUpstill, Shari, PA-C  hydrOXYzine (ATARAX/VISTARIL) 25 MG tablet Take 1 tablet (25 mg total) by mouth every 6 (six) hours as needed for anxiety. Patient not taking: Reported on 04/26/2017 04/16/16   Oneta RackLewis, Tanika N, NP  lithium carbonate 300 MG capsule Take 1 capsule (300 mg total) by mouth daily with lunch. Patient not taking: Reported on 04/26/2017 04/16/16   Oneta RackLewis, Tanika N, NP  lithium carbonate 600 MG capsule Take 1 capsule (600 mg total) by mouth 2 (two) times daily with a meal. Patient not taking: Reported on 04/26/2017 04/16/16   Oneta RackLewis, Tanika N, NP  mirtazapine (REMERON) 7.5 MG tablet Take 1 tablet (7.5 mg total) by mouth at bedtime. Patient not taking: Reported on 04/26/2017 04/16/16   Oneta RackLewis, Tanika N, NP  ondansetron (ZOFRAN) 4 MG tablet Take 1 tablet (4 mg total) by mouth every 6 (six) hours. Patient not taking: Reported on 04/26/2017 07/19/16   Elpidio AnisUpstill, Shari, PA-C  OXcarbazepine (TRILEPTAL) 150 MG tablet Take 1 tablet (150 mg total) by mouth 2 (two) times daily. Patient not taking: Reported on 04/26/2017 04/16/16   Oneta RackLewis, Tanika N, NP    Family History Family History  Problem Relation Age of Onset  . Diabetes Mother     Social History Social History   Tobacco Use  . Smoking status: Never Smoker  . Smokeless tobacco: Never Used  Substance Use  Topics  . Alcohol use: No  . Drug use: No     Allergies   Depakote [divalproex sodium]; Geodon [ziprasidone hcl]; and Zoloft [sertraline hcl]   Review of Systems Review of Systems Level 5 caveat due to altered mental status  Physical Exam Updated Vital Signs BP 116/73   Pulse 88   Temp 98.1 F (36.7 C)   Resp 18   Wt 100 kg (220 lb 7.4 oz)   SpO2 100%   BMI 29.09 kg/m   Physical Exam  Constitutional: He appears well-developed and well-nourished. No distress.  HENT:  Head: Normocephalic and atraumatic.  Eyes: Pupils are equal, round, and reactive to light.  Neck: Normal range of motion.  Neck supple.  Cardiovascular: Normal rate, regular rhythm and normal heart sounds. Exam reveals no gallop and no friction rub.  No murmur heard. Pulmonary/Chest: Effort normal.  Patient has decreased breath sounds bilaterally but not sure of this due to inability to follow commands.  There is no crepitance or bruising noted to the chest wall.  Abdominal: Soft. Bowel sounds are normal. He exhibits no distension. There is no tenderness.  Neurological: He is alert. He exhibits normal muscle tone. Coordination normal.  Skin: Skin is warm and dry. No rash noted. No erythema.  Psychiatric: His affect is angry and inappropriate. He is agitated and aggressive.  Nursing note and vitals reviewed.    ED Treatments / Results  Labs (all labs ordered are listed, but only abnormal results are displayed) Labs Reviewed  COMPREHENSIVE METABOLIC PANEL - Abnormal; Notable for the following components:      Result Value   Chloride 100 (*)    CO2 20 (*)    Glucose, Bld 117 (*)    BUN 37 (*)    Creatinine, Ser 2.30 (*)    Total Protein 8.3 (*)    AST 421 (*)    ALT 145 (*)    Total Bilirubin 2.6 (*)    GFR calc non Af Amer 38 (*)    GFR calc Af Amer 44 (*)    Anion gap 21 (*)    All other components within normal limits  CBC - Abnormal; Notable for the following components:   WBC 19.8 (*)    MCV 74.7 (*)    All other components within normal limits  URINALYSIS, ROUTINE W REFLEX MICROSCOPIC - Abnormal; Notable for the following components:   Color, Urine AMBER (*)    Specific Gravity, Urine >1.046 (*)    Hgb urine dipstick LARGE (*)    Ketones, ur 20 (*)    Protein, ur 100 (*)    Squamous Epithelial / LPF 0-5 (*)    All other components within normal limits  BLOOD GAS, ARTERIAL - Abnormal; Notable for the following components:   pH, Arterial 7.343 (*)    pO2, Arterial 339 (*)    Acid-base deficit 3.5 (*)    All other components within normal limits  I-STAT CG4 LACTIC ACID, ED - Abnormal;  Notable for the following components:   Lactic Acid, Venous 4.25 (*)    All other components within normal limits  I-STAT CHEM 8, ED - Abnormal; Notable for the following components:   BUN 33 (*)    Creatinine, Ser 2.20 (*)    Glucose, Bld 112 (*)    All other components within normal limits  URINE CULTURE  CULTURE, BLOOD (ROUTINE X 2)  CULTURE, BLOOD (ROUTINE X 2)  ETHANOL  RAPID URINE DRUG SCREEN, HOSP PERFORMED  LITHIUM LEVEL  HEPATITIS PANEL, ACUTE  HIV ANTIBODY (ROUTINE TESTING)  BASIC METABOLIC PANEL  CBC  MAGNESIUM  PHOSPHORUS  I-STAT TROPONIN, ED    EKG None  Radiology Ct Head Wo Contrast  Result Date: 04/26/2017 CLINICAL DATA:  Altered mental status EXAM: CT HEAD WITHOUT CONTRAST TECHNIQUE: Contiguous axial images were obtained from the base of the skull through the vertex without intravenous contrast. COMPARISON:  None. FINDINGS: Brain: No mass lesion, intraparenchymal hemorrhage or extra-axial collection. No evidence of acute cortical infarct. Normal appearance of the brain parenchyma and extra axial spaces for age. Vascular: No hyperdense vessel or unexpected vascular calcification. 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  Normal calvarium. Sinuses/Orbits: No sinus fluid levels or advanced mucosal thickening. No mastoid effusion. Normal orbits. IMPRESSION: Normal brain. Electronically Signed   By: Deatra Robinson M.D.   On: 04/26/2017 21:02   Ct Soft Tissue Neck W Contrast  Result Date: 04/26/2017 CLINICAL DATA:  Vomiting around endotracheal tube. EXAM: CT NECK WITH CONTRAST TECHNIQUE: Multidetector CT imaging of the neck was performed using the standard protocol following the bolus administration of intravenous contrast. CONTRAST:  80mL ISOVUE-300 IOPAMIDOL (ISOVUE-300) INJECTION 61% COMPARISON:  None. FINDINGS: PHARYNX AND LARYNX: The endotracheal tube tip is in the proximal right mainstem bronchus. Nasopharyngeal opacification is common in the setting of  endotracheal intubation. Nasogastric tube courses below the field of view. No focal laryngeal or pharyngeal abnormality otherwise. SALIVARY GLANDS: --Parotid: No mass lesion or inflammation. No sialolithiasis or ductal dilatation. Multifocal free air in the carotid spaces. --Submandibular: Symmetric without inflammation. No sialolithiasis or ductal dilatation. Multifocal free air in both submandibular spaces. --Sublingual: Normal. No ranula or other visible lesion of the base of tongue and floor of mouth. THYROID: Normal. LYMPH NODES: No enlarged or abnormal density lymph nodes. VASCULAR: Major cervical vessels are patent. LIMITED INTRACRANIAL: Normal. VISUALIZED ORBITS: Normal. MASTOIDS AND VISUALIZED PARANASAL SINUSES: No fluid levels or advanced mucosal thickening. No mastoid effusion. SKELETON: No bony spinal canal stenosis. No lytic or blastic lesions. UPPER CHEST: There is a small focal defect of the posterior trachea just below the thoracic inlet (series 4, image 102, 105). OTHER: There is extensive emphysema throughout the deep spaces of the neck. IMPRESSION: 1. Small defect of the posterior aspect of the trachea just below the level of the thoracic inlet may indicate a small tracheal perforation, which is a potential source for the extensive pneumomediastinum and emphysema of the deep spaces of the neck. If there is no history of trauma, however, an esophageal perforation is probably still more likely, since the pneumomediastinum was present prior to intubation. 2. Endotracheal tube tip is in the proximal right mainstem bronchus. Electronically Signed   By: Deatra Robinson M.D.   On: 04/26/2017 20:59   Ct Chest W Contrast  Result Date: 04/26/2017 CLINICAL DATA:  Generalized abdominal pain, elevated liver function tests. Pneumomediastinum. EXAM: CT CHEST, ABDOMEN, AND PELVIS WITH CONTRAST TECHNIQUE: Multidetector CT imaging of the chest, abdomen and pelvis was performed following the standard protocol  during bolus administration of intravenous contrast. However, arms could not be brought above the patient's head, resulting in artifact. CONTRAST:  80mL ISOVUE-300 IOPAMIDOL (ISOVUE-300) INJECTION 61% COMPARISON:  Radiograph of same day. FINDINGS: CT CHEST FINDINGS Cardiovascular: No significant vascular findings. Normal heart size. No pericardial effusion. Mediastinum/Nodes: Endotracheal tube seen directed into right mainstem bronchus. Nasogastric tube is seen going down the esophagus, which is looped in the stomach and distal tip is seen extending back to  proximal esophagus. Extensive pneumomediastinum is noted as well as pneumopericardium. Extensive soft tissue gas is seen in the supraclavicular region and cervical region. Lungs/Pleura: No pneumothorax or pleural effusion is noted. Lungs are clear. Musculoskeletal: No chest wall mass or suspicious bone lesions identified. CT ABDOMEN PELVIS FINDINGS Hepatobiliary: No focal liver abnormality is seen. No gallstones, gallbladder wall thickening, or biliary dilatation. Pancreas: Unremarkable. No pancreatic ductal dilatation or surrounding inflammatory changes. Spleen: Normal in size without focal abnormality. Adrenals/Urinary Tract: Adrenal glands are unremarkable. Kidneys are normal, without renal calculi, focal lesion, or hydronephrosis. Bladder is unremarkable. Stomach/Bowel: Stomach is within normal limits. Appendix appears normal. No evidence of bowel wall thickening, distention, or inflammatory changes. Vascular/Lymphatic: No significant vascular findings are present. No enlarged abdominal or pelvic lymph nodes. Reproductive: Prostate is unremarkable. Other: No abdominal wall hernia or abnormality. No abdominopelvic ascites. Musculoskeletal: No acute or significant osseous findings. IMPRESSION: Extensive pneumomediastinum and pneumopericardium is noted. Extensive soft tissue gas is seen in the supraclavicular and cervical soft tissues. This is concerning for  rupture of bronchus or esophagus. Endotracheal tube is directed into right mainstem bronchus; withdrawal by 3-4 cm is recommended. Critical Value/emergent results were called by telephone at the time of interpretation on 04/26/2017 at 9:03 pm to Dr. Chaney Malling , who verbally acknowledged these results. Nasogastric tube is seen looped within the stomach, with distal tip directed back into the esophagus and positioned within proximal esophagus. No definite abnormality seen in the abdomen or pelvis. Electronically Signed   By: Lupita Raider, M.D.   On: 04/26/2017 21:05   Ct Abdomen Pelvis W Contrast  Result Date: 04/26/2017 CLINICAL DATA:  Generalized abdominal pain, elevated liver function tests. Pneumomediastinum. EXAM: CT CHEST, ABDOMEN, AND PELVIS WITH CONTRAST TECHNIQUE: Multidetector CT imaging of the chest, abdomen and pelvis was performed following the standard protocol during bolus administration of intravenous contrast. However, arms could not be brought above the patient's head, resulting in artifact. CONTRAST:  80mL ISOVUE-300 IOPAMIDOL (ISOVUE-300) INJECTION 61% COMPARISON:  Radiograph of same day. FINDINGS: CT CHEST FINDINGS Cardiovascular: No significant vascular findings. Normal heart size. No pericardial effusion. Mediastinum/Nodes: Endotracheal tube seen directed into right mainstem bronchus. Nasogastric tube is seen going down the esophagus, which is looped in the stomach and distal tip is seen extending back to proximal esophagus. Extensive pneumomediastinum is noted as well as pneumopericardium. Extensive soft tissue gas is seen in the supraclavicular region and cervical region. Lungs/Pleura: No pneumothorax or pleural effusion is noted. Lungs are clear. Musculoskeletal: No chest wall mass or suspicious bone lesions identified. CT ABDOMEN PELVIS FINDINGS Hepatobiliary: No focal liver abnormality is seen. No gallstones, gallbladder wall thickening, or biliary dilatation. Pancreas: Unremarkable.  No pancreatic ductal dilatation or surrounding inflammatory changes. Spleen: Normal in size without focal abnormality. Adrenals/Urinary Tract: Adrenal glands are unremarkable. Kidneys are normal, without renal calculi, focal lesion, or hydronephrosis. Bladder is unremarkable. Stomach/Bowel: Stomach is within normal limits. Appendix appears normal. No evidence of bowel wall thickening, distention, or inflammatory changes. Vascular/Lymphatic: No significant vascular findings are present. No enlarged abdominal or pelvic lymph nodes. Reproductive: Prostate is unremarkable. Other: No abdominal wall hernia or abnormality. No abdominopelvic ascites. Musculoskeletal: No acute or significant osseous findings. IMPRESSION: Extensive pneumomediastinum and pneumopericardium is noted. Extensive soft tissue gas is seen in the supraclavicular and cervical soft tissues. This is concerning for rupture of bronchus or esophagus. Endotracheal tube is directed into right mainstem bronchus; withdrawal by 3-4 cm is recommended. Critical Value/emergent results were called by telephone at the  time of interpretation on 04/26/2017 at 9:03 pm to Dr. Chaney Malling , who verbally acknowledged these results. Nasogastric tube is seen looped within the stomach, with distal tip directed back into the esophagus and positioned within proximal esophagus. No definite abnormality seen in the abdomen or pelvis. Electronically Signed   By: Lupita Raider, M.D.   On: 04/26/2017 21:05   Dg Chest Port 1 View  Result Date: 04/26/2017 CLINICAL DATA:  ETT placement EXAM: PORTABLE CHEST 1 VIEW COMPARISON:  CT 04/26/2017, chest x-ray 04/26/2017 FINDINGS: Endotracheal tube tip is about 2.4 cm superior to the carina. Esophageal tube has been removed. Pneumopericardium and pneumomediastinum with subcutaneous emphysema in the neck. No consolidation or effusion. Stable cardiomediastinal silhouette IMPRESSION: 1. Endotracheal tube tip is about 2.4 cm superior to the  carina 2. Removal of esophageal tube 3. Pneumopericardium and pneumomediastinum with large amount of soft tissue emphysema at the neck Electronically Signed   By: Jasmine Pang M.D.   On: 04/26/2017 21:19   Dg Chest Port 1 View  Result Date: 04/26/2017 CLINICAL DATA:  ETT placement EXAM: PORTABLE CHEST 1 VIEW COMPARISON:  04/26/2017 FINDINGS: Endotracheal tube tip is about 1 cm superior to the carina. Esophageal tube appears looped back upon itself, side-port overlies the upper esophagus. No consolidation or pleural effusion. Pneumomediastinum with subcutaneous emphysema in the supraclavicular fossa. No discrete pneumothorax is seen. IMPRESSION: 1. Endotracheal tube tip about 1 cm superior to carina 2. Esophageal tube is looped upon itself and the side port appears to be positioned over the upper mediastinum/esophagus. Repositioning recommended 3. Pneumomediastinum with moderate subcutaneous emphysema in the neck and supraclavicular fossa as before Electronically Signed   By: Jasmine Pang M.D.   On: 04/26/2017 19:47   Dg Chest Port 1 View  Result Date: 04/26/2017 CLINICAL DATA:  Leukocytosis EXAM: PORTABLE CHEST 1 VIEW COMPARISON:  None. FINDINGS: Pneumomediastinum with soft tissue emphysema extending into the neck. No pneumothorax. No pleural effusion or focal consolidation. Normal cardiomediastinal silhouette. No acute osseous abnormality. IMPRESSION: Pneumomediastinum and soft tissue emphysema extending into the neck soft tissues. Differential considerations include perforated esophagus versus tracheobronchial injury versus penetrating injury. Critical Value/emergent results were called by telephone at the time of interpretation on 04/26/2017 at 6:39pm to Dr. Chaney Malling , who verbally acknowledged these results. Electronically Signed   By: Elige Ko   On: 04/26/2017 18:41   US Abdomen Limited Ruq  Result Date: 04/26/2017 CLINICAL DATA:  Increased LFTs EXAM: ULTRASOUND ABDOMEN LIMITED RIGHT UPPER  QUADRANT COMPARISON:  None. FINDINGS: Gallbladder: No gallstones or wall thickening visualized. No sonographic Murphy sign noted by sonographer. Common bile duct: Diameter: 4.5 mm Liver: No focal lesion identified. Increased hepatic parenchymal echogenicity. Portal vein is patent on color Doppler imaging with normal direction of blood flow towards the liver. IMPRESSION: 1. No cholelithiasis or sonographic evidence of acute cholecystitis. 2. Increased hepatic echogenicity as can be seen with hepatic steatosis. Electronically Signed   By: Elige Ko   On: 04/26/2017 19:17    Procedures Procedures (including critical care time)  Medications Ordered in ED Medications  midazolam (VERSED) injection 2 mg (has no administration in time range)  midazolam (VERSED) injection 2 mg (has no administration in time range)  midazolam (VERSED) 50 mg in sodium chloride 0.9 % 50 mL (1 mg/mL) infusion (3 mg/hr Intravenous Rate/Dose Change 04/26/17 2223)  midazolam (VERSED) bolus via infusion 1-2 mg (has no administration in time range)  fentaNYL (SUBLIMAZE) 2,500 mcg in sodium chloride 0.9 % 250  mL (10 mcg/mL) infusion (100 mcg/hr Intravenous Rate/Dose Change 04/26/17 2223)  fentaNYL (SUBLIMAZE) bolus via infusion 50 mcg (has no administration in time range)  0.9 %  sodium chloride infusion (has no administration in time range)  heparin injection 5,000 Units (has no administration in time range)  pantoprazole (PROTONIX) injection 40 mg (has no administration in time range)  sodium chloride 0.9 % bolus 1,000 mL (0 mLs Intravenous Stopped 04/26/17 1854)  LORazepam (ATIVAN) injection 2 mg (2 mg Intravenous Given 04/26/17 1724)  haloperidol lactate (HALDOL) injection 5 mg (5 mg Intravenous Given 04/26/17 1724)  diphenhydrAMINE (BENADRYL) injection 25 mg (25 mg Intravenous Given 04/26/17 1724)  vancomycin (VANCOCIN) IVPB 1000 mg/200 mL premix (0 mg Intravenous Stopped 04/26/17 2304)  piperacillin-tazobactam (ZOSYN) IVPB  3.375 g (0 g Intravenous Stopped 04/26/17 2218)  sodium chloride 0.9 % bolus 1,000 mL (0 mLs Intravenous Stopped 04/26/17 2203)  etomidate (AMIDATE) injection 20 mg (40 mg Intravenous Given 04/26/17 1909)  succinylcholine (ANECTINE) injection 120 mg (120 mg Intravenous Given 04/26/17 1909)  propofol (DIPRIVAN) 1000 MG/100ML infusion (0 mcg/kg/min  100 kg Intravenous Stopped 04/26/17 2125)  iopamidol (ISOVUE-300) 61 % injection (80 mLs  Contrast Given 04/26/17 2000)  sodium chloride 0.9 % injection (9 mLs  Given by Other 04/26/17 2133)  fentaNYL (SUBLIMAZE) injection 50 mcg (50 mcg Intravenous Given 04/26/17 2000)  midazolam (VERSED) 5 MG/5ML injection 4 mg (4 mg Intravenous Given 04/26/17 2006)  fentaNYL (SUBLIMAZE) injection 100 mcg (100 mcg Intravenous Given 04/26/17 2006)     Initial Impression / Assessment and Plan / ED Course  I have reviewed the triage vital signs and the nursing notes.  Pertinent labs & imaging results that were available during my care of the patient were reviewed by me and considered in my medical decision making (see chart for details).    CRITICAL CARE Performed by: Jamesetta Orleans Tuyet Bader Total critical care time: 45 minutes Critical care time was exclusive of separately billable procedures and treating other patients. Critical care was necessary to treat or prevent imminent or life-threatening deterioration. Critical care was time spent personally by me on the following activities: development of treatment plan with patient and/or surrogate as well as nursing, discussions with consultants, evaluation of patient's response to treatment, examination of patient, obtaining history from patient or surrogate, ordering and performing treatments and interventions, ordering and review of laboratory studies, ordering and review of radiographic studies, pulse oximetry and re-evaluation of patient's condition.  Patient required further interventions due to the fact that he had  pneumomediastinum noted on chest x-ray and CT scan findings patient was intubated to help further assessment.  The patient was stabilized and admitted to the ICU.  Vital signs never deviated in a significant fashion  Final Clinical Impressions(s) / ED Diagnoses   Final diagnoses:  Elevated LFTs  Pneumomediastinum [facility])  Pneumopericardium    ED Discharge Orders    None       Charlestine Night, PA-C 05/02/17 1440    Charlynne Pander, MD 05/02/17 [phone]

## 2017-04-27 NOTE — Progress Notes (Signed)
STAFF NOTE: I, Dr Lavinia SharpsM Mckenzy Salazar have personally reviewed patient's available data, including medical history, events of note, physical examination and test results as part of my evaluation. I have discussed with resident/NP and other care providers such as pharmacist, RN and RRT.  In addition,  I personally evaluated patient and elicited key findings of   S: see APP notes 04/27/2017 for details. He was admitted 04/26/2017 . Currently on ventilator. CVS planning to evaluate for fistula. Sedated on vent     O: Blood pressure (!) 99/47, pulse 68, temperature 98.5 F (36.9 C), temperature source Oral, resp. rate 17, height 6\' 1"  (1.854 m), weight 129.6 kg (285 lb 11.5 oz), SpO2 100 %.     Recent Labs  Lab 04/26/17 1527 04/26/17 1607 04/27/17 0227  HGB 14.7 15.3 12.6*  HCT 41.6 45.0 36.9*  WBC 19.8*  --  16.8*  PLT 181  --  149*   Recent Labs  Lab 04/26/17 1535 04/27/17 0227 04/27/17 0228 04/27/17 0819  LATICACIDVEN 4.25*  --  1.2 1.5  PROCALCITON  --  <0.10  --   --    Recent Labs  Lab 04/26/17 1527 04/26/17 1607 04/27/17 0227  NA 141 142 141  K 3.7 3.6 3.5  CL 100* 102 106  CO2 20*  --  21*  GLUCOSE 117* 112* 116*  BUN 37* 33* 23*  CREATININE 2.30* 2.20* 1.59*  CALCIUM 9.5  --  8.6*  MG  --   --  2.1  PHOS  --   --  4.7*     Dg Chest Port 1 View  Result Date: 04/27/2017 personally visualzed CLINICAL DATA:  Ventilator dependent. EXAM: PORTABLE CHEST 1 VIEW COMPARISON:  04/26/2017 FINDINGS: Endotracheal tube in satisfactory position. Cardiomediastinal silhouette is normal. Mediastinal contours appear intact. Persistent pneumopericardium and pneumomediastinum. Osseous structures are without acute abnormality. Soft tissue emphysema tracking to the neck. IMPRESSION: Persistent pneumopericardium and pneumomediastinum. Soft tissue emphysema tracking to the neck. Electronically Signed   By: Ted Mcalpineobrinka  Dimitrova M.D.   On: 04/27/2017 02:57    A: acute reso failure due to  agitated delrium and pneumomediastium with suspicion of airay injury Acute agitaed encephalopathy - sedated AKi better  P: full vent support Sedation Check liapse, amylase, CK  Contnue fluids Appreciate CVTS input   .  Rest per NP/medical resident whose note is outlined above and that I agree with  The patient is critically ill with multiple organ systems failure and requires high complexity decision making for assessment and support, frequent evaluation and titration of therapies, application of advanced monitoring technologies and extensive interpretation of multiple databases.   Critical Care Time devoted to patient care services described in this note is  30  Minutes. This time reflects time of care of this signee Dr Kalman ShanMurali Adaleena Mooers. This critical care time does not reflect procedure time, or teaching time or supervisory time of PA/NP/Med student/Med Resident etc but could involve care discussion time    Dr. Kalman ShanMurali Jonmichael Beadnell, M.D., [facility] Campus[facility].C.C.P Pulmonary and Critical Care Medicine Staff Physician [facility] Pager: [phone], If no answer or between  15:00h - 7:00h: call [phone]  04/27/2017 11:16 AM

## 2017-04-27 NOTE — Progress Notes (Signed)
.. ..  Name: Brandon Fuentes MRN: [id number] DOB: December 27, 1990    ADMISSION DATE:  04/26/2017 CONSULTATION DATE:  04/26/17  REFERRING MD :  ED PHYSICIAN- [facility]  CHIEF COMPLAINT:  Agitation and combative  BRIEF PATIENT DESCRIPTION: 27 yr old Bipolar pt intubated found to have pneumomediastinum and pneumopericardium PCCM consulted for admission.  SIGNIFICANT EVENTS  intubated  STUDIES:  3/29 Labs:AGMA, lactic acid 4.25, UDS negative, BUN/Cr 33/2.2 (elevated from baseline)  CT official read: Extensive pneumomediastinum and pneumopericardium is noted. Extensive soft tissue gas is seen in the supraclavicular and cervical soft tissues.  This is concerning for rupture of bronchus or esophagus.  Endotracheal tube is directed into right mainstem bronchus; withdrawal by 3-4 cm is recommended.    HISTORY OF PRESENT ILLNESS:  (Pt intubated, history acquired from other care providers including RN and Triage notes)  27 yr old male with Bipolar Type 1 disorder [facility] with a recent history of high lithium levels. The medication was discontinued and he has now received no therapy for the last 3-4 days. Combative at the jail brought in for medical clearance. In the ER the pt was combative, screaming, cursing and not answering questions appropiately.  He received  Imaging showed a Pneumomediastinum with Pneumopericardium and ETT in the Right mainstem bronchus. PCCM consulted for admission.  Upon my evaluation of the chart, objective information and the patient, a consult was placed to CTS due to the fact that no clear etiology for the pneumomediastinum.  CTS recommended transfer to [facility] for management.   SUBJECTIVE:  Pt remains intubated on vent  In ER last night extreme agitation/combativness , on fent/versed with improvement .  For esophagram today  Officers at bedside , pt with restraints in place    VITAL SIGNS: Temp:  [97.5 F (36.4 C)-98.9 F (37.2 C)] 98.2 F (36.8 C)  (03/30 0030) Pulse Rate:  [66-112] 66 (03/30 0744) Resp:  [12-22] 16 (03/30 0744) BP: (97-147)/(45-106) 99/51 (03/30 0744) SpO2:  [96 %-100 %] 100 % (03/30 0744) FiO2 (%):  [40 %] 40 % (03/30 0744) Weight:  [220 lb 7.4 oz (100 kg)-285 lb 11.5 oz (129.6 kg)] 285 lb 11.5 oz (129.6 kg) (03/30 0300)  PHYSICAL EXAMINATION: General:  Intubated and sedated  Neuro:  Sedated on vent  HEENT:  ETT in place, sq air along neck , periobrital edema noted. Cardiovascular:  RRR , NSR  Lungs:  CTA ., no wheezing  Abdomen:  Soft , hypoactive BS  Musculoskeletal:  Intact, wrist restraints , chain restraints on LE .  Skin: no skin breakdown   Recent Labs  Lab 04/26/17 1527 04/26/17 1607 04/27/17 0227  NA 141 142 141  K 3.7 3.6 3.5  CL 100* 102 106  CO2 20*  --  21*  BUN 37* 33* 23*  CREATININE 2.30* 2.20* 1.59*  GLUCOSE 117* 112* 116*   Recent Labs  Lab 04/26/17 1527 04/26/17 1607 04/27/17 0227  HGB 14.7 15.3 12.6*  HCT 41.6 45.0 36.9*  WBC 19.8*  --  16.8*  PLT 181  --  149*   Ct Head Wo Contrast  Result Date: 04/26/2017 CLINICAL DATA:  Altered mental status EXAM: CT HEAD WITHOUT CONTRAST TECHNIQUE: Contiguous axial images were obtained from the base of the skull through the vertex without intravenous contrast. COMPARISON:  None. FINDINGS: Brain: No mass lesion, intraparenchymal hemorrhage or extra-axial collection. No evidence of acute cortical infarct. Normal appearance of the brain parenchyma and extra axial spaces for age. Vascular: No  hyperdense vessel or unexpected vascular calcification. 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  Normal calvarium. Sinuses/Orbits: No sinus fluid levels or advanced mucosal thickening. No mastoid effusion. Normal orbits. IMPRESSION: Normal brain. Electronically Signed   By: Deatra RobinsonKevin  Herman M.D.   On: 04/26/2017 21:02   Ct Soft Tissue Neck W Contrast  Result Date: 04/26/2017 CLINICAL DATA:  Vomiting around endotracheal tube. EXAM: CT NECK WITH  CONTRAST TECHNIQUE: Multidetector CT imaging of the neck was performed using the standard protocol following the bolus administration of intravenous contrast. CONTRAST:  80mL ISOVUE-300 IOPAMIDOL (ISOVUE-300) INJECTION 61% COMPARISON:  None. FINDINGS: PHARYNX AND LARYNX: The endotracheal tube tip is in the proximal right mainstem bronchus. Nasopharyngeal opacification is common in the setting of endotracheal intubation. Nasogastric tube courses below the field of view. No focal laryngeal or pharyngeal abnormality otherwise. SALIVARY GLANDS: --Parotid: No mass lesion or inflammation. No sialolithiasis or ductal dilatation. Multifocal free air in the carotid spaces. --Submandibular: Symmetric without inflammation. No sialolithiasis or ductal dilatation. Multifocal free air in both submandibular spaces. --Sublingual: Normal. No ranula or other visible lesion of the base of tongue and floor of mouth. THYROID: Normal. LYMPH NODES: No enlarged or abnormal density lymph nodes. VASCULAR: Major cervical vessels are patent. LIMITED INTRACRANIAL: Normal. VISUALIZED ORBITS: Normal. MASTOIDS AND VISUALIZED PARANASAL SINUSES: No fluid levels or advanced mucosal thickening. No mastoid effusion. SKELETON: No bony spinal canal stenosis. No lytic or blastic lesions. UPPER CHEST: There is a small focal defect of the posterior trachea just below the thoracic inlet (series 4, image 102, 105). OTHER: There is extensive emphysema throughout the deep spaces of the neck. IMPRESSION: 1. Small defect of the posterior aspect of the trachea just below the level of the thoracic inlet may indicate a small tracheal perforation, which is a potential source for the extensive pneumomediastinum and emphysema of the deep spaces of the neck. If there is no history of trauma, however, an esophageal perforation is probably still more likely, since the pneumomediastinum was present prior to intubation. 2. Endotracheal tube tip is in the proximal right  mainstem bronchus. Electronically Signed   By: Deatra RobinsonKevin  Herman M.D.   On: 04/26/2017 20:59   Ct Chest W Contrast  Result Date: 04/26/2017 CLINICAL DATA:  Generalized abdominal pain, elevated liver function tests. Pneumomediastinum. EXAM: CT CHEST, ABDOMEN, AND PELVIS WITH CONTRAST TECHNIQUE: Multidetector CT imaging of the chest, abdomen and pelvis was performed following the standard protocol during bolus administration of intravenous contrast. However, arms could not be brought above the patient's head, resulting in artifact. CONTRAST:  80mL ISOVUE-300 IOPAMIDOL (ISOVUE-300) INJECTION 61% COMPARISON:  Radiograph of same day. FINDINGS: CT CHEST FINDINGS Cardiovascular: No significant vascular findings. Normal heart size. No pericardial effusion. Mediastinum/Nodes: Endotracheal tube seen directed into right mainstem bronchus. Nasogastric tube is seen going down the esophagus, which is looped in the stomach and distal tip is seen extending back to proximal esophagus. Extensive pneumomediastinum is noted as well as pneumopericardium. Extensive soft tissue gas is seen in the supraclavicular region and cervical region. Lungs/Pleura: No pneumothorax or pleural effusion is noted. Lungs are clear. Musculoskeletal: No chest wall mass or suspicious bone lesions identified. CT ABDOMEN PELVIS FINDINGS Hepatobiliary: No focal liver abnormality is seen. No gallstones, gallbladder wall thickening, or biliary dilatation. Pancreas: Unremarkable. No pancreatic ductal dilatation or surrounding inflammatory changes. Spleen: Normal in size without focal abnormality. Adrenals/Urinary Tract: Adrenal glands are unremarkable. Kidneys are normal, without renal calculi, focal lesion, or hydronephrosis. Bladder is unremarkable. Stomach/Bowel: Stomach is  within normal limits. Appendix appears normal. No evidence of bowel wall thickening, distention, or inflammatory changes. Vascular/Lymphatic: No significant vascular findings are present. No  enlarged abdominal or pelvic lymph nodes. Reproductive: Prostate is unremarkable. Other: No abdominal wall hernia or abnormality. No abdominopelvic ascites. Musculoskeletal: No acute or significant osseous findings. IMPRESSION: Extensive pneumomediastinum and pneumopericardium is noted. Extensive soft tissue gas is seen in the supraclavicular and cervical soft tissues. This is concerning for rupture of bronchus or esophagus. Endotracheal tube is directed into right mainstem bronchus; withdrawal by 3-4 cm is recommended. Critical Value/emergent results were called by telephone at the time of interpretation on 04/26/2017 at 9:03 pm to Dr. Chaney Malling , who verbally acknowledged these results. Nasogastric tube is seen looped within the stomach, with distal tip directed back into the esophagus and positioned within proximal esophagus. No definite abnormality seen in the abdomen or pelvis. Electronically Signed   By: Lupita Raider, M.D.   On: 04/26/2017 21:05   Ct Abdomen Pelvis W Contrast  Result Date: 04/26/2017 CLINICAL DATA:  Generalized abdominal pain, elevated liver function tests. Pneumomediastinum. EXAM: CT CHEST, ABDOMEN, AND PELVIS WITH CONTRAST TECHNIQUE: Multidetector CT imaging of the chest, abdomen and pelvis was performed following the standard protocol during bolus administration of intravenous contrast. However, arms could not be brought above the patient's head, resulting in artifact. CONTRAST:  80mL ISOVUE-300 IOPAMIDOL (ISOVUE-300) INJECTION 61% COMPARISON:  Radiograph of same day. FINDINGS: CT CHEST FINDINGS Cardiovascular: No significant vascular findings. Normal heart size. No pericardial effusion. Mediastinum/Nodes: Endotracheal tube seen directed into right mainstem bronchus. Nasogastric tube is seen going down the esophagus, which is looped in the stomach and distal tip is seen extending back to proximal esophagus. Extensive pneumomediastinum is noted as well as pneumopericardium. Extensive  soft tissue gas is seen in the supraclavicular region and cervical region. Lungs/Pleura: No pneumothorax or pleural effusion is noted. Lungs are clear. Musculoskeletal: No chest wall mass or suspicious bone lesions identified. CT ABDOMEN PELVIS FINDINGS Hepatobiliary: No focal liver abnormality is seen. No gallstones, gallbladder wall thickening, or biliary dilatation. Pancreas: Unremarkable. No pancreatic ductal dilatation or surrounding inflammatory changes. Spleen: Normal in size without focal abnormality. Adrenals/Urinary Tract: Adrenal glands are unremarkable. Kidneys are normal, without renal calculi, focal lesion, or hydronephrosis. Bladder is unremarkable. Stomach/Bowel: Stomach is within normal limits. Appendix appears normal. No evidence of bowel wall thickening, distention, or inflammatory changes. Vascular/Lymphatic: No significant vascular findings are present. No enlarged abdominal or pelvic lymph nodes. Reproductive: Prostate is unremarkable. Other: No abdominal wall hernia or abnormality. No abdominopelvic ascites. Musculoskeletal: No acute or significant osseous findings. IMPRESSION: Extensive pneumomediastinum and pneumopericardium is noted. Extensive soft tissue gas is seen in the supraclavicular and cervical soft tissues. This is concerning for rupture of bronchus or esophagus. Endotracheal tube is directed into right mainstem bronchus; withdrawal by 3-4 cm is recommended. Critical Value/emergent results were called by telephone at the time of interpretation on 04/26/2017 at 9:03 pm to Dr. Chaney Malling , who verbally acknowledged these results. Nasogastric tube is seen looped within the stomach, with distal tip directed back into the esophagus and positioned within proximal esophagus. No definite abnormality seen in the abdomen or pelvis. Electronically Signed   By: Lupita Raider, M.D.   On: 04/26/2017 21:05   Dg Chest Port 1 View  Result Date: 04/27/2017 CLINICAL DATA:  Ventilator dependent.  EXAM: PORTABLE CHEST 1 VIEW COMPARISON:  04/26/2017 FINDINGS: Endotracheal tube in satisfactory position. Cardiomediastinal silhouette is normal. Mediastinal contours appear intact.  Persistent pneumopericardium and pneumomediastinum. Osseous structures are without acute abnormality. Soft tissue emphysema tracking to the neck. IMPRESSION: Persistent pneumopericardium and pneumomediastinum. Soft tissue emphysema tracking to the neck. Electronically Signed   By: Ted Mcalpine M.D.   On: 04/27/2017 02:57   Dg Chest Port 1 View  Result Date: 04/26/2017 CLINICAL DATA:  ETT placement EXAM: PORTABLE CHEST 1 VIEW COMPARISON:  CT 04/26/2017, chest x-ray 04/26/2017 FINDINGS: Endotracheal tube tip is about 2.4 cm superior to the carina. Esophageal tube has been removed. Pneumopericardium and pneumomediastinum with subcutaneous emphysema in the neck. No consolidation or effusion. Stable cardiomediastinal silhouette IMPRESSION: 1. Endotracheal tube tip is about 2.4 cm superior to the carina 2. Removal of esophageal tube 3. Pneumopericardium and pneumomediastinum with large amount of soft tissue emphysema at the neck Electronically Signed   By: Jasmine Pang M.D.   On: 04/26/2017 21:19   Dg Chest Port 1 View  Result Date: 04/26/2017 CLINICAL DATA:  ETT placement EXAM: PORTABLE CHEST 1 VIEW COMPARISON:  04/26/2017 FINDINGS: Endotracheal tube tip is about 1 cm superior to the carina. Esophageal tube appears looped back upon itself, side-port overlies the upper esophagus. No consolidation or pleural effusion. Pneumomediastinum with subcutaneous emphysema in the supraclavicular fossa. No discrete pneumothorax is seen. IMPRESSION: 1. Endotracheal tube tip about 1 cm superior to carina 2. Esophageal tube is looped upon itself and the side port appears to be positioned over the upper mediastinum/esophagus. Repositioning recommended 3. Pneumomediastinum with moderate subcutaneous emphysema in the neck and supraclavicular  fossa as before Electronically Signed   By: Jasmine Pang M.D.   On: 04/26/2017 19:47   Dg Chest Port 1 View  Result Date: 04/26/2017 CLINICAL DATA:  Leukocytosis EXAM: PORTABLE CHEST 1 VIEW COMPARISON:  None. FINDINGS: Pneumomediastinum with soft tissue emphysema extending into the neck. No pneumothorax. No pleural effusion or focal consolidation. Normal cardiomediastinal silhouette. No acute osseous abnormality. IMPRESSION: Pneumomediastinum and soft tissue emphysema extending into the neck soft tissues. Differential considerations include perforated esophagus versus tracheobronchial injury versus penetrating injury. Critical Value/emergent results were called by telephone at the time of interpretation on 04/26/2017 at 6:39pm to Dr. Chaney Malling , who verbally acknowledged these results. Electronically Signed   By: Elige Ko   On: 04/26/2017 18:41   US Abdomen Limited Ruq  Result Date: 04/26/2017 CLINICAL DATA:  Increased LFTs EXAM: ULTRASOUND ABDOMEN LIMITED RIGHT UPPER QUADRANT COMPARISON:  None. FINDINGS: Gallbladder: No gallstones or wall thickening visualized. No sonographic Murphy sign noted by sonographer. Common bile duct: Diameter: 4.5 mm Liver: No focal lesion identified. Increased hepatic parenchymal echogenicity. Portal vein is patent on color Doppler imaging with normal direction of blood flow towards the liver. IMPRESSION: 1. No cholelithiasis or sonographic evidence of acute cholecystitis. 2. Increased hepatic echogenicity as can be seen with hepatic steatosis. Electronically Signed   By: Elige Ko   On: 04/26/2017 19:17    ASSESSMENT / PLAN: NEURO: Altered mental status - h/o Bipolar Type 1 Disease, Aggressive behavior reported  Significant Agitation and combative behavior requiring sedation  H/o supratherapeutic levels of Lithium then cessation of medication-remain off lithium  Checked Lithium levels on presentation 0.85 normal  Pt remains intubated and sedated, wean sedation as  able    CARDIAC: Pneumopericardium Not currently on Vasopressors MAP goal>54mmHg Consult placed to CTS- recommended transfer then esophogram in AM Elevated Lactic-trending down  Monitor QTc (baseline 463)-> check EKG today .  Avoid Qt prolonging meds   PULMONARY: Respiratory Failure on Vent support  Pneumomediastinum- etiology  unknown Will r/o esophageal injury (tear/rupture)- Esophogram pending  CTS to see  PEEP 0 to limit worsening of pneumomediastinum with excessive positive pressure.  O2 sat >92% acceptable    ID: Leukocytosis  No fevers no hemodynamic instability Given Vancomycin and Zosyn in the ED empirically, cont Zosyn for now . Await esophagram results  Follow cx data  Off Vancomycin given AKI  PCT neg .  Trend WBC, fever curve, lactate  Endocrine: Check TSH No H/o insulin dependence Add SSI if BS tr up on chem   GI: R/O Esophageal injury Elevated Transaminases Esophogram pend  Keep pt NPO Started on PPI  Heme: No active bleeding or coagulopathy Started on DVT PPX  ( SCDs and Heparin)  RENAL Anion Gap Metabolic Acidosis Acute Kidney injury  Elevated Lactate > tr down  Baseline Cr at 1.22 now 2.3> tr down 1.5  H/o elevated Lithium levels 3-4 days>normal on admission   cont IVF w/ NS at 100cc/hr  Strict I/O  .Marland Kitchen Lab Results  Component Value Date   CREATININE 1.59 (H) 04/27/2017   CREATININE 2.20 (H) 04/26/2017   CREATININE 2.30 (H) 04/26/2017    .   CODE STATUS: Full PROGNOSIS: Guarded FAMILY: not at bedside. Accompanied by officers from [facility] NP-C  [facility]  [phone]   04/27/2017, 8:26 AM

## 2017-04-27 NOTE — Progress Notes (Signed)
  Subjective:    Patient presented with delerium and pneumomediastinum Now sedated on vent CXR stable - mild subQ air in neck Rec eval of esophagus with CT tube study and contrast per tube Source of air probably from lungs Follow with daily pcxr    Objective: Vital signs in last 24 hours: Temp:  [97.5 F (36.4 C)-98.9 F (37.2 C)] 98.5 F (36.9 C) (03/30 0853) Pulse Rate:  [62-112] 62 (03/30 0800) Cardiac Rhythm: Normal sinus rhythm (03/30 0800) Resp:  [12-22] 16 (03/30 0800) BP: (97-147)/(45-106) 98/54 (03/30 0800) SpO2:  [96 %-100 %] 100 % (03/30 0800) FiO2 (%):  [40 %] 40 % (03/30 0800) Weight:  [220 lb 7.4 oz (100 kg)-285 lb 11.5 oz (129.6 kg)] 285 lb 11.5 oz (129.6 kg) (03/30 0300)  Hemodynamic parameters for last 24 hours:    Intake/Output from previous day: 03/29 0701 - 03/30 0700 In: 2841.7 [I.V.:541.7; IV Piggyback:2300] Out: 400 [Urine:400] Intake/Output this shift: Total I/O In: 66.5 [I.V.:66.5] Out: 50 [Urine:50]  Lungs clear NSR no friction rub  Lab Results: Recent Labs    04/26/17 1527 04/26/17 1607 04/27/17 0227  WBC 19.8*  --  16.8*  HGB 14.7 15.3 12.6*  HCT 41.6 45.0 36.9*  PLT 181  --  149*   BMET:  Recent Labs    04/26/17 1527 04/26/17 1607 04/27/17 0227  NA 141 142 141  K 3.7 3.6 3.5  CL 100* 102 106  CO2 20*  --  21*  GLUCOSE 117* 112* 116*  BUN 37* 33* 23*  CREATININE 2.30* 2.20* 1.59*  CALCIUM 9.5  --  8.6*    PT/INR: No results for input(s): LABPROT, INR in the last 72 hours. ABG    Component Value Date/Time   PHART 7.343 (L) 04/26/2017 2133   HCO3 21.6 04/26/2017 2133   TCO2 23 04/26/2017 1607   ACIDBASEDEF 3.5 (H) 04/26/2017 2133   O2SAT 99.4 04/26/2017 2133   CBG (last 3)  No results for input(s): GLUCAP in the last 72 hours.  Assessment/Plan: S/P  Cover with antibiotics CT tube study to eval esophagus   LOS: 1 day    Brandon Fuentes 04/27/2017

## 2017-04-27 NOTE — ED Notes (Signed)
[facility] at bedside to transfer patient °

## 2017-04-27 NOTE — Plan of Care (Signed)
°  Problem: Coping: °Goal: Level of anxiety will decrease °Outcome: Progressing °  °

## 2017-04-27 NOTE — ED Notes (Signed)
[facility] ETA 10 minutes 

## 2017-04-27 NOTE — ED Provider Notes (Signed)
Medical screening examination/treatment/procedure(s) were conducted as a shared visit with non-physician practitioner(s) and myself.  I personally evaluated the patient during the encounter.  None  Brandon Fuentes is a 27 y.o. male hx of bipolar, psychosis, here with AMS, elevated lithium level. He is from jail and had elevated lithium level several days ago and is now off lithium. He was reported to be altered and defecated on himself and was screaming and cursing. Patient unable to give meaningful history and is agitated and hand cuff to the bed and accompanied by police. He is unable to tell me why he is here. He is slightly tachycardic on arrival, normal oxygen level. He is moving all extremities. Difficult to appreciate breath sounds due to body habitus ? Crepitance on the chest. No obvious bruising on the chest or neck area. Abdomen nontender. Labs showed WBC 19, lactate 4. Also LFTs elevated. RUQ US unremarkable, hepatitis panel sent. Also has mild AKI with Cr 2.2 with baseline 1.3. We consulted nephrology regarding AKI and nephrologist thinks its likely from dehydration instead of lithium toxicity (lithium level in the therapeutic range in the ED). Sepsis workup initiated since he meets SIRS criteria. CXR showed pneumomediastinum. He required multiple sedating meds in order to get CXR. He became more altered as a result of it. He is intubated for altered mental status, airway protection, and sedation for CT chest/ab/pel to further assess the cause of pneumomediastinum. I intubated patient successfully, confirmed by CXR. I placed NG tube but it was coiled so I removed it. CT confirmed pneumomediastium and also showed pneumopericardium. I called Dr. Donata Clay from CT surgery. He thinks this is likely from esophageal rupture vs leak from bronchus and doesn't recommend emergent surgery but patient does need esophagram to further assess. Of note, patient was agitated in CT and required additional medicines and  ET tube was pushed into the right mainstem. I was able to pull it out 2 cm and tube position was again confirmed by CXR after patient came back from CT. I consulted critical care to admit, they recommend no PEEP as to not make the pneumomediastinum worse. He was given vanc/zosyn empirically. Remained in critical condition.   CRITICAL CARE Performed by: Richardean Canal   Total critical care time:60 minutes  Critical care time was exclusive of separately billable procedures and treating other patients.  Critical care was necessary to treat or prevent imminent or life-threatening deterioration.  Critical care was time spent personally by me on the following activities: development of treatment plan with patient and/or surrogate as well as nursing, discussions with consultants, evaluation of patient's response to treatment, examination of patient, obtaining history from patient or surrogate, ordering and performing treatments and interventions, ordering and review of laboratory studies, ordering and review of radiographic studies, pulse oximetry and re-evaluation of patient's condition.  INTUBATION Performed by: Richardean Canal  Required items: required blood products, implants, devices, and special equipment available Patient identity confirmed: provided demographic data and hospital-assigned identification number Time out: Immediately prior to procedure a "time out" was called to verify the correct patient, procedure, equipment, support staff and site/side marked as required.  Indications: altered mental status, lethargy, airway protection, sedation for procedure  Intubation method: Glidescope Laryngoscopy   Preoxygenation: BVM  Sedatives: 40 mg Etomidate Paralytic: 120 mg Succinylcholine  Tube Size: 8.0 cuffed  Post-procedure assessment: chest rise and ETCO2 monitor Breath sounds: equal and absent over the epigastrium Tube secured with: ETT holder Chest x-ray interpreted by radiologist and  me.  Chest x-ray findings: endotracheal tube in appropriate position  Patient tolerated the procedure well with no immediate complications.  NG tube I attempted 7418 F NG tube. It was coiled in the esophagus and was subsequently removed.       Charlynne PanderYao, Jackquline Branca Hsienta, MD 04/27/17 [phone]

## 2017-04-27 NOTE — Progress Notes (Signed)
Initial Nutrition Assessment  DOCUMENTATION CODES:   Obesity unspecified  INTERVENTION:   As able to feed recommend Vital High Protein @ 55 ml/hr (1320 ml/day) 60 ml Prostat BID  Provides: 1720 kcal, 175 grams protein, and 1103 ml free water.    NUTRITION DIAGNOSIS:   Inadequate oral intake related to inability to eat as evidenced by NPO status.  GOAL:   Provide needs based on ASPEN/SCCM guidelines  MONITOR:   Vent status, I & O's  REASON FOR ASSESSMENT:   Ventilator    ASSESSMENT:   Pt with PMH of Bipolar type 1 disease with recent cessation of Lithium (levels WNL on admission) admitted from jail with pneumomediastinum and pneumopericardium.     Pt intubated, followed by CTS.  Spoke with RN. Per RN plan for CT today unsure of time.  RN was able to place OG tube into stomach which is now to LIWS.  Per RN pt was very violent at the jail. Pt remains in police custody in handcuffs on fentanyl/versed sedation  Patient is currently intubated on ventilator support  Medications and labs reviewed  No pressors MAP (cuff):  60 No feeding acces currently  NUTRITION - FOCUSED PHYSICAL EXAM:    Most Recent Value  Orbital Region  No depletion  Upper Arm Region  No depletion  Thoracic and Lumbar Region  No depletion  Buccal Region  No depletion  Temple Region  No depletion  Clavicle Bone Region  No depletion  Clavicle and Acromion Bone Region  No depletion  Scapular Bone Region  Unable to assess  Dorsal Hand  No depletion  Patellar Region  No depletion  Anterior Thigh Region  No depletion  Posterior Calf Region  No depletion  Edema (RD Assessment)  None  Hair  Reviewed  Eyes  Unable to assess  Mouth  Unable to assess  Skin  Reviewed  Nails  Reviewed       Diet Order:  No diet orders on file  EDUCATION NEEDS:   No education needs have been identified at this time  Skin:  Skin Assessment: Reviewed RN Assessment  Last BM:  unknown  Height:   Ht  Readings from Last 1 Encounters:  04/27/17 6\' 1"  (1.854 m)    Weight:   Wt Readings from Last 1 Encounters:  04/27/17 285 lb 11.5 oz (129.6 kg)    Ideal Body Weight:  83.6 kg  BMI:  Body mass index is 37.7 kg/m.  Estimated Nutritional Needs:   Kcal:  [id number]  Protein:  >/= 167 grams  Fluid:  2 L/day  Kendell BaneHeather Filbert Craze RD, LDN, CNSC [phone] Pager [phone] After Hours Pager

## 2017-04-28 ENCOUNTER — Inpatient Hospital Stay (HOSPITAL_COMMUNITY): Payer: 59

## 2017-04-28 DIAGNOSIS — J9690 Respiratory failure, unspecified, unspecified whether with hypoxia or hypercapnia: Secondary | ICD-10-CM

## 2017-04-28 LAB — COMPREHENSIVE METABOLIC PANEL
ALBUMIN: 3.3 g/dL — AB (ref 3.5–5.0)
ALT: 85 U/L — ABNORMAL HIGH (ref 17–63)
ANION GAP: 11 (ref 5–15)
AST: 140 U/L — ABNORMAL HIGH (ref 15–41)
Alkaline Phosphatase: 47 U/L (ref 38–126)
BILIRUBIN TOTAL: 1.5 mg/dL — AB (ref 0.3–1.2)
BUN: 14 mg/dL (ref 6–20)
CO2: 24 mmol/L (ref 22–32)
Calcium: 8.7 mg/dL — ABNORMAL LOW (ref 8.9–10.3)
Chloride: 109 mmol/L (ref 101–111)
Creatinine, Ser: 1.23 mg/dL (ref 0.61–1.24)
GFR calc Af Amer: >60 mL/min (ref >60)
Glucose, Bld: 100 mg/dL — ABNORMAL HIGH (ref 65–99)
POTASSIUM: 3.4 mmol/L — AB (ref 3.5–5.1)
Sodium: 144 mmol/L (ref 135–145)
TOTAL PROTEIN: 5.9 g/dL — AB (ref 6.5–8.1)

## 2017-04-28 LAB — CK: Total CK: 5459 U/L — ABNORMAL HIGH (ref 49–397)

## 2017-04-28 LAB — URINE CULTURE: Culture: NO GROWTH

## 2017-04-28 LAB — CBC
HEMATOCRIT: 36.2 % — AB (ref 39.0–52.0)
HEMOGLOBIN: 12.1 g/dL — AB (ref 13.0–17.0)
MCH: 25.3 pg — ABNORMAL LOW (ref 26.0–34.0)
MCHC: 33.4 g/dL (ref 30.0–36.0)
MCV: 75.7 fL — ABNORMAL LOW (ref 78.0–100.0)
Platelets: 134 10*3/uL — ABNORMAL LOW (ref 150–400)
RBC: 4.78 MIL/uL (ref 4.22–5.81)
RDW: 14 % (ref 11.5–15.5)
WBC: 12 10*3/uL — AB (ref 4.0–10.5)

## 2017-04-28 LAB — GLUCOSE, CAPILLARY
Glucose-Capillary: 81 mg/dL (ref 65–99)
Glucose-Capillary: 88 mg/dL (ref 65–99)
Glucose-Capillary: 92 mg/dL (ref 65–99)
Glucose-Capillary: 93 mg/dL (ref 65–99)

## 2017-04-28 LAB — LIPASE, BLOOD: LIPASE: 37 U/L (ref 11–51)

## 2017-04-28 LAB — MAGNESIUM
MAGNESIUM: 2.5 mg/dL — AB (ref 1.7–2.4)
MAGNESIUM: 2.4 mg/dL (ref 1.7–2.4)

## 2017-04-28 LAB — PHOSPHORUS
PHOSPHORUS: 3.9 mg/dL (ref 2.5–4.6)
PHOSPHORUS: 3.9 mg/dL (ref 2.5–4.6)

## 2017-04-28 LAB — LACTIC ACID, PLASMA: Lactic Acid, Venous: 1.2 mmol/L (ref 0.5–1.9)

## 2017-04-28 LAB — PROCALCITONIN: Procalcitonin: <0.1 ng/mL

## 2017-04-28 LAB — AMYLASE: AMYLASE: 54 U/L (ref 28–100)

## 2017-04-28 MED ORDER — VITAL HIGH PROTEIN PO LIQD
1000.0000 mL | ORAL | Status: DC
  Administered 2017-04-28 – 2017-04-29 (×2): 1000 mL

## 2017-04-28 MED ORDER — POTASSIUM CHLORIDE 10 MEQ/100ML IV SOLN
10.0000 meq | INTRAVENOUS | Status: AC
  Administered 2017-04-28 (×2): 10 meq via INTRAVENOUS
  Filled 2017-04-28 (×2): qty 100

## 2017-04-28 MED ORDER — PRO-STAT SUGAR FREE PO LIQD
60.0000 mL | Freq: Two times a day (BID) | ORAL | Status: DC
  Administered 2017-04-28 – 2017-05-08 (×9): 60 mL
  Filled 2017-04-28 (×19): qty 60

## 2017-04-28 MED ORDER — ORAL CARE MOUTH RINSE
15.0000 mL | Freq: Four times a day (QID) | OROMUCOSAL | Status: DC
  Administered 2017-04-28 – 2017-05-09 (×32): 15 mL via OROMUCOSAL

## 2017-04-28 MED ORDER — ORAL CARE MOUTH RINSE: 15.0000 mL | Freq: Four times a day (QID) | OROMUCOSAL | Status: DC

## 2017-04-28 MED ORDER — CHLORHEXIDINE GLUCONATE 0.12% ORAL RINSE (MEDLINE KIT)
15.0000 mL | Freq: Two times a day (BID) | OROMUCOSAL | Status: DC
  Administered 2017-04-28 (×3): 15 mL via OROMUCOSAL

## 2017-04-28 MED ORDER — CHLORHEXIDINE GLUCONATE 0.12% ORAL RINSE (MEDLINE KIT)
15.0000 mL | Freq: Two times a day (BID) | OROMUCOSAL | Status: DC
  Administered 2017-04-29 – 2017-05-04 (×11): 15 mL via OROMUCOSAL

## 2017-04-28 NOTE — Progress Notes (Signed)
eLink Physician-Brief Progress Note Patient Name: Brandon AuerSherwood Sesay DOB: [birth year] MRN: [id number]   Date of Service  04/28/2017  HPI/Events of Note  Agitation - Request to renew restraint orders.   eICU Interventions  Will renew restraint orders.      Intervention Category Minor Interventions: Agitation / anxiety - evaluation and management  Sommer,Steven Eugene 04/28/2017, 2:59 AM

## 2017-04-28 NOTE — Progress Notes (Signed)
eLink Physician-Brief Progress Note Patient Name: Brandon Fuentes DOB: [birth year] MRN: [id number]   Date of Service  04/28/2017  HPI/Events of Note  Agitation - Request to renew soft bilateral wrist restraints.   eICU Interventions  Will renew soft bilateral wrist restraints.      Intervention Category Minor Interventions: Agitation / anxiety - evaluation and management  Leelan Rajewski Eugene 04/28/2017, 10:24 PM

## 2017-04-28 NOTE — Progress Notes (Signed)
.. ..  Name: Brandon Fuentes MRN: [id number] DOB: 1990-09-26    ADMISSION DATE:  04/26/2017 CONSULTATION DATE:  04/26/17  REFERRING MD :  ED PHYSICIAN- [facility]  CHIEF COMPLAINT:  Agitation and combative  BRIEF PATIENT DESCRIPTION: 27 yr old Bipolar pt intubated found to have pneumomediastinum and pneumopericardium PCCM consulted for admission.  SIGNIFICANT EVENTS  intubated  STUDIES:  3/29 Labs:AGMA, lactic acid 4.25, UDS negative, BUN/Cr 33/2.2 (elevated from baseline)  CT official read: Extensive pneumomediastinum and pneumopericardium is noted. Extensive soft tissue gas is seen in the supraclavicular and cervical soft tissues.  This is concerning for rupture of bronchus or esophagus.  Endotracheal tube is directed into right mainstem bronchus; withdrawal by 3-4 cm is recommended.    HISTORY OF PRESENT ILLNESS:  (Pt intubated, history acquired from other care providers including RN and Triage notes)  27 yr old male with Bipolar Type 1 disorder [facility] with a recent history of high lithium levels. The medication was discontinued and he has now received no therapy for the last 3-4 days. Combative at the jail brought in for medical clearance. In the ER the pt was combative, screaming, cursing and not answering questions appropiately.  He received  Imaging showed a Pneumomediastinum with Pneumopericardium and ETT in the Right mainstem bronchus. PCCM consulted for admission.  Upon my evaluation of the chart, objective information and the patient, a consult was placed to CTS due to the fact that no clear etiology for the pneumomediastinum.  CTS recommended transfer to [facility] for management.   SUBJECTIVE:  Pt remains intubated on vent .  Sedation decreased this am for wake up assessment , pt with increased alertness, f/c simple commands .  SQ air is decreased and periorbital edema resolved.  CT chest yesterday with NG contrast neg for esophageal perforation  Officers at  bedside , hand restraints and feet cuffs in place     VITAL SIGNS: Temp:  [97.5 F (36.4 C)-98.5 F (36.9 C)] 98.1 F (36.7 C) (03/31 0335) Pulse Rate:  [50-121] 52 (03/31 0800) Resp:  [16-20] 18 (03/31 0800) BP: (94-143)/(47-86) 120/61 (03/31 0800) SpO2:  [96 %-100 %] 100 % (03/31 0800) FiO2 (%):  [30 %-40 %] 30 % (03/31 0759) Weight:  [292 lb 12.3 oz (132.8 kg)] 292 lb 12.3 oz (132.8 kg) (03/31 0335)  PHYSICAL EXAMINATION: General: Intubated on vent  Neuro:  Sedated on vent , alert on WUA , followed simple commands.  HEENT:  ETT in place , MIn sq air along neck ,   Cardiovascular:  RRR , NSR  Lungs:  CTA ., no wheezing  Abdomen:  Soft , + BS  Musculoskeletal:  Intact, wrist restraints , chain restraints on LE .  Skin: no skin breakdown   Recent Labs  Lab 04/26/17 1527 04/26/17 1607 04/27/17 0227 04/28/17 0225  NA 141 142 141 144  K 3.7 3.6 3.5 3.4*  CL 100* 102 106 109  CO2 20*  --  21* 24  BUN 37* 33* 23* 14  CREATININE 2.30* 2.20* 1.59* 1.23  GLUCOSE 117* 112* 116* 100*   Recent Labs  Lab 04/26/17 1527 04/26/17 1607 04/27/17 0227 04/28/17 0225  HGB 14.7 15.3 12.6* 12.1*  HCT 41.6 45.0 36.9* 36.2*  WBC 19.8*  --  16.8* 12.0*  PLT 181  --  149* 134*   Ct Head Wo Contrast  Result Date: 04/26/2017 CLINICAL DATA:  Altered mental status EXAM: CT HEAD WITHOUT CONTRAST TECHNIQUE: Contiguous axial images were obtained from  the base of the skull through the vertex without intravenous contrast. COMPARISON:  None. FINDINGS: Brain: No mass lesion, intraparenchymal hemorrhage or extra-axial collection. No evidence of acute cortical infarct. Normal appearance of the brain parenchyma and extra axial spaces for age. Vascular: No hyperdense vessel or unexpected vascular calcification. 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  Normal calvarium. Sinuses/Orbits: No sinus fluid levels or advanced mucosal thickening. No mastoid effusion. Normal orbits. IMPRESSION: Normal  brain. Electronically Signed   By: Deatra RobinsonKevin  Herman M.D.   On: 04/26/2017 21:02   Ct Soft Tissue Neck W Contrast  Result Date: 04/26/2017 CLINICAL DATA:  Vomiting around endotracheal tube. EXAM: CT NECK WITH CONTRAST TECHNIQUE: Multidetector CT imaging of the neck was performed using the standard protocol following the bolus administration of intravenous contrast. CONTRAST:  80mL ISOVUE-300 IOPAMIDOL (ISOVUE-300) INJECTION 61% COMPARISON:  None. FINDINGS: PHARYNX AND LARYNX: The endotracheal tube tip is in the proximal right mainstem bronchus. Nasopharyngeal opacification is common in the setting of endotracheal intubation. Nasogastric tube courses below the field of view. No focal laryngeal or pharyngeal abnormality otherwise. SALIVARY GLANDS: --Parotid: No mass lesion or inflammation. No sialolithiasis or ductal dilatation. Multifocal free air in the carotid spaces. --Submandibular: Symmetric without inflammation. No sialolithiasis or ductal dilatation. Multifocal free air in both submandibular spaces. --Sublingual: Normal. No ranula or other visible lesion of the base of tongue and floor of mouth. THYROID: Normal. LYMPH NODES: No enlarged or abnormal density lymph nodes. VASCULAR: Major cervical vessels are patent. LIMITED INTRACRANIAL: Normal. VISUALIZED ORBITS: Normal. MASTOIDS AND VISUALIZED PARANASAL SINUSES: No fluid levels or advanced mucosal thickening. No mastoid effusion. SKELETON: No bony spinal canal stenosis. No lytic or blastic lesions. UPPER CHEST: There is a small focal defect of the posterior trachea just below the thoracic inlet (series 4, image 102, 105). OTHER: There is extensive emphysema throughout the deep spaces of the neck. IMPRESSION: 1. Small defect of the posterior aspect of the trachea just below the level of the thoracic inlet may indicate a small tracheal perforation, which is a potential source for the extensive pneumomediastinum and emphysema of the deep spaces of the neck. If  there is no history of trauma, however, an esophageal perforation is probably still more likely, since the pneumomediastinum was present prior to intubation. 2. Endotracheal tube tip is in the proximal right mainstem bronchus. Electronically Signed   By: Deatra RobinsonKevin  Herman M.D.   On: 04/26/2017 20:59   Ct Chest W Contrast  Result Date: 04/27/2017 CLINICAL DATA:  Evaluate for esophageal injury.  Pneumomediastinum. EXAM: CT CHEST WITH CONTRAST TECHNIQUE: Multidetector CT imaging of the chest was performed during intravenous contrast administration. CONTRAST:  75mL ISOVUE-300 IOPAMIDOL (ISOVUE-300) INJECTION 61% COMPARISON:  CT chest 04/26/2017 FINDINGS: Cardiovascular: No significant vascular findings. Normal heart size. No pericardial effusion. Mediastinum/Nodes: No enlarged mediastinal, hilar, or axillary lymph nodes. Thyroid gland and trachea are normal. Endotracheal tube in satisfactory position. Nasogastric tube with the tip terminating in the mid esophagus. Oral contrast was hand injected through the nasogastric tube. No contrast within the esophagus. Contrast is present within the stomach. No extraluminal contrast in the mediastinum. Pneumomediastinum and pneumopericardium with extension into the neck soft tissue. Lungs/Pleura: Bibasilar atelectasis. No pleural effusion or pneumothorax. No pneumothorax. Upper Abdomen: No acute abnormality. Musculoskeletal: No acute osseous abnormality. No aggressive osseous lesion. IMPRESSION: 1. Pneumomediastinum and pneumopericardium with extension into the neck soft tissue. Nasogastric tube with the tip terminating in the mid esophagus. Oral contrast was hand injected through the nasogastric  tube. No contrast within the esophagus. Contrast is present within the stomach. No extraluminal contrast in the mediastinum to suggest perforation. The upper esophagus is suboptimally evaluated given that the nasogastric tube terminates in the mid esophagus. Electronically Signed   By:  Elige Ko   On: 04/27/2017 14:52   Ct Chest W Contrast  Result Date: 04/26/2017 CLINICAL DATA:  Generalized abdominal pain, elevated liver function tests. Pneumomediastinum. EXAM: CT CHEST, ABDOMEN, AND PELVIS WITH CONTRAST TECHNIQUE: Multidetector CT imaging of the chest, abdomen and pelvis was performed following the standard protocol during bolus administration of intravenous contrast. However, arms could not be brought above the patient's head, resulting in artifact. CONTRAST:  80mL ISOVUE-300 IOPAMIDOL (ISOVUE-300) INJECTION 61% COMPARISON:  Radiograph of same day. FINDINGS: CT CHEST FINDINGS Cardiovascular: No significant vascular findings. Normal heart size. No pericardial effusion. Mediastinum/Nodes: Endotracheal tube seen directed into right mainstem bronchus. Nasogastric tube is seen going down the esophagus, which is looped in the stomach and distal tip is seen extending back to proximal esophagus. Extensive pneumomediastinum is noted as well as pneumopericardium. Extensive soft tissue gas is seen in the supraclavicular region and cervical region. Lungs/Pleura: No pneumothorax or pleural effusion is noted. Lungs are clear. Musculoskeletal: No chest wall mass or suspicious bone lesions identified. CT ABDOMEN PELVIS FINDINGS Hepatobiliary: No focal liver abnormality is seen. No gallstones, gallbladder wall thickening, or biliary dilatation. Pancreas: Unremarkable. No pancreatic ductal dilatation or surrounding inflammatory changes. Spleen: Normal in size without focal abnormality. Adrenals/Urinary Tract: Adrenal glands are unremarkable. Kidneys are normal, without renal calculi, focal lesion, or hydronephrosis. Bladder is unremarkable. Stomach/Bowel: Stomach is within normal limits. Appendix appears normal. No evidence of bowel wall thickening, distention, or inflammatory changes. Vascular/Lymphatic: No significant vascular findings are present. No enlarged abdominal or pelvic lymph nodes.  Reproductive: Prostate is unremarkable. Other: No abdominal wall hernia or abnormality. No abdominopelvic ascites. Musculoskeletal: No acute or significant osseous findings. IMPRESSION: Extensive pneumomediastinum and pneumopericardium is noted. Extensive soft tissue gas is seen in the supraclavicular and cervical soft tissues. This is concerning for rupture of bronchus or esophagus. Endotracheal tube is directed into right mainstem bronchus; withdrawal by 3-4 cm is recommended. Critical Value/emergent results were called by telephone at the time of interpretation on 04/26/2017 at 9:03 pm to Dr. Chaney Malling , who verbally acknowledged these results. Nasogastric tube is seen looped within the stomach, with distal tip directed back into the esophagus and positioned within proximal esophagus. No definite abnormality seen in the abdomen or pelvis. Electronically Signed   By: Lupita Raider, M.D.   On: 04/26/2017 21:05   Ct Abdomen Pelvis W Contrast  Result Date: 04/26/2017 CLINICAL DATA:  Generalized abdominal pain, elevated liver function tests. Pneumomediastinum. EXAM: CT CHEST, ABDOMEN, AND PELVIS WITH CONTRAST TECHNIQUE: Multidetector CT imaging of the chest, abdomen and pelvis was performed following the standard protocol during bolus administration of intravenous contrast. However, arms could not be brought above the patient's head, resulting in artifact. CONTRAST:  80mL ISOVUE-300 IOPAMIDOL (ISOVUE-300) INJECTION 61% COMPARISON:  Radiograph of same day. FINDINGS: CT CHEST FINDINGS Cardiovascular: No significant vascular findings. Normal heart size. No pericardial effusion. Mediastinum/Nodes: Endotracheal tube seen directed into right mainstem bronchus. Nasogastric tube is seen going down the esophagus, which is looped in the stomach and distal tip is seen extending back to proximal esophagus. Extensive pneumomediastinum is noted as well as pneumopericardium. Extensive soft tissue gas is seen in the  supraclavicular region and cervical region. Lungs/Pleura: No pneumothorax or pleural effusion is  noted. Lungs are clear. Musculoskeletal: No chest wall mass or suspicious bone lesions identified. CT ABDOMEN PELVIS FINDINGS Hepatobiliary: No focal liver abnormality is seen. No gallstones, gallbladder wall thickening, or biliary dilatation. Pancreas: Unremarkable. No pancreatic ductal dilatation or surrounding inflammatory changes. Spleen: Normal in size without focal abnormality. Adrenals/Urinary Tract: Adrenal glands are unremarkable. Kidneys are normal, without renal calculi, focal lesion, or hydronephrosis. Bladder is unremarkable. Stomach/Bowel: Stomach is within normal limits. Appendix appears normal. No evidence of bowel wall thickening, distention, or inflammatory changes. Vascular/Lymphatic: No significant vascular findings are present. No enlarged abdominal or pelvic lymph nodes. Reproductive: Prostate is unremarkable. Other: No abdominal wall hernia or abnormality. No abdominopelvic ascites. Musculoskeletal: No acute or significant osseous findings. IMPRESSION: Extensive pneumomediastinum and pneumopericardium is noted. Extensive soft tissue gas is seen in the supraclavicular and cervical soft tissues. This is concerning for rupture of bronchus or esophagus. Endotracheal tube is directed into right mainstem bronchus; withdrawal by 3-4 cm is recommended. Critical Value/emergent results were called by telephone at the time of interpretation on 04/26/2017 at 9:03 pm to Dr. Chaney Malling , who verbally acknowledged these results. Nasogastric tube is seen looped within the stomach, with distal tip directed back into the esophagus and positioned within proximal esophagus. No definite abnormality seen in the abdomen or pelvis. Electronically Signed   By: Lupita Raider, M.D.   On: 04/26/2017 21:05   Dg Chest Port 1 View  Result Date: 04/27/2017 CLINICAL DATA:  Ventilator dependent. EXAM: PORTABLE CHEST 1 VIEW  COMPARISON:  04/26/2017 FINDINGS: Endotracheal tube in satisfactory position. Cardiomediastinal silhouette is normal. Mediastinal contours appear intact. Persistent pneumopericardium and pneumomediastinum. Osseous structures are without acute abnormality. Soft tissue emphysema tracking to the neck. IMPRESSION: Persistent pneumopericardium and pneumomediastinum. Soft tissue emphysema tracking to the neck. Electronically Signed   By: Ted Mcalpine M.D.   On: 04/27/2017 02:57   Dg Chest Port 1 View  Result Date: 04/26/2017 CLINICAL DATA:  ETT placement EXAM: PORTABLE CHEST 1 VIEW COMPARISON:  CT 04/26/2017, chest x-ray 04/26/2017 FINDINGS: Endotracheal tube tip is about 2.4 cm superior to the carina. Esophageal tube has been removed. Pneumopericardium and pneumomediastinum with subcutaneous emphysema in the neck. No consolidation or effusion. Stable cardiomediastinal silhouette IMPRESSION: 1. Endotracheal tube tip is about 2.4 cm superior to the carina 2. Removal of esophageal tube 3. Pneumopericardium and pneumomediastinum with large amount of soft tissue emphysema at the neck Electronically Signed   By: Jasmine Pang M.D.   On: 04/26/2017 21:19   Dg Chest Port 1 View  Result Date: 04/26/2017 CLINICAL DATA:  ETT placement EXAM: PORTABLE CHEST 1 VIEW COMPARISON:  04/26/2017 FINDINGS: Endotracheal tube tip is about 1 cm superior to the carina. Esophageal tube appears looped back upon itself, side-port overlies the upper esophagus. No consolidation or pleural effusion. Pneumomediastinum with subcutaneous emphysema in the supraclavicular fossa. No discrete pneumothorax is seen. IMPRESSION: 1. Endotracheal tube tip about 1 cm superior to carina 2. Esophageal tube is looped upon itself and the side port appears to be positioned over the upper mediastinum/esophagus. Repositioning recommended 3. Pneumomediastinum with moderate subcutaneous emphysema in the neck and supraclavicular fossa as before Electronically  Signed   By: Jasmine Pang M.D.   On: 04/26/2017 19:47   Dg Chest Port 1 View  Result Date: 04/26/2017 CLINICAL DATA:  Leukocytosis EXAM: PORTABLE CHEST 1 VIEW COMPARISON:  None. FINDINGS: Pneumomediastinum with soft tissue emphysema extending into the neck. No pneumothorax. No pleural effusion or focal consolidation. Normal cardiomediastinal silhouette. No acute  osseous abnormality. IMPRESSION: Pneumomediastinum and soft tissue emphysema extending into the neck soft tissues. Differential considerations include perforated esophagus versus tracheobronchial injury versus penetrating injury. Critical Value/emergent results were called by telephone at the time of interpretation on 04/26/2017 at 6:39pm to Dr. Chaney Malling , who verbally acknowledged these results. Electronically Signed   By: Elige Ko   On: 04/26/2017 18:41   US Abdomen Limited Ruq  Result Date: 04/26/2017 CLINICAL DATA:  Increased LFTs EXAM: ULTRASOUND ABDOMEN LIMITED RIGHT UPPER QUADRANT COMPARISON:  None. FINDINGS: Gallbladder: No gallstones or wall thickening visualized. No sonographic Murphy sign noted by sonographer. Common bile duct: Diameter: 4.5 mm Liver: No focal lesion identified. Increased hepatic parenchymal echogenicity. Portal vein is patent on color Doppler imaging with normal direction of blood flow towards the liver. IMPRESSION: 1. No cholelithiasis or sonographic evidence of acute cholecystitis. 2. Increased hepatic echogenicity as can be seen with hepatic steatosis. Electronically Signed   By: Elige Ko   On: 04/26/2017 19:17    ASSESSMENT / PLAN: NEURO: Altered mental status - h/o Bipolar Type 1 Disease, Aggressive behavior reported  Significant Agitation and combative behavior requiring sedation  H/o supratherapeutic levels of Lithium then cessation of medication-remain off lithium   Lithium levels on presentation 0.85 normal  Pt remains intubated and sedated, wean sedation as able . Will need to be careful as pt  very violent on presentation  ? May need to start slowly adding back Psych meds (was on remeron, trileptal , symmetrel, depakote, and abilify )   CARDIAC: Pneumopericardium Not currently on Vasopressors MAP goal>36mmHg CTS following  Elevated Lactic-LA has cleared  Monitor QTc (baseline 463)  Avoid Qt prolonging meds   PULMONARY: Respiratory Failure on Vent support  Pneumomediastinum- etiology unknown No esophageal injury (tear/rupture) on CT w/ NG contrast  CTS following  Cont vent setting w/ PEEP 0 to limit worsening of pneumomediastinum with excessive positive pressure.  CXR 3/31 is stable , visible sq air is decreased . WUA with improved alertness If able to wean sedation /mental status levels out consider weaning in am .  O2 sat >92% acceptable    ID: Leukocytosis -improving  No fevers no hemodynamic instability Given Vancomycin and Zosyn in the ED empirically,   Follow cx data -neg to date  Off Vancomycin given AKI  PCT neg .  Trend WBC, fever curve, lactate- does not appear to have any infection , consider d/c abx.  HIV neg , Hep C neg , Hep B neg   Endocrine: Check TSH No H/o insulin dependence Add SSI if BS tr up on chem   GI: R/O Esophageal injury-neg on CT Chest with NG contrast  Elevated Transaminases- LFT improving  Begin TF  Cont PPI   Heme: Mild thrombocytopenia  No active bleeding   DVT PPX  ( SCDs and Heparin) Tr cbc    RENAL Anion Gap Metabolic Acidosis Acute Kidney injury  Rhabdomyolysis ?injury with elevated CK level  Hypokalemia - Elevated Lactate > tr down  Baseline Cr at 1.22 now 2.3> tr down 1.5  H/o elevated Lithium levels 3-4 days>normal on admission   cont IVF w/ NS at 100cc/hr  Strict I/O  .Marland Kitchentr CK level  K + replaced    Lab Results  Component Value Date   CREATININE 1.23 04/28/2017   CREATININE 1.59 (H) 04/27/2017   CREATININE 2.20 (H) 04/26/2017    .   CODE STATUS: Full PROGNOSIS: Guarded FAMILY: not at bedside.  Accompanied by officers from the [facility]  Audley Hinojos NP-C  [facility]  [phone]   04/28/2017, 8:27 AM

## 2017-04-28 NOTE — Plan of Care (Signed)
  Problem: Clinical Measurements: Goal: Ability to maintain clinical measurements within normal limits will improve Outcome: Progressing Goal: Will remain free from infection Outcome: Progressing Goal: Diagnostic test results will improve Outcome: Progressing Note:  Creatinine trending down Goal: Respiratory complications will improve Outcome: Progressing Goal: Cardiovascular complication will be avoided Outcome: Progressing   Problem: Activity: Goal: Risk for activity intolerance will decrease Outcome: Progressing   Problem: Nutrition: Goal: Adequate nutrition will be maintained Outcome: Progressing Note:  Tube feedings started   Problem: Coping: Goal: Level of anxiety will decrease Outcome: Progressing   Problem: Elimination: Goal: Will not experience complications related to bowel motility Outcome: Progressing Goal: Will not experience complications related to urinary retention Outcome: Progressing   Problem: Pain Managment: Goal: General experience of comfort will improve Outcome: Progressing   Problem: Safety: Goal: Ability to remain free from injury will improve Outcome: Progressing   Problem: Skin Integrity: Goal: Risk for impaired skin integrity will decrease Outcome: Progressing   Problem: Activity: Goal: Ability to tolerate increased activity will improve Outcome: Progressing   Problem: Respiratory: Goal: Ability to maintain a clear airway and adequate ventilation will improve Outcome: Progressing

## 2017-04-28 NOTE — Progress Notes (Signed)
Brief Nutrition Note  Consult received for enteral/tube feeding initiation and management.  Adult Enteral Nutrition Protocol initiated. [facility]  Admitting Dx: Pneumomediastinum (HCC) [J98.2] Perforation esophagus [K22.3] Pneumopericardium [I31.9] Elevated LFTs [R94.5]  Body mass index is 38.63 kg/m. Pt meets criteria for obesity based on current BMI.  Labs:  Recent Labs  Lab 04/26/17 1527 04/26/17 1607 04/27/17 0227 04/28/17 0225  NA 141 142 141 144  K 3.7 3.6 3.5 3.4*  CL 100* 102 106 109  CO2 20*  --  21* 24  BUN 37* 33* 23* 14  CREATININE 2.30* 2.20* 1.59* 1.23  CALCIUM 9.5  --  8.6* 8.7*  MG  --   --  2.1 2.5*  PHOS  --   --  4.7* 3.9  GLUCOSE 117* 112* 116* 100*    Brandon FrancoLindsey Roshawna Colclasure, MS, RD, LDN [facility] Clinical Dietitian Pager: [phone] After Hours Pager: [phone]

## 2017-04-29 ENCOUNTER — Inpatient Hospital Stay (HOSPITAL_COMMUNITY): Payer: 59

## 2017-04-29 LAB — CBC
HCT: 32.8 % — ABNORMAL LOW (ref 39.0–52.0)
Hemoglobin: 10.8 g/dL — ABNORMAL LOW (ref 13.0–17.0)
MCH: 25.7 pg — ABNORMAL LOW (ref 26.0–34.0)
MCHC: 32.9 g/dL (ref 30.0–36.0)
MCV: 78.1 fL (ref 78.0–100.0)
Platelets: 142 10*3/uL — ABNORMAL LOW (ref 150–400)
RBC: 4.2 MIL/uL — ABNORMAL LOW (ref 4.22–5.81)
RDW: 15.1 % (ref 11.5–15.5)
WBC: 8.1 10*3/uL (ref 4.0–10.5)

## 2017-04-29 LAB — MAGNESIUM: Magnesium: 1.9 mg/dL (ref 1.7–2.4)

## 2017-04-29 LAB — BLOOD GAS, ARTERIAL
ACID-BASE EXCESS: 2.8 mmol/L — AB (ref 0.0–2.0)
BICARBONATE: 28.6 mmol/L — AB (ref 20.0–28.0)
DRAWN BY: 519031
FIO2: 30
MODE: POSITIVE
O2 SAT: 93.2 %
PCO2 ART: 59.5 mmHg — AB (ref 32.0–48.0)
PEEP: 5 cmH2O
PH ART: 7.303 — AB (ref 7.350–7.450)
PO2 ART: 71.6 mmHg — AB (ref 83.0–108.0)
Patient temperature: 98.6
Pressure support: 10 cmH2O

## 2017-04-29 LAB — GLUCOSE, CAPILLARY
GLUCOSE-CAPILLARY: 99 mg/dL (ref 65–99)
GLUCOSE-CAPILLARY: 97 mg/dL (ref 65–99)
Glucose-Capillary: 89 mg/dL (ref 65–99)
Glucose-Capillary: 97 mg/dL (ref 65–99)
Glucose-Capillary: 98 mg/dL (ref 65–99)
Glucose-Capillary: 90 mg/dL (ref 65–99)

## 2017-04-29 LAB — BASIC METABOLIC PANEL
ANION GAP: 5 (ref 5–15)
BUN: 12 mg/dL (ref 6–20)
CALCIUM: 7.7 mg/dL — AB (ref 8.9–10.3)
CO2: 27 mmol/L (ref 22–32)
Chloride: 116 mmol/L — ABNORMAL HIGH (ref 101–111)
Creatinine, Ser: 0.93 mg/dL (ref 0.61–1.24)
Glucose, Bld: 91 mg/dL (ref 65–99)
Potassium: 3 mmol/L — ABNORMAL LOW (ref 3.5–5.1)
SODIUM: 148 mmol/L — AB (ref 135–145)

## 2017-04-29 LAB — PHOSPHORUS: PHOSPHORUS: 3.6 mg/dL (ref 2.5–4.6)

## 2017-04-29 LAB — PROCALCITONIN

## 2017-04-29 LAB — CK: CK TOTAL: 1647 U/L — AB (ref 49–397)

## 2017-04-29 LAB — TSH: TSH: 2.006 u[IU]/mL (ref 0.350–4.500)

## 2017-04-29 MED ORDER — PANTOPRAZOLE SODIUM 40 MG PO PACK
40.0000 mg | PACK | Freq: Every day | ORAL | Status: DC
  Administered 2017-04-29: 40 mg
  Filled 2017-04-29 (×2): qty 20

## 2017-04-29 MED ORDER — POTASSIUM CHLORIDE 20 MEQ/15ML (10%) PO SOLN
40.0000 meq | Freq: Once | ORAL | Status: AC
  Administered 2017-04-29: 40 meq via ORAL

## 2017-04-29 MED ORDER — POTASSIUM CHLORIDE 20 MEQ/15ML (10%) PO SOLN
ORAL | Status: AC
  Filled 2017-04-29: qty 30

## 2017-04-29 NOTE — Progress Notes (Signed)
eLink Physician-Brief Progress Note Patient Name: Brandon AuerSherwood Helzer DOB: 01-05-1991 MRN: [id number]   Date of Service  04/29/2017  HPI/Events of Note  Agitation - Request to renew orders for bilateral soft wrist restraints.   eICU Interventions  Will renew orders for bilateral soft wrist restraints.      Intervention Category Minor Interventions: Agitation / anxiety - evaluation and management  Lenell AntuSommer,Steven Eugene 04/29/2017, 8:17 PM

## 2017-04-29 NOTE — Progress Notes (Signed)
.. ..  Name: Brandon Fuentes MRN: [id number] DOB: May 07, 1990    ADMISSION DATE:  04/26/2017 CONSULTATION DATE:  04/26/17  REFERRING MD :  ED PHYSICIAN- [facility]  CHIEF COMPLAINT:  Agitation and combative  BRIEF PATIENT DESCRIPTION: 27 yr old Bipolar pt intubated found to have pneumomediastinum and pneumopericardium PCCM consulted for admission.  SIGNIFICANT EVENTS  intubated  STUDIES:  3/29 Labs:AGMA, lactic acid 4.25, UDS negative, BUN/Cr 33/2.2 (elevated from baseline)  CT official read: Extensive pneumomediastinum and pneumopericardium is noted. Extensive soft tissue gas is seen in the supraclavicular and cervical soft tissues.  This is concerning for rupture of bronchus or esophagus.  Endotracheal tube is directed into right mainstem bronchus; withdrawal by 3-4 cm is recommended.    HISTORY OF PRESENT ILLNESS:  (Pt intubated, history acquired from other care providers including RN and Triage notes)  27 yr old male with Bipolar Type 1 disorder [facility] from jail [facility] EMS with a recent history of high lithium levels. The medication was discontinued and he has now received no therapy for the last 3-4 days. Combative at the jail brought in for medical clearance. In the ER the pt was combative, screaming, cursing and not answering questions appropiately.  He received  Imaging showed a Pneumomediastinum with Pneumopericardium and ETT in the Right mainstem bronchus. PCCM consulted for admission.  Upon my evaluation of the chart, objective information and the patient, a consult was placed to CTS due to the fact that no clear etiology for the pneumomediastinum.  CTS recommended transfer to [facility] for management.   SUBJECTIVE:  Pt remains intubated on vent .  Sedation decreased this am for wake up assessment , pt with increased alertness, f/c simple commands .  SQ air is decreased and periorbital edema resolved.  CT chest with NG contrast neg for esophageal perforation  Officers at bedside , hand  restraints and feet cuffs in place     VITAL SIGNS: Temp:  [97.7 F (36.5 C)-98.6 F (37 C)] 98.5 F (36.9 C) (04/01 0800) Pulse Rate:  [45-93] 93 (04/01 0904) Resp:  [16-20] 20 (04/01 0904) BP: (84-144)/(47-101) 135/99 (04/01 0904) SpO2:  [98 %-100 %] 98 % (04/01 0904) FiO2 (%):  [30 %] 30 % (04/01 0904) Weight:  [137.7 kg (303 lb 9.2 oz)] 137.7 kg (303 lb 9.2 oz) (04/01 0257)  PHYSICAL EXAMINATION: General: Intubated on vent  Neuro:  Sedated on vent , alert on WUA , followed simple commands.  HEENT:  ETT in place , MIn sq air along neck ,   Cardiovascular:  RRR , NSR  Lungs:  CTA ., no wheezing  Abdomen:  Soft , + BS  Musculoskeletal:  Intact, wrist restraints , chain restraints on LE .  Skin: no skin breakdown   Recent Labs  Lab 04/27/17 0227 04/28/17 0225 04/29/17 0555  NA 141 144 148*  K 3.5 3.4* 3.0*  CL 106 109 116*  CO2 21* 24 27  BUN 23* 14 12  CREATININE 1.59* 1.23 0.93  GLUCOSE 116* 100* 91   Recent Labs  Lab 04/27/17 0227 04/28/17 0225 04/29/17 0555  HGB 12.6* 12.1* 10.8*  HCT 36.9* 36.2* 32.8*  WBC 16.8* 12.0* 8.1  PLT 149* 134* 142*   Ct Chest W Contrast  Result Date: 04/27/2017 CLINICAL DATA:  Evaluate for esophageal injury.  Pneumomediastinum. EXAM: CT CHEST WITH CONTRAST TECHNIQUE: Multidetector CT imaging of the chest was performed during intravenous contrast administration. CONTRAST:  75mL ISOVUE-300 IOPAMIDOL (ISOVUE-300) INJECTION 61% COMPARISON:  CT chest 04/26/2017 FINDINGS:  Cardiovascular: No significant vascular findings. Normal heart size. No pericardial effusion. Mediastinum/Nodes: No enlarged mediastinal, hilar, or axillary lymph nodes. Thyroid gland and trachea are normal. Endotracheal tube in satisfactory position. Nasogastric tube with the tip terminating in the mid esophagus. Oral contrast was hand injected through the nasogastric tube. No contrast within the esophagus. Contrast is present within the stomach. No extraluminal contrast  in the mediastinum. Pneumomediastinum and pneumopericardium with extension into the neck soft tissue. Lungs/Pleura: Bibasilar atelectasis. No pleural effusion or pneumothorax. No pneumothorax. Upper Abdomen: No acute abnormality. Musculoskeletal: No acute osseous abnormality. No aggressive osseous lesion. IMPRESSION: 1. Pneumomediastinum and pneumopericardium with extension into the neck soft tissue. Nasogastric tube with the tip terminating in the mid esophagus. Oral contrast was hand injected through the nasogastric tube. No contrast within the esophagus. Contrast is present within the stomach. No extraluminal contrast in the mediastinum to suggest perforation. The upper esophagus is suboptimally evaluated given that the nasogastric tube terminates in the mid esophagus. Electronically Signed   By: Elige Ko   On: 04/27/2017 14:52   Dg Chest Port 1 View  Result Date: 04/29/2017 CLINICAL DATA:  Respiratory failure EXAM: PORTABLE CHEST 1 VIEW COMPARISON:  04/28/2017 FINDINGS: Endotracheal tube 15 mm above the carina. Recommend withdrawal 2 cm. NG tube in the stomach. Decreased lung volume compared to the prior study. Slight increase in bibasilar atelectasis. Negative for edema or effusion IMPRESSION: Low endotracheal tube, recommend withdrawal 2 cm Hypoventilation with bibasilar atelectasis slightly increased from the prior study. Electronically Signed   By: Marlan Palau M.D.   On: 04/29/2017 08:00   Dg Chest Port 1 View  Result Date: 04/28/2017 CLINICAL DATA:  Respiratory failure EXAM: PORTABLE CHEST 1 VIEW COMPARISON:  Yesterday FINDINGS: Endotracheal tube tip at the clavicular heads. An orogastric tube reaches the stomach. Low volume chest with mild streaky density at the bases correlating with atelectasis on prior chest CT. Pneumomediastinum without detected change. No visible pneumothorax. IMPRESSION: 1. Low volume chest with mild atelectasis. 2. Pneumomediastinum without detected progression. No  visible pneumothorax. 3. Unremarkable tube positioning. Electronically Signed   By: Marnee Spring M.D.   On: 04/28/2017 08:35    ASSESSMENT / PLAN: NEURO: Altered mental status - h/o Bipolar Type 1 Disease, Aggressive behavior reported  Significant Agitation and combative behavior requiring sedation  I will wean down his versed and attempt a CPAP trial  H/o supratherapeutic levels of Lithium then cessation of medication-remain off lithium   Lithium levels on presentation 0.85 normal  Pt remains intubated and sedated, wean sedation as able . Will need to be careful as pt very violent on presentation  ? May need to start slowly adding back Psych meds (was on remeron, trileptal , symmetrel, depakote, and abilify )   CARDIAC: Pneumopericardium Not currently on Vasopressors MAP goal>75mmHg CTS following  Elevated Lactic-LA has cleared  Monitor QTc (baseline 463)  Avoid Qt prolonging meds   PULMONARY: Respiratory Failure on Vent support  Pneumomediastinum- etiology unknown No esophageal injury (tear/rupture) on CT w/ NG contrast  CTS following  Cont vent setting w/ PEEP 0 to limit worsening of pneumomediastinum with excessive positive pressure.  CXR 4/1 is stable , visible sq air is decreased . WUA with improved alertness If able to wean sedation /mental status levels out consider weaning in am .  O2 sat >92% acceptable    ID: Leukocytosis -improving  No fevers no hemodynamic instability Given Vancomycin and Zosyn in the ED empirically,   Follow cx data -neg  to date  Off Vancomycin given AKI  PCT neg .  Trend WBC, fever curve, lactate- does not appear to have any infection , consider d/c abx.  HIV neg , Hep C neg , Hep B neg   Endocrine: Check TSH No H/o insulin dependence Add SSI if BS tr up on chem   GI: R/O Esophageal injury-neg on CT Chest with NG contrast  Elevated Transaminases- LFT improving  Begin TF  Cont PPI   Heme: Mild thrombocytopenia  No active  bleeding   DVT PPX  ( SCDs and Heparin) Tr cbc    RENAL Anion Gap Metabolic Acidosis Acute Kidney injury  Rhabdomyolysis ?injury with elevated CK level  Hypokalemia -  H/o elevated Lithium levels 3-4 days>normal on admission   cont IVF w/ NS at 100cc/hr  Strict I/O  .Marland Kitchen.tr CK level  K + replaced    Lab Results  Component Value Date   CREATININE 0.93 04/29/2017   CREATININE 1.23 04/28/2017   CREATININE 1.59 (H) 04/27/2017    .   CODE STATUS: Full PROGNOSIS: Guarded FAMILY: not at bedside. Accom[facility]   I have spent 32 mins of CC time bedside or in the unit exclusive of billable procedures

## 2017-04-29 NOTE — Progress Notes (Signed)
Patient vomited a moderate amount of tube feeding. MD notified and states to d/c till tomorrow.

## 2017-04-29 NOTE — Progress Notes (Signed)
Nutrition Follow-up  DOCUMENTATION CODES:   Obesity unspecified  INTERVENTION:   Continue Vital High Protein @ 55 ml/hr (1320 ml/day) 60 ml Prostat BID  Provides: 1720 kcal, 175 grams protein, and 1103 ml free water. Meets 100% of calorie and protein needs.   NUTRITION DIAGNOSIS:   Inadequate oral intake related to inability to eat as evidenced by NPO status.  Ongoing  GOAL:   Provide needs based on ASPEN/SCCM guidelines  Meeting  MONITOR:   Vent status, I & O's  REASON FOR ASSESSMENT:   Ventilator    ASSESSMENT:   Pt with PMH of Bipolar type 1 disease with recent cessation of Lithium (levels WNL on admission) admitted from jail with pneumomediastinum and pneumopericardium.    Remains off pressor support.  Start to wean off ventilator yesterday/today.  Chest CT negative for esophageal perforation.  Tube feeding started yesterday at goal rate Vital HP @ 55 ml/hr with 60 ml Prostat BID via NGT.  No issues reported.  Weight noted to increase 18 lb since last RD visit 3/30 (285 lb to 303 lb).  Will continue to monitor trends.   Patient is currently intubated on ventilator support MV: 9.2 L/min Temp (24hrs), Avg:98.2 F (36.8 C), Min:97.7 F (36.5 C), Max:98.6 F (37 C) BP: 112/67 MAP 80  I/O: + 6684 ml since admit UOP: 810 ml yesterday  Medications reviewed and include: fentanyl, versed, IV abx Labs reviewed: Na 148 (H) Na 3.0 (L) AST 140 (H) ALT 85 (H)   Diet Order:  No diet orders on file  EDUCATION NEEDS:   No education needs have been identified at this time  Skin:  Skin Assessment: Reviewed RN Assessment  Last BM:  04/26/17  Height:   Ht Readings from Last 1 Encounters:  04/27/17 6\' 1"  (1.854 m)    Weight:   Wt Readings from Last 1 Encounters:  04/29/17 (!) 303 lb 9.2 oz (137.7 kg)    Ideal Body Weight:  83.6 kg  BMI:  Body mass index is 40.05 kg/m.  Estimated Nutritional Needs:   Kcal:  [id number]  Protein:  >/= 167  grams  Fluid:  2 L/day    Vanessa Kickarly Rishabh Rinkenberger RD, LDN Clinical Nutrition Pager # - [phone]

## 2017-04-30 ENCOUNTER — Inpatient Hospital Stay (HOSPITAL_COMMUNITY): Payer: 59

## 2017-04-30 DIAGNOSIS — R4182 Altered mental status, unspecified: Secondary | ICD-10-CM

## 2017-04-30 LAB — POCT I-STAT 3, ART BLOOD GAS (G3+)
ACID-BASE EXCESS: 1 mmol/L (ref 0.0–2.0)
BICARBONATE: 28.3 mmol/L — AB (ref 20.0–28.0)
O2 Saturation: 94 %
PCO2 ART: 54.2 mmHg — AB (ref 32.0–48.0)
PH ART: 7.326 — AB (ref 7.350–7.450)
PO2 ART: 77 mmHg — AB (ref 83.0–108.0)
Patient temperature: 98.6
TCO2: 30 mmol/L (ref 22–32)

## 2017-04-30 LAB — BASIC METABOLIC PANEL
ANION GAP: 8 (ref 5–15)
BUN: 11 mg/dL (ref 6–20)
CHLORIDE: 113 mmol/L — AB (ref 101–111)
CO2: 26 mmol/L (ref 22–32)
Calcium: 8.4 mg/dL — ABNORMAL LOW (ref 8.9–10.3)
Creatinine, Ser: 0.96 mg/dL (ref 0.61–1.24)
Glucose, Bld: 102 mg/dL — ABNORMAL HIGH (ref 65–99)
POTASSIUM: 3.4 mmol/L — AB (ref 3.5–5.1)
Sodium: 147 mmol/L — ABNORMAL HIGH (ref 135–145)

## 2017-04-30 LAB — GLUCOSE, CAPILLARY
GLUCOSE-CAPILLARY: 122 mg/dL — AB (ref 65–99)
GLUCOSE-CAPILLARY: 81 mg/dL (ref 65–99)
GLUCOSE-CAPILLARY: 83 mg/dL (ref 65–99)
GLUCOSE-CAPILLARY: 87 mg/dL (ref 65–99)
GLUCOSE-CAPILLARY: 97 mg/dL (ref 65–99)
Glucose-Capillary: 83 mg/dL (ref 65–99)
Glucose-Capillary: 106 mg/dL — ABNORMAL HIGH (ref 65–99)

## 2017-04-30 LAB — CBC WITH DIFFERENTIAL/PLATELET
BASOS ABS: 0 10*3/uL (ref 0.0–0.1)
BASOS PCT: 0 %
EOS ABS: 0.3 10*3/uL (ref 0.0–0.7)
EOS PCT: 4 %
HCT: 37.1 % — ABNORMAL LOW (ref 39.0–52.0)
HEMOGLOBIN: 12.1 g/dL — AB (ref 13.0–17.0)
LYMPHS ABS: 2.4 10*3/uL (ref 0.7–4.0)
Lymphocytes Relative: 27 %
MCH: 26 pg (ref 26.0–34.0)
MCHC: 32.6 g/dL (ref 30.0–36.0)
MCV: 79.6 fL (ref 78.0–100.0)
Monocytes Absolute: 1.1 10*3/uL — ABNORMAL HIGH (ref 0.1–1.0)
Monocytes Relative: 12 %
NEUTROS PCT: 57 %
Neutro Abs: 5.3 10*3/uL (ref 1.7–7.7)
PLATELETS: 173 10*3/uL (ref 150–400)
RBC: 4.66 MIL/uL (ref 4.22–5.81)
RDW: 15.4 % (ref 11.5–15.5)
WBC: 9.1 10*3/uL (ref 4.0–10.5)

## 2017-04-30 LAB — MAGNESIUM: Magnesium: 1.8 mg/dL (ref 1.7–2.4)

## 2017-04-30 LAB — PHOSPHORUS: Phosphorus: 3.7 mg/dL (ref 2.5–4.6)

## 2017-04-30 MED ORDER — DEXMEDETOMIDINE HCL IN NACL 200 MCG/50ML IV SOLN
0.4000 ug/kg/h | INTRAVENOUS | Status: DC
Start: 2017-04-30 — End: 2017-04-30
  Administered 2017-04-30: 0.4 ug/kg/h via INTRAVENOUS
  Administered 2017-04-30 (×3): 0.7 ug/kg/h via INTRAVENOUS
  Filled 2017-04-30 (×4): qty 50

## 2017-04-30 MED ORDER — DEXMEDETOMIDINE HCL IN NACL 400 MCG/100ML IV SOLN
0.4000 ug/kg/h | INTRAVENOUS | Status: DC
  Administered 2017-04-30: 0.9 ug/kg/h via INTRAVENOUS
  Administered 2017-04-30: 1 ug/kg/h via INTRAVENOUS
  Administered 2017-05-01: 0.9 ug/kg/h via INTRAVENOUS
  Administered 2017-05-01: 1.1 ug/kg/h via INTRAVENOUS
  Administered 2017-05-01: 0.8 ug/kg/h via INTRAVENOUS
  Administered 2017-05-01: 0.9 ug/kg/h via INTRAVENOUS
  Administered 2017-05-01: 0.8 ug/kg/h via INTRAVENOUS
  Administered 2017-05-01: 0.6 ug/kg/h via INTRAVENOUS
  Administered 2017-05-02: 1 ug/kg/h via INTRAVENOUS
  Administered 2017-05-02: 0.9 ug/kg/h via INTRAVENOUS
  Administered 2017-05-02: 0.7 ug/kg/h via INTRAVENOUS
  Administered 2017-05-02: 0.9 ug/kg/h via INTRAVENOUS
  Administered 2017-05-02 – 2017-05-03 (×2): 0.7 ug/kg/h via INTRAVENOUS
  Administered 2017-05-03: 0.4 ug/kg/h via INTRAVENOUS
  Administered 2017-05-03 (×4): 0.7 ug/kg/h via INTRAVENOUS
  Administered 2017-05-04: 0.2 ug/kg/h via INTRAVENOUS
  Administered 2017-05-04: 0.4 ug/kg/h via INTRAVENOUS
  Administered 2017-05-04: 0.5 ug/kg/h via INTRAVENOUS
  Administered 2017-05-05 (×2): 0.4 ug/kg/h via INTRAVENOUS
  Administered 2017-05-06: 1.2 ug/kg/h via INTRAVENOUS
  Administered 2017-05-06: 0.7 ug/kg/h via INTRAVENOUS
  Administered 2017-05-06: 0.5 ug/kg/h via INTRAVENOUS
  Administered 2017-05-06: 0.2 ug/kg/h via INTRAVENOUS
  Administered 2017-05-07: 0.5 ug/kg/h via INTRAVENOUS
  Administered 2017-05-07: 0.8 ug/kg/h via INTRAVENOUS
  Filled 2017-04-30 (×32): qty 100

## 2017-04-30 NOTE — Progress Notes (Signed)
Pt extubated refuses to wear nasal canula. Pt grabbed tubing and refused to let go of tubing. Pt sats 89% on room air. Pt cussing at RT and RN in room. Dr. Minette HeadlandAljishi made aware new orders received will continue to monitor.

## 2017-04-30 NOTE — Progress Notes (Signed)
.. ..  Name: Brandon Fuentes MRN: [id number] DOB: 01/25/1991    ADMISSION DATE:  04/26/2017 CONSULTATION DATE:  04/26/17  REFERRING MD :  ED PHYSICIAN- [facility]  CHIEF COMPLAINT:  Agitation and combative  BRIEF PATIENT DESCRIPTION: 27 yr old Bipolar pt intubated found to have pneumomediastinum and pneumopericardium PCCM consulted for admission.  SIGNIFICANT EVENTS  intubated  STUDIES:  3/29 Labs:AGMA, lactic acid 4.25, UDS negative, BUN/Cr 33/2.2 (elevated from baseline)  CT official read: Extensive pneumomediastinum and pneumopericardium is noted. Extensive soft tissue gas is seen in the supraclavicular and cervical soft tissues.  This is concerning for rupture of bronchus or esophagus.  Endotracheal tube is directed into right mainstem bronchus; withdrawal by 3-4 cm is recommended.    HISTORY OF PRESENT ILLNESS:  (Pt intubated, history acquired from other care providers including RN and Triage notes)  27 yr old male with Bipolar Type 1 disorder presents from jail per EMS with a recent history of high lithium levels. The medication was discontinued and he has now received no therapy for the last 3-4 days. Combative at the jail brought in for medical clearance. In the ER the pt was combative, screaming, cursing and not answering questions appropiately.  He received  Imaging showed a Pneumomediastinum with Pneumopericardium and ETT in the Right mainstem bronchus. PCCM consulted for admission.  Upon my evaluation of the chart, objective information and the patient, a consult was placed to CTS due to the fact that no clear etiology for the pneumomediastinum.  [facility]   SUBJECTIVE:  Awake and follows commands despite sedation, is cooperative.        VITAL SIGNS: Temp:  [98.1 F (36.7 C)-98.8 F (37.1 C)] 98.6 F (37 C) (04/02 0800) Pulse Rate:  [52-118] 105 (04/02 0800) Resp:  [16-26] 19 (04/02 0800) BP: (88-139)/(53-103) 129/86 (04/02  0800) SpO2:  [81 %-100 %] 98 % (04/02 0800) FiO2 (%):  [30 %] 30 % (04/02 0400) Weight:  [139.4 kg (307 lb 5.1 oz)] 139.4 kg (307 lb 5.1 oz) (04/02 0400)  PHYSICAL EXAMINATION: General: Well-nourished well-developed male in no acute distress HEENT: Endotracheal and orogastric tube in place PSY: Cooperative Neuro: Cooperative, post commands moves all extremities none combative at this time CV: s1s2 rrr, no m/r/g PULM: even/non-labored, lungs bilaterally decreased in bases WU:JWJXGI:soft, non-tender, bsx4 active  Extremities: warm/dry negative edema  Skin: no rashes or lesions   Recent Labs  Lab 04/27/17 0227 04/28/17 0225 04/29/17 0555  NA 141 144 148*  K 3.5 3.4* 3.0*  CL 106 109 116*  CO2 21* 24 27  BUN 23* 14 12  CREATININE 1.59* 1.23 0.93  GLUCOSE 116* 100* 91   Recent Labs  Lab 04/28/17 0225 04/29/17 0555 04/30/17 0824  HGB 12.1* 10.8* 12.1*  HCT 36.2* 32.8* 37.1*  WBC 12.0* 8.1 9.1  PLT 134* 142* 173   Dg Chest Port 1 View  Result Date: 04/30/2017 CLINICAL DATA:  Hypoxia EXAM: PORTABLE CHEST 1 VIEW COMPARISON:  April 29, 2017 FINDINGS: Endotracheal tube tip is 2.2 cm above the carina. Nasogastric tube tip and side port are below the diaphragm. No pneumothorax. There is atelectatic change in the left base. Lungs elsewhere clear. Heart is mildly enlarged with pulmonary vascularity within normal limits. No adenopathy. No bone lesions. A small amount of air remains in the supraclavicular region on the left. IMPRESSION: Tube positions as described without pneumothorax. Left base atelectasis. No new opacity. Stable cardiomegaly. Soft tissue air in the left supraclavicular  region remains. Electronically Signed   By: Bretta Bang III M.D.   On: 04/30/2017 08:23   Dg Chest Port 1 View  Result Date: 04/29/2017 CLINICAL DATA:  Respiratory failure EXAM: PORTABLE CHEST 1 VIEW COMPARISON:  04/28/2017 FINDINGS: Endotracheal tube 15 mm above the carina. Recommend withdrawal 2 cm. NG  tube in the stomach. Decreased lung volume compared to the prior study. Slight increase in bibasilar atelectasis. Negative for edema or effusion IMPRESSION: Low endotracheal tube, recommend withdrawal 2 cm Hypoventilation with bibasilar atelectasis slightly increased from the prior study. Electronically Signed   By: Marlan Palau M.D.   On: 04/29/2017 08:00    ASSESSMENT / PLAN: NEURO: A: Altered mental status - h/o Bipolar Type 1 Disease, Aggressive behavior reported  P: Sedation as needed Monitor lithium level Restraints as needed May need to resume lithium once levels have reached normal He has a history of violent behavior therefore he is being evaluated by Sheriff's deputies  CARDIAC: A: Pneumopericardium  P: Continue to monitor not hypertensive at this time May need follow-up evaluation with CT scan near future   PULMONARY: A: Respiratory Failure on Vent support  Pneumomediastinum- etiology unknown  P" Wean as tolerated from ventilator Daily chest x-rays to evaluate for any increase in pneumomediastinum Add low-dose P of 3 back to weaning protocol  ID: A:  Concern for aspiration Concern for ruptured esophagus  P: Empirical antibiotics continue Zosyn Monitor fever curve Monitor WBC  Endocrine: CBG (last 3)  Recent Labs    04/30/17 0007 04/30/17 0343 04/30/17 0829  GLUCAP 83 83 81    Sliding scale insulin TSH noted to be 2.006  GI: R/O Esophageal injury-neg on CT Chest with NG contrast  P: Continue to follow for extensive pneumomediastinum and pneumopericardium with possibility of ruptured esophagus  Heme: Recent Labs    04/29/17 0555 04/30/17 0824  HGB 10.8* 12.1*   P: DVT prophylaxis Follow hemoglobin  RENAL Lab Results  Component Value Date   CREATININE 0.96 04/30/2017   CREATININE 0.93 04/29/2017   CREATININE 1.23 04/28/2017   Recent Labs  Lab 04/28/17 0225 04/29/17 0555 04/30/17 0824  K 3.4* 3.0* 3.4*   Recent Labs  Lab  04/28/17 0225 04/29/17 0555 04/30/17 0824  NA 144 148* 147*     Anion Gap Metabolic Acidosis Acute Kidney injury  Rhabdomyolysis ?injury with elevated CK level  Hypokalemia - Hypernatremia  P:  Replete potassium Follow creatinine Monitor sodium level Reevaluate lithium level in preparation for his resuming lithium for bipolar disease   Lab Results  Component Value Date   CREATININE 0.93 04/29/2017   CREATININE 1.23 04/28/2017   CREATININE 1.59 (H) 04/27/2017    .   CODE STATUS: Full PROGNOSIS: Guarded FAMILY: not at bedside. Accompanied by officers from the [place]   App cct 30 min   Brett Canales Matayah Reyburn ACNP [facility]ack PCCM Pager [phone] till 1 pm If no answer page [phone] 04/30/2017, 9:01 AM

## 2017-04-30 NOTE — Progress Notes (Signed)
55 ml of versed gtt wasted in sink witnessed by Higinio PlanShammy Adams RN and Oren BracketAmanda Ebrima Ranta RN.   70ml of Fentanyl gtt wasted in sink witnessed by Higinio PlanShammy Adams RN and Oren BracketAmanda Lorne Winkels RN.

## 2017-04-30 NOTE — Procedures (Signed)
Extubation Procedure Note  Patient Details:   Name: Lise AuerSherwood Ostergaard DOB: [birth year] MRN: [id number]   Airway Documentation:     Evaluation  O2 sats: stable throughout Complications: No apparent complications Patient did tolerate procedure well. Bilateral Breath Sounds: Diminished, Rhonchi   Yes  Pt extubated per MD order, RN at bedside. Pt able to speak, NARD VSS, no stridor noted. Pt refusing to wear 3L [place], combative, grabbed RNs hand and would not wear [place]. Will cont to monitor.   Betsy PriesJessica  Yavonne Kiss 04/30/2017, 2:31 PM

## 2017-04-30 NOTE — Progress Notes (Signed)
Emergency planning/management officerheriff officer presented to room with court documents. Notified this RN he is being released from their Management consultant(sheriff office) custody. Advised this RN we may now notify family and pt has court date in 2 days.

## 2017-04-30 NOTE — Progress Notes (Signed)
Report given to Royann ShiversJen Motley, RN. Pt transported to 2MW01 on tele and NRB. Belongings and paper chart with patient.

## 2017-04-30 NOTE — Progress Notes (Signed)
Pharmacy Antibiotic Note  Lise AuerSherwood Rasch is a 27 y.o. male admitted on 04/26/2017 with altered mental status.  Pharmacy has been consulted for Zosyn dosing. Pneumomediastinum or CXR with possible ruptured esophagus.   Afebrile overnight, wbc 8. Did have issues with tube feedings so they are on hold. CXR stable 4/1. Scr has improved to within normal limits. No growth in cultures   Plan: Consider de-escalation of antibiotics as able Continue Zosyn 3.375G IV q8h to be infused over 4 hours Trend WBC, temp, renal function    Height: 6\' 1"  (185.4 cm) Weight: (!) 307 lb 5.1 oz (139.4 kg) IBW/kg (Calculated) : 79.9  Temp (24hrs), Avg:98.5 F (36.9 C), Min:98.1 F (36.7 C), Max:98.8 F (37.1 C)  Recent Labs  Lab 04/26/17 1527 04/26/17 1535 04/26/17 1607 04/27/17 0227 04/27/17 0228 04/27/17 0819 04/28/17 0225 04/29/17 0555  WBC 19.8*  --   --  16.8*  --   --  12.0* 8.1  CREATININE 2.30*  --  2.20* 1.59*  --   --  1.23 0.93  LATICACIDVEN  --  4.25*  --   --  1.2 1.5 1.2  --     Estimated Creatinine Clearance: 179.6 mL/min (by C-G formula based on SCr of 0.93 mg/dL).    Allergies  Allergen Reactions  . Depakote [Divalproex Sodium] Other (See Comments)    Sleepiness, anger  . Geodon [Ziprasidone Hcl] Other (See Comments)    Per mom pt get very agitated  . Zoloft [Sertraline Hcl]     anger   Culture Data:  3/29 UCx > neg 3/29 BCx x 2 > ngtd 3/30 MRSA PCR - positive  Sheppard CoilFrank Wilson PharmD., BCPS Clinical Pharmacist 04/30/2017 7:53 AM

## 2017-05-01 ENCOUNTER — Encounter (HOSPITAL_COMMUNITY): Payer: Self-pay

## 2017-05-01 ENCOUNTER — Other Ambulatory Visit: Payer: Self-pay

## 2017-05-01 ENCOUNTER — Inpatient Hospital Stay (HOSPITAL_COMMUNITY): Payer: 59

## 2017-05-01 DIAGNOSIS — R410 Disorientation, unspecified: Secondary | ICD-10-CM

## 2017-05-01 DIAGNOSIS — Z811 Family history of alcohol abuse and dependence: Secondary | ICD-10-CM

## 2017-05-01 DIAGNOSIS — F3113 Bipolar disorder, current episode manic without psychotic features, severe: Secondary | ICD-10-CM

## 2017-05-01 DIAGNOSIS — R4182 Altered mental status, unspecified: Secondary | ICD-10-CM

## 2017-05-01 LAB — CBC WITH DIFFERENTIAL/PLATELET
BASOS ABS: 0 10*3/uL (ref 0.0–0.1)
Basophils Relative: 0 %
EOS ABS: 0.4 10*3/uL (ref 0.0–0.7)
EOS PCT: 4 %
HCT: 38.4 % — ABNORMAL LOW (ref 39.0–52.0)
Hemoglobin: 13.1 g/dL (ref 13.0–17.0)
Lymphocytes Relative: 27 %
Lymphs Abs: 2.6 10*3/uL (ref 0.7–4.0)
MCH: 26.7 pg (ref 26.0–34.0)
MCHC: 34.1 g/dL (ref 30.0–36.0)
MCV: 78.2 fL (ref 78.0–100.0)
MONO ABS: 0.8 10*3/uL (ref 0.1–1.0)
Monocytes Relative: 8 %
Neutro Abs: 5.7 10*3/uL (ref 1.7–7.7)
Neutrophils Relative %: 61 %
PLATELETS: 191 10*3/uL (ref 150–400)
RBC: 4.91 MIL/uL (ref 4.22–5.81)
RDW: 14.9 % (ref 11.5–15.5)
WBC: 9.4 10*3/uL (ref 4.0–10.5)

## 2017-05-01 LAB — CBC
HEMATOCRIT: 40.8 % (ref 39.0–52.0)
HEMOGLOBIN: 13.6 g/dL (ref 13.0–17.0)
MCH: 25.8 pg — ABNORMAL LOW (ref 26.0–34.0)
MCHC: 33.3 g/dL (ref 30.0–36.0)
MCV: 77.4 fL — AB (ref 78.0–100.0)
Platelets: 180 10*3/uL (ref 150–400)
RBC: 5.27 MIL/uL (ref 4.22–5.81)
RDW: 14 % (ref 11.5–15.5)
WBC: 10.1 10*3/uL (ref 4.0–10.5)

## 2017-05-01 LAB — GLUCOSE, CAPILLARY
GLUCOSE-CAPILLARY: 102 mg/dL — AB (ref 65–99)
Glucose-Capillary: 84 mg/dL (ref 65–99)
Glucose-Capillary: 93 mg/dL (ref 65–99)
Glucose-Capillary: 114 mg/dL — ABNORMAL HIGH (ref 65–99)
Glucose-Capillary: 115 mg/dL — ABNORMAL HIGH (ref 65–99)
Glucose-Capillary: 102 mg/dL — ABNORMAL HIGH (ref 65–99)

## 2017-05-01 LAB — BASIC METABOLIC PANEL
Anion gap: 12 (ref 5–15)
BUN: 9 mg/dL (ref 6–20)
CALCIUM: 9.1 mg/dL (ref 8.9–10.3)
CO2: 25 mmol/L (ref 22–32)
Chloride: 111 mmol/L (ref 101–111)
Creatinine, Ser: 0.9 mg/dL (ref 0.61–1.24)
GFR calc Af Amer: >60 mL/min (ref >60)
GLUCOSE: 102 mg/dL — AB (ref 65–99)
POTASSIUM: 3.6 mmol/L (ref 3.5–5.1)
SODIUM: 148 mmol/L — AB (ref 135–145)

## 2017-05-01 LAB — CULTURE, BLOOD (ROUTINE X 2)
CULTURE: NO GROWTH
SPECIAL REQUESTS: ADEQUATE

## 2017-05-01 LAB — MAGNESIUM: MAGNESIUM: 1.7 mg/dL (ref 1.7–2.4)

## 2017-05-01 LAB — PHOSPHORUS: PHOSPHORUS: 3 mg/dL (ref 2.5–4.6)

## 2017-05-01 LAB — CK: CK TOTAL: 472 U/L — AB (ref 49–397)

## 2017-05-01 MED ORDER — VALPROATE SODIUM 500 MG/5ML IV SOLN
500.0000 mg | Freq: Two times a day (BID) | INTRAVENOUS | Status: DC
  Administered 2017-05-01 (×2): 500 mg via INTRAVENOUS
  Filled 2017-05-01 (×3): qty 5

## 2017-05-01 MED ORDER — LORAZEPAM 2 MG/ML IJ SOLN
1.0000 mg | Freq: Four times a day (QID) | INTRAMUSCULAR | Status: DC | PRN
  Administered 2017-05-01: 1 mg via INTRAVENOUS
  Filled 2017-05-01: qty 1

## 2017-05-01 MED ORDER — HALOPERIDOL LACTATE 5 MG/ML IJ SOLN
5.0000 mg | Freq: Four times a day (QID) | INTRAMUSCULAR | Status: DC | PRN
  Administered 2017-05-01: 5 mg via INTRAVENOUS
  Filled 2017-05-01: qty 1

## 2017-05-01 MED ORDER — PANTOPRAZOLE SODIUM 40 MG IV SOLR
40.0000 mg | Freq: Every day | INTRAVENOUS | Status: DC
  Administered 2017-05-01 – 2017-05-09 (×9): 40 mg via INTRAVENOUS
  Filled 2017-05-01 (×9): qty 40

## 2017-05-01 MED ORDER — GLYCERIN (LAXATIVE) 2.1 G RE SUPP
1.0000 | Freq: Once | RECTAL | Status: AC
  Administered 2017-05-01: 1 via RECTAL
  Filled 2017-05-01: qty 1

## 2017-05-01 NOTE — Progress Notes (Signed)
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 States that Pt is intolerant of Haldol Geodon and Depakote and that they caused NMS last time.

## 2017-05-01 NOTE — Progress Notes (Addendum)
Patient appeared mildly agitated and was hitting at side of beds. However, was calmed after a few minutes.   Patient's mother at bedside stated that patient cannot tolerate haldol and geodon because doctors [facility] mentioned that he had a syndrome that she could not remember that caused problems with antipsychotics and certain antidepressants. 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 Patient does have some acute twitching noted on exam without any rigidity currently. He is afebrile.   The patient has thus far received 1 dose of haldol this morning.  -Continue precedex and versed. Can increase if needed.  -Discontinue haldol -Do not use lithium as patient presented with lithium toxicity. May start at a later time as lithium level is now normal- 0.85 (04/26/17). -Can consider IV Lorazepam 1-2mg  q6hrs if needed  -Manage modifiable risk factors- decrease room temperature and make sure well hydrated  -Dr. Sharma CovertNorman from psych evaluated patient and agreed with our plan for continuing with benzos and precedex and then starting back on lithium 300mg  bid after patient able to tolerate.    Brandon CourierVahini Randilyn Foisy, MD Internal Medicine PGY1 [phone] 05/01/2017, 4:58 PM

## 2017-05-01 NOTE — Progress Notes (Signed)
PULMONARY  / CRITICAL CARE MEDICINE  Name: Lise AuerSherwood Pakula MRN: [id number] DOB: [birth year]    LOS: 5  REFERRING MD : [facility]  CHIEF COMPLAINT:  Agitation and combative behavior   BRIEF PATIENT DESCRIPTION: Patient is a 27 y.o male with bipolar type 1 who presented from jail per EMS for altered mental status. Approximately 3-4 days prior to presentation the patient was found to have an elevated lithium level and his lithium was subsequently discontinued. The patient then became combative and altered. On Arrival in the ED he was combative, verbally abusive, and altered requiring sedation. CXR illustrated pneumomediastinum and pneumopericardium. Patient was subsequently intubated for airway protection and [facility]. Esophogram was performed that did not reveal esophageal rupture. Over the course of his hospitalization his pneumomediastinum and pneumopericardium improved and he was extubated on 4/2.   LINES / TUBES: Bilateral peripheral IVs  Urinary Catheter  Extubated on 4/2  CULTURES: Blood cultures 3/29 with NGTD Urine cultures 3/29 with NGTD  ANTIBIOTICS: Vancomycin x 1 on 3/29  Zosyn 3/29 -> current  INTERVAL HISTORY:  Patient somnolent this AM, not responding to questions but yelling out. Nursing reports that the patient became bradycardic overnight with HR in the 40s but with arousal his HR would increase to the 100s. Patient is still on Precedex sedation and with weaning becomes very combative and agitated. Patient also refusing to wear his NRB.  VITAL SIGNS: Temp:  [97.6 F (36.4 C)-99.2 F (37.3 C)] 98.9 F (37.2 C) (04/03 0700) Pulse Rate:  [46-138] 60 (04/03 0600) Resp:  [14-31] 28 (04/03 0600) BP: (108-151)/(65-113) 137/89 (04/03 0600) SpO2:  [87 %-100 %] 96 % (04/03 0600) FiO2 (%):  [40 %] 40 % (04/02 1310) Weight:  [302 lb 0.5 oz (137 kg)] 302 lb 0.5 oz (137 kg) (04/03 0500) HEMODYNAMICS:   VENTILATOR SETTINGS: Vent  Mode: PSV FiO2 (%):  [40 %] 40 % PEEP:  [3 cmH20] 3 cmH20 Pressure Support:  [10 cmH20] 10 cmH20   INTAKE / OUTPUT: Intake/Output      04/02 0701 - 04/03 0700 04/03 0701 - 04/04 0700   P.O.     I.V. (mL/kg) 1244.7 (9.1)    NG/GT 0    IV Piggyback 150    Total Intake(mL/kg) 1394.7 (10.2)    Urine (mL/kg/hr) 1005 (0.3)    Emesis/NG output     Total Output 1005    Net +389.7          PHYSICAL EXAMINATION: General: Obese gentleman resting in bed Neuro: Arousable but sedated, shouts when touched HEENT: Normocephalic, atraumatic, moist mucus membranes Cardiovascular: RRR, no murmurs, no rubs  Lungs: Good air movement with no wheezing or crackles  Abdomen: Active bowel sounds, soft, no tenderness to palpation  Musculoskeletal: Soft restraints in place, no LE edema  Skin: Warm and dry   LABS: Cbc Recent Labs  Lab 04/29/17 0555 04/30/17 0824 05/01/17 0452  WBC 8.1 9.1 9.4  HGB 10.8* 12.1* 13.1  HCT 32.8* 37.1* 38.4*  PLT 142* 173 191   Chemistry Recent Labs  Lab 04/29/17 0555 04/30/17 0302 04/30/17 0824 05/01/17 0452  NA 148*  --  147* 148*  K 3.0*  --  3.4* 3.6  CL 116*  --  113* 111  CO2 27  --  26 25  BUN 12  --  11 9  CREATININE 0.93  --  0.96 0.90  CALCIUM 7.7*  --  8.4* 9.1  MG 1.9 1.8  --  1.7  PHOS 3.6 3.7  --  3.0  GLUCOSE 91  --  102* 102*   Liver fxn Recent Labs  Lab 04/26/17 1527 04/28/17 0225  AST 421* 140*  ALT 145* 85*  ALKPHOS 58 47  BILITOT 2.6* 1.5*  PROT 8.3* 5.9*  ALBUMIN 5.0 3.3*   coags No results for input(s): APTT, INR in the last 168 hours.   Sepsis markers Recent Labs  Lab 04/27/17 0227 04/27/17 0228 04/27/17 0819 04/28/17 0225 04/29/17 0555  LATICACIDVEN  --  1.2 1.5 1.2  --   PROCALCITON <0.10  --   --  <0.10 <0.10   Cardiac markers Recent Labs  Lab 04/28/17 0225 04/29/17 0555  CKTOTAL 5,459* 1,647*   BNP No results for input(s): PROBNP in the last 168 hours.   ABG Recent Labs  Lab 04/26/17 1607  04/26/17 2133 04/29/17 1347 04/30/17 1049  PHART  --  7.343* 7.303* 7.326*  PCO2ART  --  40.5 59.5* 54.2*  PO2ART  --  339* 71.6* 77.0*  HCO3  --  21.6 28.6* 28.3*  TCO2 23  --   --  30   CBG trend Recent Labs  Lab 04/30/17 1644 04/30/17 2049 04/30/17 2318 05/01/17 0327 05/01/17 0719  GLUCAP 97 122* 106* 84 93   DIAGNOSES: Active Problems:   Acute delirium   Pneumomediastinum (HCC)   Pneumopericardium   Elevated LFTs   Endotracheally intubated   Respiratory failure (HCC)  ASSESSMENT / PLAN: Patient is a 27 y.o male with bipolar type 1 who presented from jail per EMS for altered mental status. Subsequently found to have pneumomediastinum and pneumopericardium of uncertain etiology with multiple negative CT chest scans and esophagram. Successfully extubated on 04/02.   NEUROLOGIC A: AMS Bipolar Type 1  P:   Sedated with precedex, and versed. Need to wean sedation but limited by agitation and combative behavior. Patient previously on lithium, oxcarbazepine, and depakote for mood stabilization. Lithium level now WNL. Would favor restarting mood stabilizer to see if we can wean sedation.  Soft restraints as needed  Released from sheriff's custody on 4/2  PULMONARY A: Pneumomediastinum of unknown etiology  Respiratory failure   P:   Negative esophagram  Extubated on 04/02, hemodynamically stable on NRB CXR today illustrating continued improvement of pneumomediastinum and pneumopericardium  CARDIOVASCULAR A: Pneumopericardium of unknown etiology   P:  CXR today illustrating continued improvement of pneumomediastinum and pneumopericardium Continue to monitor   INFECTIOUS A: Concern for aspiration/esophageal rupture   P:   Fever curve flat and leukocytosis resolved  Will discontinue Zosyn today   GASTROINTESTINAL A:  ?Esophageal rupture   P:   Negative CT chest with NG contrast  Continuing to monitor pneumomediastinum and pneumopericardium   RENAL A:  Acute Kidney Injury  HAGMA  P:   Resolved, continue to monitor   Pulmonary and Critical Care Medicine [facility] Pager: [phone]  05/01/2017, 7:40 AM

## 2017-05-01 NOTE — Progress Notes (Addendum)
1:28pm- CSW has faxed over letter to Brandon MeyerSusan Fuentes at this time. There is a copy of letter and confirmation page on chart as well. CSW will continue to follow fir nay further needs at this time.   12:16am- CSW spoke with Brandon DomJoe Fuentes form the DA's office and was informed that in order for court day to be contiued a letter with doctors signature stating that pt is here in the hospital would need to be signed by a doctor and then faxed over. CSW to follow up with MD on unit to see about letter at this time.   CS[facility] [place] [facility] [facility] and was informed that CSW would need to speak with the [employer] office regarding pt's case. CSW to reach out  to DA office at this time.    Brandon MangesKierra S. Leni Fuentes, MSW, LCSW-A Emergency Department Clinical Social Worker [phone]

## 2017-05-01 NOTE — Progress Notes (Addendum)
Patient was re-evaluated and was seem to be rigid, twitching, agitated. Vital signs showed normal pulse 70-80s, hemodynamically stable, fever of 101.1 (temperature has been trending up).  -Ordered CK -ordered cbc to check for leukocytosis -Given Ativan  -Monitor blood pressures  Brandon CourierVahini Zacari Radick, MD Internal Medicine PGY1 Pager:[phone] 05/01/2017, 7:41 PM

## 2017-05-01 NOTE — Evaluation (Signed)
Clinical/Bedside Swallow Evaluation Patient Details  Name: Brandon Fuentes MRN: [id number] Date of Birth: 11/26/1990  Today's Date: 05/01/2017 Time: SLP Start Time (ACUTE ONLY): 0920 SLP Stop Time (ACUTE ONLY): 0930 SLP Time Calculation (min) (ACUTE ONLY): 10 min  Past Medical History:  Past Medical History:  Diagnosis Date  . Bipolar 1 disorder (HCC)    Past Surgical History: No past surgical history on file. HPI:  Patient is a 27 y.o male with bipolar type 1 who presented from jail per EMS for altered mental status. Approximately 3-4 days prior to presentation the patient was found to have an elevated lithium level and his lithium was subsequently discontinued. The patient then became combative and altered. On Arrival in the ED he was combative, verbally abusive, and altered requiring sedation. CXR illustrated pneumomediastinum and pneumopericardium. Patient was subsequently intubated for airway protection and transferred to [facility] for evaluation by CT surgery. Esophogram was performed that did not reveal esophageal rupture (not able to locate in chart). Over the course of his hospitalization his pneumomediastinum and pneumopericardium improved and he was extubated on 4/2.   Assessment / Plan / Recommendation Clinical Impression  Difficult assessment due to patient's mentation. Patient alert but extremely confused, verbally and physically agitated, and with very poor awareness and attention. Vocal quality with mild hoarseness and although not able to formally assessed, oral motor function appears normal. Patient refused on po attempts, turning head side to side and becoming increasingly agitated. Despite 5 day intubation, highly suspect that once mentation improves, patient will be able to safely initiate a po diet. SLP will f/u for diagnostic po trials.  SLP Visit Diagnosis: Dysphagia, unspecified (R13.10)    Aspiration Risk       Diet Recommendation NPO   Medication Administration: Via  alternative means    Other  Recommendations Oral Care Recommendations: Oral care QID   Follow up Recommendations (TBD, suspect none)      Frequency and Duration min 2x/week  2 weeks       Prognosis Prognosis for Safe Diet Advancement: Good Barriers to Reach Goals: Cognitive deficits      Swallow Study   General HPI: Patient is a 27 y.o male with bipolar type 1 who presented from jail per EMS for altered mental status. Approximately 3-4 days prior to presentation the patient was found to have an elevated lithium level and his lithium was subsequently discontinued. The patient then became combative and altered. On Arrival in the ED he was combative, verbally abusive, and altered requiring sedation. CXR illustrated pneumomediastinum and pneumopericardium. Patient was subsequently intubated for airway protection and transferred to [facility] for evaluation by CT surgery. Esophogram was performed that did not reveal esophageal rupture (not able to locate in chart). Over the course of his hospitalization his pneumomediastinum and pneumopericardium improved and he was extubated on 4/2. Type of Study: Bedside Swallow Evaluation Previous Swallow Assessment: none Diet Prior to this Study: NPO Temperature Spikes Noted: No Respiratory Status: Room air(non-rebreather removed for eval and O2 stable) History of Recent Intubation: Yes Length of Intubations (days): 5 days Date extubated: 04/30/17 Behavior/Cognition: Alert;Confused;Agitated Oral Cavity Assessment: Within Functional Limits Oral Care Completed by SLP: Yes Oral Cavity - Dentition: Adequate natural dentition Vision: Functional for self-feeding Self-Feeding Abilities: Total assist(due to mentation) Patient Positioning: Upright in bed Baseline Vocal Quality: Hoarse(mild) Volitional Cough: Cognitively unable to elicit Volitional Swallow: Unable to elicit    Oral/Motor/Sensory Function Overall Oral Motor/Sensory Function: Other (comment)(unable  to formally assess, appears [facility])   Ice  Chips Ice chips: Not tested(refused)   Ferdinand LangoLeah Starlett Pehrson MA, CCC-SLP [phone]                 Deren Degrazia Meryl 05/01/2017,10:08 AM
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
## 2017-05-02 ENCOUNTER — Inpatient Hospital Stay (HOSPITAL_COMMUNITY): Payer: 59

## 2017-05-02 LAB — BASIC METABOLIC PANEL
Anion gap: 14 (ref 5–15)
BUN: 8 mg/dL (ref 6–20)
CHLORIDE: 111 mmol/L (ref 101–111)
CO2: 21 mmol/L — ABNORMAL LOW (ref 22–32)
Calcium: 8.7 mg/dL — ABNORMAL LOW (ref 8.9–10.3)
Creatinine, Ser: 1.1 mg/dL (ref 0.61–1.24)
GFR calc Af Amer: >60 mL/min (ref >60)
GFR calc non Af Amer: >60 mL/min (ref >60)
GLUCOSE: 111 mg/dL — AB (ref 65–99)
POTASSIUM: 3.4 mmol/L — AB (ref 3.5–5.1)
Sodium: 146 mmol/L — ABNORMAL HIGH (ref 135–145)

## 2017-05-02 LAB — GLUCOSE, CAPILLARY
GLUCOSE-CAPILLARY: 113 mg/dL — AB (ref 65–99)
GLUCOSE-CAPILLARY: 122 mg/dL — AB (ref 65–99)
Glucose-Capillary: 100 mg/dL — ABNORMAL HIGH (ref 65–99)

## 2017-05-02 LAB — CULTURE, BLOOD (ROUTINE X 2)
Culture: NO GROWTH
Special Requests: ADEQUATE

## 2017-05-02 LAB — CBC
HEMATOCRIT: 39.9 % (ref 39.0–52.0)
Hemoglobin: 13.4 g/dL (ref 13.0–17.0)
MCH: 25.8 pg — ABNORMAL LOW (ref 26.0–34.0)
MCHC: 33.6 g/dL (ref 30.0–36.0)
MCV: 76.7 fL — AB (ref 78.0–100.0)
Platelets: 190 10*3/uL (ref 150–400)
RBC: 5.2 MIL/uL (ref 4.22–5.81)
RDW: 13.8 % (ref 11.5–15.5)
WBC: 10.4 10*3/uL (ref 4.0–10.5)

## 2017-05-02 MED ORDER — LORAZEPAM 2 MG/ML IJ SOLN
2.0000 mg | INTRAMUSCULAR | Status: DC
  Administered 2017-05-02 – 2017-05-10 (×46): 2 mg via INTRAVENOUS
  Filled 2017-05-02 (×46): qty 1

## 2017-05-02 MED ORDER — MIDAZOLAM HCL 2 MG/2ML IJ SOLN
1.0000 mg | INTRAMUSCULAR | Status: DC | PRN
  Administered 2017-05-02 – 2017-05-09 (×13): 1 mg via INTRAVENOUS
  Filled 2017-05-02 (×15): qty 2

## 2017-05-02 MED ORDER — LORAZEPAM 2 MG/ML IJ SOLN
2.0000 mg | Freq: Four times a day (QID) | INTRAMUSCULAR | Status: DC | PRN
  Administered 2017-05-02 (×2): 2 mg via INTRAVENOUS
  Filled 2017-05-02 (×2): qty 1

## 2017-05-02 MED ORDER — SODIUM CHLORIDE 0.9 % IV SOLN
INTRAVENOUS | Status: DC
  Administered 2017-05-02 (×2): via INTRAVENOUS

## 2017-05-02 MED ORDER — LITHIUM CITRATE 300 MG/5 ML PO SYRP
300.0000 mg | Freq: Two times a day (BID) | ORAL | Status: DC
  Administered 2017-05-02 – 2017-05-03 (×3): 300 mg
  Filled 2017-05-02 (×3): qty 5

## 2017-05-02 MED ORDER — POTASSIUM CHLORIDE 10 MEQ/100ML IV SOLN
10.0000 meq | INTRAVENOUS | Status: AC
  Administered 2017-05-02 (×4): 10 meq via INTRAVENOUS
  Filled 2017-05-02 (×4): qty 100

## 2017-05-02 MED ORDER — SODIUM CHLORIDE 0.9 % IV SOLN
INTRAVENOUS | Status: DC
  Administered 2017-05-02: 15:00:00 via INTRAVENOUS
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
## 2017-05-02 NOTE — Progress Notes (Signed)
SLP Cancellation Note  Patient Details Name: Brandon Fuentes MRN: [id number] DOB: 1990-02-27   Cancelled treatment:       Reason Eval/Treat Not Completed: Medical issues which prohibited therapy  Ferdinand LangoLeah Swayze Kozuch MA, CCC-SLP [phone]  Ferdinand LangoMcCoy [name] Meryl 05/02/2017, 4:28 PM
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
## 2017-05-02 NOTE — Progress Notes (Signed)
Nutrition Follow-up  DOCUMENTATION CODES:   Obesity unspecified  INTERVENTION:    If unable to begin PO diet within the next 24-48 hours, consider starting TF via NGT.  Jevity 1.2 at 85 ml/h would provide 2448 kcal, 113 gm protein, 1652 ml free water daily.  NUTRITION DIAGNOSIS:   Inadequate oral intake related to lethargy/confusion as evidenced by NPO status.  Ongoing  GOAL:   Patient will meet greater than or equal to 90% of their needs  Unmet  MONITOR:   Labs, I & O's, Diet advancement  ASSESSMENT:   Pt with PMH of Bipolar type 1 disease with recent cessation of Lithium (levels WNL on admission) admitted from jail with pneumomediastinum and pneumopericardium.   Discussed patient in ICU rounds and with RN today. Extubated 4/2. TF off / NPO since extubation. Patient agitated and combative when awake. Lithium being restarted today after NGT placed by RN. Hopeful for mental status return to baseline to begin PO medications and food. Labs reviewed. Sodium 146 (H), potassium 3.4 (L) CBG's: 102-100-113 Medications reviewed and include KCl, Lithium, Precedex, Ativan.    Diet Order:  No diet orders on file  EDUCATION NEEDS:   No education needs have been identified at this time  Skin:  Skin Assessment: Reviewed RN Assessment  Last BM:  3/29  Height:   Ht Readings from Last 1 Encounters:  04/27/17 6\' 1"  (1.854 m)    Weight:   Wt Readings from Last 1 Encounters:  05/02/17 298 lb 4.5 oz (135.3 kg)    Ideal Body Weight:  83.6 kg  BMI:  Body mass index is 39.35 kg/m.  Estimated Nutritional Needs:   Kcal:  2400-2600  Protein:  120-140 gm  Fluid:  2.5 L    Joaquin CourtsKimberly Harris, RD, LDN, CNSC Pager [phone] After Hours Pager [phone]

## 2017-05-03 ENCOUNTER — Inpatient Hospital Stay (HOSPITAL_COMMUNITY): Payer: 59

## 2017-05-03 LAB — BASIC METABOLIC PANEL
ANION GAP: 14 (ref 5–15)
ANION GAP: 13 (ref 5–15)
BUN: 10 mg/dL (ref 6–20)
BUN: 12 mg/dL (ref 6–20)
CALCIUM: 8.4 mg/dL — AB (ref 8.9–10.3)
CHLORIDE: 109 mmol/L (ref 101–111)
CO2: 21 mmol/L — AB (ref 22–32)
CO2: 21 mmol/L — ABNORMAL LOW (ref 22–32)
Calcium: 9 mg/dL (ref 8.9–10.3)
Chloride: 108 mmol/L (ref 101–111)
Creatinine, Ser: 1.02 mg/dL (ref 0.61–1.24)
Creatinine, Ser: 0.98 mg/dL (ref 0.61–1.24)
GFR calc Af Amer: >60 mL/min (ref >60)
GFR calc non Af Amer: >60 mL/min (ref >60)
Glucose, Bld: 105 mg/dL — ABNORMAL HIGH (ref 65–99)
Glucose, Bld: 110 mg/dL — ABNORMAL HIGH (ref 65–99)
POTASSIUM: 4 mmol/L (ref 3.5–5.1)
Potassium: 3.2 mmol/L — ABNORMAL LOW (ref 3.5–5.1)
SODIUM: 143 mmol/L (ref 135–145)
Sodium: 143 mmol/L (ref 135–145)

## 2017-05-03 LAB — CBC
HCT: 40.3 % (ref 39.0–52.0)
Hemoglobin: 13.5 g/dL (ref 13.0–17.0)
MCH: 25.6 pg — AB (ref 26.0–34.0)
MCHC: 33.5 g/dL (ref 30.0–36.0)
MCV: 76.3 fL — AB (ref 78.0–100.0)
Platelets: 214 10*3/uL (ref 150–400)
RBC: 5.28 MIL/uL (ref 4.22–5.81)
RDW: 13.7 % (ref 11.5–15.5)
WBC: 9.3 10*3/uL (ref 4.0–10.5)

## 2017-05-03 MED ORDER — POTASSIUM CHLORIDE 10 MEQ/100ML IV SOLN
10.0000 meq | INTRAVENOUS | Status: AC
  Administered 2017-05-03 (×6): 10 meq via INTRAVENOUS
  Filled 2017-05-03 (×6): qty 100

## 2017-05-03 MED ORDER — SENNOSIDES 8.8 MG/5ML PO SYRP
5.0000 mL | ORAL_SOLUTION | Freq: Every day | ORAL | Status: DC
  Administered 2017-05-03 – 2017-05-10 (×4): 5 mL
  Filled 2017-05-03 (×11): qty 5

## 2017-05-03 MED ORDER — ONDANSETRON HCL 4 MG/2ML IJ SOLN
4.0000 mg | Freq: Four times a day (QID) | INTRAMUSCULAR | Status: DC | PRN
  Administered 2017-05-03 – 2017-05-04 (×2): 4 mg via INTRAVENOUS
  Filled 2017-05-03 (×2): qty 2

## 2017-05-03 MED ORDER — DOCUSATE SODIUM 50 MG/5ML PO LIQD: 100.0000 mg | Freq: Two times a day (BID) | ORAL | Status: DC

## 2017-05-03 MED ORDER — POTASSIUM CHLORIDE 20 MEQ/15ML (10%) PO SOLN: 40.0000 meq | Freq: Two times a day (BID) | ORAL | Status: DC

## 2017-05-03 MED ORDER — POTASSIUM CHLORIDE 2 MEQ/ML IV SOLN
INTRAVENOUS | Status: DC
  Administered 2017-05-03 – 2017-05-07 (×9): via INTRAVENOUS
  Filled 2017-05-03 (×19): qty 1000

## 2017-05-03 MED ORDER — POLYETHYLENE GLYCOL 3350 17 G PO PACK
17.0000 g | PACK | Freq: Every day | ORAL | Status: DC
  Administered 2017-05-03 – 2017-05-10 (×4): 17 g via ORAL
  Filled 2017-05-03 (×10): qty 1

## 2017-05-03 MED ORDER — LITHIUM CITRATE 300 MG/5 ML PO SYRP
300.0000 mg | Freq: Three times a day (TID) | ORAL | Status: DC
  Administered 2017-05-03 – 2017-05-11 (×22): 300 mg
  Filled 2017-05-03 (×25): qty 5

## 2017-05-03 NOTE — Progress Notes (Signed)
PULMONARY  / CRITICAL CARE MEDICINE  Name: Brandon Fuentes MRN: [id number] DOB: 11-06-1990    LOS: 7  REFERRING MD : EDP [facility]  CHIEF COMPLAINT:  Agitation and combative behavior   BRIEF PATIENT DESCRIPTION: Patient is a 27 y.o male with bipolar type 1 who presented from jail per EMS for altered mental status. Approximately 3-4 days prior to presentation the patient was found to have an elevated lithium level and his lithium was subsequently discontinued. The patient then became combative and altered. On Arrival in the ED he was combative, verbally abusive, and altered requiring sedation. CXR illustrated pneumomediastinum and pneumopericardium. Patient was subsequently intubated for airway protection and transferred to [facility] for evaluation by CT surgery. Esophogram was performed that did not reveal esophageal rupture. Over the course of his hospitalization his pneumomediastinum and pneumopericardium improved and he was extubated on 4/2.   LINES / TUBES: Bilateral peripheral IVs  Urinary Catheter  Extubated on 4/2 NG tube 4/4  CULTURES: Blood cultures 3/29 with NGTD Urine cultures 3/29 with NGTD  ANTIBIOTICS: Vancomycin x 1 on 3/29  Zosyn 3/29 -> current  INTERVAL HISTORY:  Patient did well overnight. No major issues. Did have an episode of emesis. NG tube remains in place.   VITAL SIGNS: Temp:  [97.6 F (36.4 C)-99.2 F (37.3 C)] 97.6 F (36.4 C) (04/05 0303) Pulse Rate:  [43-87] 61 (04/05 0700) Resp:  [13-33] 22 (04/05 0700) BP: (117-150)/(77-106) 120/105 (04/05 0700) SpO2:  [89 %-98 %] 98 % (04/05 0700) FiO2 (%):  [50 %] 50 % (04/04 1200) Weight:  [295 lb 3.1 oz (133.9 kg)] 295 lb 3.1 oz (133.9 kg) (04/05 0400)   VENTILATOR SETTINGS: FiO2 (%):  [50 %] 50 %   INTAKE / OUTPUT: Intake/Output      04/04 0701 - 04/05 0700 04/05 0701 - 04/06 0700   I.V. (mL/kg) 2388.4 (17.8)    NG/GT 60    IV Piggyback 450    Total Intake(mL/kg) 2898.4 (21.6)    Urine (mL/kg/hr) 2335  (0.7)    Emesis/NG output 0    Total Output 2335    Net +563.4         Emesis Occurrence 1 x     PHYSICAL EXAMINATION:  General: Obese gentleman resting in bed Neuro: Sedated, not arousable  HEENT: Normocephalic, atraumatic, moist mucus membranes Cardiovascular: RRR, no murmurs, no rubs  Lungs: Good air movement with no wheezing or crackles  Abdomen: Hypoactive bowel sounds, soft, no tenderness to palpation  Musculoskeletal: Soft restraints in place, no LE edema  Skin: Warm and dry   LABS: Cbc Recent Labs  Lab 05/01/17 2016 05/02/17 0312 05/03/17 0423  WBC 10.1 10.4 9.3  HGB 13.6 13.4 13.5  HCT 40.8 39.9 40.3  PLT 180 190 214   Chemistry Recent Labs  Lab 04/29/17 0555 04/30/17 0302  05/01/17 0452 05/02/17 0312 05/03/17 0423  NA 148*  --    < > 148* 146* 143  K 3.0*  --    < > 3.6 3.4* 3.2*  CL 116*  --    < > 111 111 108  CO2 27  --    < > 25 21* 21*  BUN 12  --    < > 9 8 10   CREATININE 0.93  --    < > 0.90 1.10 1.02  CALCIUM 7.7*  --    < > 9.1 8.7* 8.4*  MG 1.9 1.8  --  1.7  --   --   PHOS  3.6 3.7  --  3.0  --   --   GLUCOSE 91  --    < > 102* 111* 105*   < > = values in this interval not displayed.   Liver fxn Recent Labs  Lab 04/26/17 1527 04/28/17 0225  AST 421* 140*  ALT 145* 85*  ALKPHOS 58 47  BILITOT 2.6* 1.5*  PROT 8.3* 5.9*  ALBUMIN 5.0 3.3*   coags No results for input(s): APTT, INR in the last 168 hours.   Sepsis markers Recent Labs  Lab 04/27/17 0227 04/27/17 0228 04/27/17 0819 04/28/17 0225 04/29/17 0555  LATICACIDVEN  --  1.2 1.5 1.2  --   PROCALCITON <0.10  --   --  <0.10 <0.10   Cardiac markers Recent Labs  Lab 04/28/17 0225 04/29/17 0555 05/01/17 2016  CKTOTAL 5,459* 1,647* 472*   BNP No results for input(s): PROBNP in the last 168 hours.   ABG Recent Labs  Lab 04/26/17 1607 04/26/17 2133 04/29/17 1347 04/30/17 1049  PHART  --  7.343* 7.303* 7.326*  PCO2ART  --  40.5 59.5* 54.2*  PO2ART  --  339* 71.6*  77.0*  HCO3  --  21.6 28.6* 28.3*  TCO2 23  --   --  30   CBG trend Recent Labs  Lab 05/01/17 1919 05/01/17 2328 05/02/17 0343 05/02/17 0726 05/02/17 1124  GLUCAP 115* 102* 100* 113* 122*   DIAGNOSES: Principal Problem:   Altered mental status Active Problems:   Acute delirium   Pneumomediastinum (HCC)   Pneumopericardium   Elevated LFTs   Endotracheally intubated   Respiratory failure (HCC)  ASSESSMENT / PLAN: Patient is a 27 y.o male with bipolar type 1 who presented from jail per EMS for altered mental status. Subsequently found to have pneumomediastinum and pneumopericardium of uncertain etiology with multiple negative CT chest scans and esophagram. Successfully extubated on 04/02.   NEUROLOGIC A: AMS Bipolar Type 1 H/o NMS  P:   Psychiatry following  Wean the Precedex  Has Ativan 2mg  every 4 hours for agitation Versed PRN NG tube placed on 4/4 and started lithium 300 mg BID Soft restraints in place  PULMONARY A: Pneumomediastinum of unknown etiology  Respiratory failure   P:   Negative esophagram  Extubated on 04/02, hemodynamically stable on NRB CXR today illustrating no air. NG tube in place.   CARDIOVASCULAR A: Pneumopericardium of unknown etiology   P:  CXR today illustrating no free air and NG tube in place  INFECTIOUS A: Concern for aspiration/esophageal rupture   P:   Fever curve flat and leukocytosis resolved  Zosyn day 7/7  GASTROINTESTINAL A:  ?Esophageal rupture  Ileus   P:   Negative CT chest with NG contrast  Continuing to monitor pneumomediastinum and pneumopericardium  NG tube placed on 4/4.  Will start tube feeds. Jevity 1.2 at 85 ml/h would provide 2448 kcal, 113 gm protein, 1652 ml free water daily per nutrition.  Maintain K > 4.0   RENAL A: Acute Kidney Injury  HAGMA Hypokalemia   P:   Resolved, continue to monitor  Replacing hypokalemia   Pulmonary and Critical Care Medicine [facility] Pager: [phone]  [phone]  05/03/2017, 7:42 AM

## 2017-05-03 NOTE — Progress Notes (Signed)
CSW continues to follow pt for any further needs. CSW aware that pt is no won room air. CSW will remain involved for support to pt and any other family members at this time.   Claude MangesKierra S. Quay Simkin, MSW, LCSW-A Emergency Department Clinical Social Worker [phone]
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
## 2017-05-03 NOTE — Progress Notes (Signed)
  Speech Language Pathology Treatment: Dysphagia  Patient Details Name: Brandon Fuentes MRN: [id number] DOB: [birth year] Today's Date: 05/03/2017 Time: [id number] SLP Time Calculation (min) (ACUTE ONLY): 10 min  Assessment / Plan / Recommendation Clinical Impression  Pt continues to present with an increased risk for aspiration due to current mentation, although today he did orally accept minimal amounts of POs. Despite oral acceptance, pt orally held ice chips and thin liquids despite Max cues until bolus was orally suctioned. Pt stopped allowing SLP to suction, clenching his teeth, but still not swallowing. This elicited an immediate cough concerning for spillage into the airway but also suggestive of good sensation. His vocal quality is clear today. Recommend that he remain NPO for now but anticipate good prognosis for return to POs as his mentation clears. To allow time for improvement will plan to follow up early next week. Please page the weekend pager [phone] over the weekend should he be ready sooner.   HPI HPI: Patient is a 27 y.o male with bipolar type 1 who presented from jail per EMS for altered mental status. Approximately 3-4 days prior to presentation the patient was found to have an elevated lithium level and his lithium was subsequently discontinued. The patient then became combative and altered. On Arrival in the ED he was combative, verbally abusive, and altered requiring sedation. CXR illustrated pneumomediastinum and pneumopericardium. Patient was subsequently intubated for airway protection and [facility]. Esophogram was performed that did not reveal esophageal rupture (not able to locate in chart). Over the course of his hospitalization his pneumomediastinum and pneumopericardium improved and he was extubated on 4/2.      SLP Plan  Continue with current plan of care       Recommendations  Diet recommendations: NPO Medication  Administration: Via alternative means                Oral Care Recommendations: Oral care QID Follow up Recommendations: (tba, none anticipated) SLP Visit Diagnosis: Dysphagia, unspecified (R13.10) Plan: Continue with current plan of care       GO                Brandon Fuentes, Shaarav Ripple 05/03/2017, 9:30 AM  Brandon HamLaura Fuentes, M.A. CCC-SLP [phone]
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
## 2017-05-04 LAB — BASIC METABOLIC PANEL
ANION GAP: 10 (ref 5–15)
BUN: 12 mg/dL (ref 6–20)
CHLORIDE: 109 mmol/L (ref 101–111)
CO2: 24 mmol/L (ref 22–32)
CREATININE: 1.07 mg/dL (ref 0.61–1.24)
Calcium: 9 mg/dL (ref 8.9–10.3)
GFR calc Af Amer: >60 mL/min (ref >60)
GFR calc non Af Amer: >60 mL/min (ref >60)
GLUCOSE: 103 mg/dL — AB (ref 65–99)
POTASSIUM: 4 mmol/L (ref 3.5–5.1)
Sodium: 143 mmol/L (ref 135–145)

## 2017-05-04 LAB — CBC
HCT: 40.3 % (ref 39.0–52.0)
HEMOGLOBIN: 13 g/dL (ref 13.0–17.0)
MCH: 24.9 pg — ABNORMAL LOW (ref 26.0–34.0)
MCHC: 32.3 g/dL (ref 30.0–36.0)
MCV: 77.2 fL — AB (ref 78.0–100.0)
Platelets: 221 10*3/uL (ref 150–400)
RBC: 5.22 MIL/uL (ref 4.22–5.81)
RDW: 14 % (ref 11.5–15.5)
WBC: 7 10*3/uL (ref 4.0–10.5)

## 2017-05-04 MED ORDER — GABAPENTIN 250 MG/5ML PO SOLN
300.0000 mg | Freq: Three times a day (TID) | ORAL | Status: DC
  Administered 2017-05-04 – 2017-05-14 (×20): 300 mg via ORAL
  Filled 2017-05-04 (×32): qty 6

## 2017-05-04 MED ORDER — GABAPENTIN 300 MG PO CAPS
300.0000 mg | ORAL_CAPSULE | Freq: Three times a day (TID) | ORAL | Status: DC
  Administered 2017-05-04 (×2): 300 mg via ORAL
  Filled 2017-05-04 (×3): qty 1

## 2017-05-04 NOTE — Progress Notes (Signed)
PULMONARY  / CRITICAL CARE MEDICINE  Name: Brandon AuerSherwood Ator MRN: [id number] DOB: [birth year]    LOS: 8  REFERRING MD : [facility]  CHIEF COMPLAINT:  Agitation and combative behavior   BRIEF PATIENT DESCRIPTION: Patient is a 27 y.o male with bipolar type 1 who presented from jail per EMS for altered mental status. Approximately 3-4 days prior to presentation the patient was found to have an elevated lithium level and his lithium was subsequently discontinued. The patient then became combative and altered. On Arrival in the ED he was combative, verbally abusive, and altered requiring sedation. CXR illustrated pneumomediastinum and pneumopericardium. Patient was subsequently intubated for airway protection and [facility]. Esophogram was performed that did not reveal esophageal rupture. Over the course of his hospitalization his pneumomediastinum and pneumopericardium improved and he was extubated on 4/2.   LINES / TUBES: Bilateral peripheral IVs  Urinary Catheter  Extubated on 4/2 NG tube 4/4  CULTURES: Blood cultures 3/29 with NGTD Urine cultures 3/29 with NGTD  ANTIBIOTICS: Vancomycin x 1 on 3/29  Zosyn 3/29 -> 4/5  INTERVAL HISTORY:  No major issues Lithium dose increased yesterday  VITAL SIGNS: Temp:  [98 F (36.7 C)-98.9 F (37.2 C)] 98 F (36.7 C) (04/06 0324) Pulse Rate:  [42-69] 44 (04/06 0600) Resp:  [13-39] 25 (04/06 0600) BP: (123-151)/(83-106) 134/86 (04/06 0600) SpO2:  [91 %-100 %] 95 % (04/06 0600) Weight:  [292 lb 8.8 oz (132.7 kg)] 292 lb 8.8 oz (132.7 kg) (04/06 0334)   VENTILATOR SETTINGS:     INTAKE / OUTPUT: Intake/Output      04/05 0701 - 04/06 0700 04/06 0701 - 04/07 0700   I.V. (mL/kg) 2648.8 (20)    NG/GT     IV Piggyback 300    Total Intake(mL/kg) 2948.8 (22.2)    Urine (mL/kg/hr) 3115 (1)    Emesis/NG output     Total Output 3115    Net -166.2          PHYSICAL EXAMINATION:  Gen:      No acute  distress, obese HEENT:  EOMI, sclera anicteric Neck:     No masses; no thyromegaly Lungs:    Clear to auscultation bilaterally; normal respiratory effort CV:         Regular rate and rhythm; no murmurs Abd:      + bowel sounds; soft, non-tender; no palpable masses, no distension Ext:    No edema; adequate peripheral perfusion Skin:      Warm and dry; no rash Neuro: Awake, confused   LABS: Cbc Recent Labs  Lab 05/02/17 0312 05/03/17 0423 05/04/17 0438  WBC 10.4 9.3 7.0  HGB 13.4 13.5 13.0  HCT 39.9 40.3 40.3  PLT 190 214 221   Chemistry Recent Labs  Lab 04/29/17 0555 04/30/17 0302  05/01/17 0452  05/03/17 0423 05/03/17 1619 05/04/17 0438  NA 148*  --    < > 148*   < > 143 143 143  K 3.0*  --    < > 3.6   < > 3.2* 4.0 4.0  CL 116*  --    < > 111   < > 108 109 109  CO2 27  --    < > 25   < > 21* 21* 24  BUN 12  --    < > 9   < > 10 12 12   CREATININE 0.93  --    < > 0.90   < >  1.02 0.98 1.07  CALCIUM 7.7*  --    < > 9.1   < > 8.4* 9.0 9.0  MG 1.9 1.8  --  1.7  --   --   --   --   PHOS 3.6 3.7  --  3.0  --   --   --   --   GLUCOSE 91  --    < > 102*   < > 105* 110* 103*   < > = values in this interval not displayed.   Liver fxn Recent Labs  Lab 04/28/17 0225  AST 140*  ALT 85*  ALKPHOS 47  BILITOT 1.5*  PROT 5.9*  ALBUMIN 3.3*   coags No results for input(s): APTT, INR in the last 168 hours.   Sepsis markers Recent Labs  Lab 04/27/17 0819 04/28/17 0225 04/29/17 0555  LATICACIDVEN 1.5 1.2  --   PROCALCITON  --  <0.10 <0.10   Cardiac markers Recent Labs  Lab 04/28/17 0225 04/29/17 0555 05/01/17 2016  CKTOTAL 5,459* 1,647* 472*   BNP No results for input(s): PROBNP in the last 168 hours.   ABG Recent Labs  Lab 04/29/17 1347 04/30/17 1049  PHART 7.303* 7.326*  PCO2ART 59.5* 54.2*  PO2ART 71.6* 77.0*  HCO3 28.6* 28.3*  TCO2  --  30   CBG trend Recent Labs  Lab 05/01/17 1919 05/01/17 2328 05/02/17 0343 05/02/17 0726 05/02/17 1124   GLUCAP 115* 102* 100* 113* 122*   DIAGNOSES: Principal Problem:   Altered mental status Active Problems:   Acute delirium   Pneumomediastinum (HCC)   Pneumopericardium   Elevated LFTs   Endotracheally intubated   Respiratory failure ([facility])  ASSESSMENT / PLAN: Patient is a 27 y.o male with bipolar type 1 who presented from jail per EMS for altered mental status. Subsequently found to have pneumomediastinum and pneumopericardium of uncertain etiology with multiple negative CT chest scans and esophagram. Successfully extubated on 04/02.   NEUROLOGIC A: AMS Bipolar Type 1 H/o NMS  P:   Appreciate psychiatry recommendation Lithium dose increased yesterday.  Check levels in 5 days Start gabapentin 3 times daily Wean Precedex Ativan as needed  PULMONARY A: Pneumomediastinum of unknown etiology  Respiratory failure   P:   Stable chest x-ray.  CARDIOVASCULAR A: Pneumopericardium of unknown etiology   P:  Resolved on chest x-ray.  Continue monitoring.  INFECTIOUS A: Concern for aspiration/esophageal rupture   P:   Fever curve flat and leukocytosis resolved  Finished Zosyn  GASTROINTESTINAL A:    Ileus   P:   Holding tube feeds given ileus Recheck abdominal x-ray  RENAL A: Acute Kidney Injury  HAGMA Hypokalemia > resolved  P:   Follow BMET  The patient is critically ill with multiple organ system failure and requires high complexity decision making for assessment and support, frequent evaluation and titration of therapies, advanced monitoring, review of radiographic studies and interpretation of complex data.   Critical Care Time devoted to patient care services, exclusive of separately billable procedures, described in this note is 35 minutes.   Chilton Greathouse MD [facility] Pager [phone] If no answer or after 3pm call: [phone] 05/04/2017, 7:28 AM
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
## 2017-05-05 LAB — BASIC METABOLIC PANEL
Anion gap: 12 (ref 5–15)
BUN: 12 mg/dL (ref 6–20)
CO2: 22 mmol/L (ref 22–32)
Calcium: 9.2 mg/dL (ref 8.9–10.3)
Chloride: 107 mmol/L (ref 101–111)
Creatinine, Ser: 1.04 mg/dL (ref 0.61–1.24)
GFR calc Af Amer: >60 mL/min (ref >60)
Glucose, Bld: 106 mg/dL — ABNORMAL HIGH (ref 65–99)
POTASSIUM: 3.8 mmol/L (ref 3.5–5.1)
SODIUM: 141 mmol/L (ref 135–145)

## 2017-05-05 MED ORDER — POTASSIUM CHLORIDE 10 MEQ/100ML IV SOLN
10.0000 meq | INTRAVENOUS | Status: AC
  Administered 2017-05-05 (×3): 10 meq via INTRAVENOUS
  Filled 2017-05-05: qty 100

## 2017-05-05 MED ORDER — HYDROXYZINE HCL 25 MG PO TABS
50.0000 mg | ORAL_TABLET | Freq: Once | ORAL | Status: DC
  Filled 2017-05-05: qty 2

## 2017-05-05 MED ORDER — HYDROXYZINE HCL 10 MG/5ML PO SYRP
50.0000 mg | ORAL_SOLUTION | Freq: Once | ORAL | Status: AC
  Administered 2017-05-06: 50 mg via ORAL
  Filled 2017-05-05: qty 25
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
## 2017-05-05 NOTE — Progress Notes (Signed)
[employer] noted pt hand out of safety mitten, staff notified by [employer]. Staff arrived to room to find pt with increased agitation, pt directed statements towards this RN that "I remind him of his mother" and  "I am not here to help him". Pt expressing comments of wanting to be hugged and to see his mother.

## 2017-05-05 NOTE — Progress Notes (Signed)
Dr Arsenio LoaderSommer returned call, informed md pt ngt remains out, pt with no complaints of abd discomfort or n/v, abdomen soft obese positive bowel sounds. Will leave ngt out for now. May give meds with sips of water per md instructions.

## 2017-05-05 NOTE — Progress Notes (Addendum)
Pt refusing lithium and gabapentin. Pt pt asking for hydroxyzine, pt does not provide name of prescriber when asked but [facility]
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
## 2017-05-05 NOTE — Progress Notes (Signed)
PULMONARY  / CRITICAL CARE MEDICINE  Name: Brandon Fuentes MRN: [id number] DOB: 1990-07-31    LOS: 9  REFERRING MD : EDP Wonda Olds  CHIEF COMPLAINT:  Agitation and combative behavior   BRIEF PATIENT DESCRIPTION: Patient is a 27 y.o male with bipolar type 1 who presented from jail per EMS for altered mental status. Approximately 3-4 days prior to presentation the patient was found to have an elevated lithium level and his lithium was subsequently discontinued. The patient then became combative and altered. On Arrival in the ED he was combative, verbally abusive, and altered requiring sedation. CXR illustrated pneumomediastinum and pneumopericardium. Patient was subsequently intubated for airway protection and transferred to [facility] for evaluation by CT surgery. Esophogram was performed that did not reveal esophageal rupture. Over the course of his hospitalization his pneumomediastinum and pneumopericardium improved and he was extubated on 4/2.   LINES / TUBES: Right hand and arm peripheral IVs  Urinary Catheter  Extubated on 4/2 NG tube came out on 4/6  CULTURES: Blood cultures 3/29 with NGTD Urine cultures 3/29 with NGTD  ANTIBIOTICS: Vancomycin x 1 on 3/29  Zosyn 3/29 -> current  INTERVAL HISTORY:  NG tube came out overnight.  Continues to be agitated and have flight of ideas.  Taking his meds intermittently.  VITAL SIGNS: Temp:  [97.9 F (36.6 C)-99.2 F (37.3 C)] 97.9 F (36.6 C) (04/07 0352) Pulse Rate:  [44-151] 91 (04/06 2000) Resp:  [13-45] 15 (04/06 2000) BP: (106-141)/(59-127) 110/59 (04/06 2000) SpO2:  [94 %-100 %] 94 % (04/06 2000)   VENTILATOR SETTINGS:     INTAKE / OUTPUT: Intake/Output      04/06 0701 - 04/07 0700   I.V. (mL/kg) 2212.2 (16.7)   NG/GT 30   Total Intake(mL/kg) 2242.2 (16.9)   Urine (mL/kg/hr) 1850 (0.6)   Emesis/NG output 850   Stool 0   Total Output 2700   Net -457.8       Stool Occurrence 2 x    PHYSICAL EXAMINATION:  General:  Obese gentleman resting in bed Neuro: Arousable with flight of ideas. HEENT: Normocephalic, atraumatic, moist mucus membranes Cardiovascular: RRR, no murmurs, no rubs  Lungs: Good air movement with no wheezing or crackles  Abdomen: Hypoactive bowel sounds, soft, no tenderness to palpation  Musculoskeletal: Soft restraints in place, no LE edema  Skin: Warm and dry   LABS: Cbc Recent Labs  Lab 05/02/17 0312 05/03/17 0423 05/04/17 0438  WBC 10.4 9.3 7.0  HGB 13.4 13.5 13.0  HCT 39.9 40.3 40.3  PLT 190 214 221   Chemistry Recent Labs  Lab 04/29/17 0555 04/30/17 0302  05/01/17 0452  05/03/17 0423 05/03/17 1619 05/04/17 0438  NA 148*  --    < > 148*   < > 143 143 143  K 3.0*  --    < > 3.6   < > 3.2* 4.0 4.0  CL 116*  --    < > 111   < > 108 109 109  CO2 27  --    < > 25   < > 21* 21* 24  BUN 12  --    < > 9   < > 10 12 12   CREATININE 0.93  --    < > 0.90   < > 1.02 0.98 1.07  CALCIUM 7.7*  --    < > 9.1   < > 8.4* 9.0 9.0  MG 1.9 1.8  --  1.7  --   --   --   --  PHOS 3.6 3.7  --  3.0  --   --   --   --   GLUCOSE 91  --    < > 102*   < > 105* 110* 103*   < > = values in this interval not displayed.   Liver fxn No results for input(s): AST, ALT, ALKPHOS, BILITOT, PROT, ALBUMIN in the last 168 hours.   coags No results for input(s): APTT, INR in the last 168 hours.   Sepsis markers Recent Labs  Lab 04/29/17 0555  PROCALCITON <0.10   Cardiac markers Recent Labs  Lab 04/29/17 0555 05/01/17 2016  CKTOTAL 1,647* 472*   BNP No results for input(s): PROBNP in the last 168 hours.   ABG Recent Labs  Lab 04/29/17 1347 04/30/17 1049  PHART 7.303* 7.326*  PCO2ART 59.5* 54.2*  PO2ART 71.6* 77.0*  HCO3 28.6* 28.3*  TCO2  --  30   CBG trend Recent Labs  Lab 05/01/17 1919 05/01/17 2328 05/02/17 0343 05/02/17 0726 05/02/17 1124  GLUCAP 115* 102* 100* 113* 122*   DIAGNOSES: Principal Problem:   Altered mental status Active Problems:   Acute delirium    Pneumomediastinum (HCC)   Pneumopericardium   Elevated LFTs   Endotracheally intubated   Respiratory failure (HCC)  ASSESSMENT / PLAN: Patient is a 27 y.o male with bipolar type 1 who presented from jail per EMS for altered mental status. Subsequently found to have pneumomediastinum and pneumopericardium of uncertain etiology with multiple negative CT chest scans and esophagram. Successfully extubated on 04/02.   NEUROLOGIC A: AMS Bipolar Type 1 H/o NMS  P:   Appreciate psychiatry recommendation Wean the Precedex  Lithium 300 mg TID. Check on 04/09 Has Ativan 2mg  every 4 hours for agitation Soft restraints in place May need inpatient psychiatry   PULMONARY A: Pneumomediastinum of unknown etiology  Respiratory failure   P:   Stable on CXR.  CARDIOVASCULAR A: Pneumopericardium of unknown etiology   P:  Resolved. Continue to monitor.  INFECTIOUS A: Concern for aspiration/esophageal rupture   P:   Fever curve flat and leukocytosis resolved.  7 day course of zosyn complete.  GASTROINTESTINAL A:  ?Esophageal rupture  Ileus   P:   NG tube out overnight. Unable to replace it last night.  Follow BMET, maintain K >4.0   RENAL A: Acute Kidney Injury  HAGMA Hypokalemia   P:   Follow BMET  Pulmonary and Critical Care Medicine [facility] Pager: [phone]  05/05/2017, 5:34 AM

## 2017-05-06 ENCOUNTER — Inpatient Hospital Stay (HOSPITAL_COMMUNITY): Payer: 59

## 2017-05-06 DIAGNOSIS — G9340 Encephalopathy, unspecified: Secondary | ICD-10-CM

## 2017-05-06 LAB — BASIC METABOLIC PANEL
Anion gap: 12 (ref 5–15)
BUN: 9 mg/dL (ref 6–20)
CALCIUM: 9.2 mg/dL (ref 8.9–10.3)
CO2: 22 mmol/L (ref 22–32)
CREATININE: 1.05 mg/dL (ref 0.61–1.24)
Chloride: 103 mmol/L (ref 101–111)
GLUCOSE: 107 mg/dL — AB (ref 65–99)
Potassium: 3.8 mmol/L (ref 3.5–5.1)
Sodium: 137 mmol/L (ref 135–145)

## 2017-05-06 MED ORDER — POTASSIUM CHLORIDE 10 MEQ/100ML IV SOLN
10.0000 meq | INTRAVENOUS | Status: AC
  Administered 2017-05-06 (×3): 10 meq via INTRAVENOUS
  Filled 2017-05-06 (×3): qty 100

## 2017-05-06 MED ORDER — IOPAMIDOL (ISOVUE-300) INJECTION 61%
150.0000 mL | Freq: Once | INTRAVENOUS | Status: AC | PRN
  Administered 2017-05-06: 25 mL via ORAL

## 2017-05-06 MED ORDER — IOPAMIDOL (ISOVUE-300) INJECTION 61%
INTRAVENOUS | Status: AC
  Administered 2017-05-06: 25 mL via ORAL
  Filled 2017-05-06: qty 150

## 2017-05-06 NOTE — Progress Notes (Signed)
  Speech Language Pathology Treatment: Dysphagia  Patient Details Name: Brandon Fuentes MRN: [id number] DOB: 1990/05/07 Today's Date: 05/06/2017 Time: [id number] SLP Time Calculation (min) (ACUTE ONLY): 19 min  Assessment / Plan / Recommendation Clinical Impression  Pt presents with Mod oral phase deficits with puree and regular solid trials, including bolus holding and reduced manipulation. Pt required Max visual and verbal cues to facilitate sustained attention to task of eating, minimize talking during trials, and ensure eventual swallow of bolus. No oral phase deficits observed with liquid trials. Pt had no overt signs concerning for aspiration for all trials; however, oral deficits and altered mental status increases risk of aspiration. Therefore, recommend starting pt on full thin liquid diet initially, with medications crushed in applesauce (or in liquid form) and full supervision. Pt will likely be able to advance to solid consistencies with improved mentation. SLP will continue to follow to monitor for pt's readiness to advance diet.      HPI HPI: Patient is a 27 y.o male with bipolar type 1 who presented from jail per EMS for altered mental status. Approximately 3-4 days prior to presentation the patient was found to have an elevated lithium level and his lithium was subsequently discontinued. The patient then became combative and altered. On Arrival in the ED he was combative, verbally abusive, and altered requiring sedation. CXR illustrated pneumomediastinum and pneumopericardium. Patient was subsequently intubated for airway protection and [facility]. Esophogram was performed that did not reveal esophageal rupture (not able to locate in chart). Over the course of his hospitalization his pneumomediastinum and pneumopericardium improved and he was extubated on 4/2.      SLP Plan  Continue with current plan of care       Recommendations  Diet  recommendations: Thin liquid Liquids provided via: Straw;Cup Medication Administration: Crushed with puree Supervision: Full supervision/cueing for compensatory strategies;Staff to assist with self feeding Compensations: Minimize environmental distractions;Slow rate;Small sips/bites Postural Changes and/or Swallow Maneuvers: Seated upright 90 degrees                Oral Care Recommendations: Oral care BID Follow up Recommendations: Other (comment)(tbd) SLP Visit Diagnosis: Dysphagia, unspecified (R13.10) Plan: Continue with current plan of care       GO              [place] Joliene Salvador SLP Student Clinician  [place] Cherylynn Liszewski 05/06/2017, 12:31 PM

## 2017-05-06 NOTE — Progress Notes (Signed)
SLP Cancellation Note  Patient Details Name: Brandon Fuentes MRN: [id number] DOB: 1990/07/21   Cancelled treatment:       Reason Eval/Treat Not Completed: Patient at procedure or test/unavailable. Pt going for esophagram to assess for possible leak. Orders also received for MBS. Discussed with critical care team - MBS on hold for now as I suspect that pt's mentation is his largest barrier to diet initiation and MBS may not be clinically necessary. Will f/u to make diet recommendations pending results of esophagram.   Maxcine Hamaiewonsky, Leiyah Maultsby 05/06/2017, 10:03 AM  Maxcine HamLaura Paiewonsky, M.A. CCC-SLP [phone]

## 2017-05-06 NOTE — Progress Notes (Signed)
CSW went to speak with pt at bedside. CSW made aware still a bit agitated. CSW will speak with mother at bedside when she arrives.     Claude MangesKierra S. Jamine Highfill, MSW, LCSW-A Emergency Department Clinical Social Worker [phone]

## 2017-05-06 NOTE — Clinical Social Work Note (Signed)
Clinical Social Work Assessment  Patient Details  Name: Brandon Fuentes MRN: [id number] Date of Birth: 1990/09/09  Date of referral:  05/06/17               Reason for consult:  Other (Comment Required)                Permission sought to share information with:  Family Supports(staff member son unit. ) Permission granted to share information::  Yes, Verbal Permission Granted  Name::     Jake SharkSandra Shehadeh   Agency::  family   Relationship::  mother   Contact Information:  Jake SharkSandra Aziz [phone]  Housing/Transportation Living arrangements for the past 2 months:  Single Family Home(with mom per her reports ) Source of Information:  Parent Patient Interpreter Needed:  None Criminal Activity/Legal Involvement Pertinent to Current Situation/Hospitalization:  Yes Significant Relationships:  Parents Lives with:  Parents Do you feel safe going back to the place where you live?  Yes Need for family participation in patient care:  Yes (Comment)  Care giving concerns:  CSW spoke with pt's mother for this assessment. CSW was informed that concerns from mother with pt include where pt will be going once pt is medically cleared here at the hospital. CSW explained that CSW will not be able to determine that as pt is involved with legal issues at this time. Mom states "yes I was told that they are planning to arrest him once he is medically cleared". CSW expressed empathy to pt's mother regarding this statement at this time.    Social Worker assessment / plan:  CSW spoke with pt's mtoher for this assessment as pt has been very agitated the past few days. CSW wa sinformed that pt is form home with mother and has lived with mother since being born. Mother expressed that pt is in need of help but pt's mother isnt sure where to get pt the help that pt is needing at this time. Per mother pt has no place to go at the time of discharge as mother reports that pt is not able to come back to her home. CSW explained  that at this time CSW is limited in telling mother where pt will be going once discharged from the hospital as pt has legal concerns that will have to be addressed by someone other than a Child psychotherapistsocial worker. Mother expressed verbal understanding but is still looking to know where pt will be once discharged. During this assessment mother spoke in a quite tone. CSW was unable to assess faccial expression and appearance as this assessment was done via phone.   Employment status:  Unemployed Insurance information:  Other (Comment Required)([employer]) PT Recommendations:  Not assessed at this time Information / Referral to community resources:  [employer], [employer], Other (Comment Required)  Patient/Family's Response to care:  Mothers response to care for pt was semi understanding however pt's mother is still unsure as to why staff at the hospital pressed charges against pt. CSW explained role and validated pt's mother feelings and continues to offer support to her and other family at this time.   Patient/Family's Understanding of and Emotional Response to Diagnosis, Current Treatment, and Prognosis:  Mothers understanding of care appeared to be a mix between understanding but also wishing/hopeful that staff at the hospital were a little more understanding of pt at this time. Assessing for emotional response via phone, CSW was able to suggest that mother is stressed and dealing with a lot regarding pt  being sick and having to determine where pt is going once discharged from the hospital. Mothers response is appropiate to diagnosis and stressors that are occurring at this time.   Emotional Assessment Appearance:  Appears stated age Attitude/Demeanor/Rapport:  Unable to Assess Affect (typically observed):  Unable to Assess Orientation:  Oriented to Self Alcohol / Substance use:  Not Applicable Psych involvement (Current and /or in the community):  Yes (Comment)  Discharge Needs  Concerns to be  addressed:  Legal Concerns, Discharge Planning Concerns, Mental Health Concerns, Cognitive Concerns, Medication Concerns Readmission within the last 30 days:    Current discharge risk:  Lack of support system, Legal Concerns Barriers to Discharge:  Continued Medical Work up   [employer], LCSWA 05/06/2017, 12:29 PM

## 2017-05-06 NOTE — Progress Notes (Signed)
eLink Physician-Brief Progress Note Patient Name: Lise AuerSherwood Hefley DOB: 06-03-1990 MRN: [id number]   Date of Service  05/06/2017  HPI/Events of Note  Agitation - Request for bilateral wrist restraints.   eICU Interventions  Will order soft bilateral wrist restraints.      Intervention Category Minor Interventions: Agitation / anxiety - evaluation and management  Sommer,Steven Eugene 05/06/2017, 7:06 AM

## 2017-05-06 NOTE — Progress Notes (Signed)
PULMONARY  / CRITICAL CARE MEDICINE  Name: Brandon Fuentes MRN: [id number] DOB: [birth year]    LOS: 10  REFERRING MD : EDP [facility]  CHIEF COMPLAINT:  Agitation and combative behavior   BRIEF PATIENT DESCRIPTION: Patient is a 27 y.o male with bipolar type 1 who presented from jail per EMS for altered mental status. Approximately 3-4 days prior to presentation the patient was found to have an elevated lithium level and his lithium was subsequently discontinued. The patient then became combative and altered. On Arrival in the ED he was combative, verbally abusive, and altered requiring sedation. CXR illustrated pneumomediastinum and pneumopericardium. Patient was subsequently intubated for airway protection and [facility]. Esophogram was performed that did not reveal esophageal rupture. Over the course of his hospitalization his pneumomediastinum and pneumopericardium improved and he was extubated on 4/2.   LINES / TUBES: Right hand and arm peripheral IVs  Urinary Catheter  Extubated on 4/2 NG tube came out on 4/6  CULTURES: Blood cultures 3/29 with NGTD Urine cultures 3/29 with NGTD  ANTIBIOTICS: Vancomycin x 1 on 3/29  Zosyn 3/29 -> 04/04  INTERVAL HISTORY:  NG tube came out on 4/6.  Patient has been refusing his lithium and gabapentin.  Precedex slowly being weaned.  Patient ruminates on being on hydroxyzine, not gabapentin, and only a little lithium.   VITAL SIGNS: Temp:  [97.7 F (36.5 C)-98.9 F (37.2 C)] 98.9 F (37.2 C) (04/08 0433) Pulse Rate:  [44-81] 50 (04/07 1900) Resp:  [15-26] 19 (04/07 1900) BP: (125-159)/(77-135) 137/114 (04/07 1900) SpO2:  [93 %-100 %] 100 % (04/07 1900)   VENTILATOR SETTINGS:    INTAKE / OUTPUT: Intake/Output      04/07 0701 - 04/08 0700   I.V. (mL/kg) 2327 (17.5)   Total Intake(mL/kg) 2327 (17.5)   Urine (mL/kg/hr) 1680 (0.5)   Total Output 1680   Net +647        PHYSICAL EXAMINATION:   General: Obese gentleman resting in bed Neuro: Arousable with flight of ideas. HEENT: Normocephalic, atraumatic, moist mucus membranes Cardiovascular: RRR, no murmurs, no rubs  Lungs: Good air movement with no wheezing or crackles  Abdomen: Active bowel sounds, soft, no tenderness to palpation  Musculoskeletal: Soft restraints in place, no LE edema  Skin: Warm and dry   LABS: Cbc Recent Labs  Lab 05/02/17 0312 05/03/17 0423 05/04/17 0438  WBC 10.4 9.3 7.0  HGB 13.4 13.5 13.0  HCT 39.9 40.3 40.3  PLT 190 214 221   Chemistry Recent Labs  Lab 04/29/17 0555 04/30/17 0302  05/01/17 0452  05/04/17 0438 05/05/17 0656 05/06/17 0139  NA 148*  --    < > 148*   < > 143 141 137  K 3.0*  --    < > 3.6   < > 4.0 3.8 3.8  CL 116*  --    < > 111   < > 109 107 103  CO2 27  --    < > 25   < > 24 22 22   BUN 12  --    < > 9   < > 12 12 9   CREATININE 0.93  --    < > 0.90   < > 1.07 1.04 1.05  CALCIUM 7.7*  --    < > 9.1   < > 9.0 9.2 9.2  MG 1.9 1.8  --  1.7  --   --   --   --  PHOS 3.6 3.7  --  3.0  --   --   --   --   GLUCOSE 91  --    < > 102*   < > 103* 106* 107*   < > = values in this interval not displayed.   Liver fxn No results for input(s): AST, ALT, ALKPHOS, BILITOT, PROT, ALBUMIN in the last 168 hours.   coags No results for input(s): APTT, INR in the last 168 hours.   Sepsis markers Recent Labs  Lab 04/29/17 0555  PROCALCITON <0.10   Cardiac markers Recent Labs  Lab 04/29/17 0555 05/01/17 2016  CKTOTAL 1,647* 472*   BNP No results for input(s): PROBNP in the last 168 hours.   ABG Recent Labs  Lab 04/29/17 1347 04/30/17 1049  PHART 7.303* 7.326*  PCO2ART 59.5* 54.2*  PO2ART 71.6* 77.0*  HCO3 28.6* 28.3*  TCO2  --  30   CBG trend Recent Labs  Lab 05/01/17 1919 05/01/17 2328 05/02/17 0343 05/02/17 0726 05/02/17 1124  GLUCAP 115* 102* 100* 113* 122*   DIAGNOSES: Principal Problem:   Altered mental status Active Problems:   Acute  delirium   Pneumomediastinum (HCC)   Pneumopericardium   Elevated LFTs   Endotracheally intubated   Respiratory failure (HCC)  ASSESSMENT / PLAN: Patient is a 27 y.o male with bipolar type 1 who presented from jail per EMS for altered mental status. Subsequently found to have pneumomediastinum and pneumopericardium of uncertain etiology with multiple negative CT chest scans and esophagram. Successfully extubated on 04/02.   NEUROLOGIC A: AMS Bipolar Type 1 H/o NMS  P:   Appreciate psychiatry recommendation Wean the Precedex  Lithium 300 mg TID. Check on 04/09 Gabapentin 300 mg TID, will discuss with psychiatry about switching to hydroxyzine (may help to get more cooperation from the patient). Has Ativan 2mg  every 4 hours for agitation Soft restraints in place May need inpatient psychiatry  Has been refusing medications. Needs speech evaluation to assess whether he can eat.   PULMONARY A: Pneumomediastinum of unknown etiology  Respiratory failure   P:   Stable on CXR.  CARDIOVASCULAR A: Pneumopericardium of unknown etiology   P:  Resolved. Continue to monitor.  INFECTIOUS A: Concern for aspiration/esophageal rupture   P:   Fever curve flat and leukocytosis resolved.  7 day course of zosyn complete.  GASTROINTESTINAL A:  ?Esophageal rupture  Ileus   P:   Follow BMET, maintain K >4.0  Abdominal X-ray this AM showing normal gas pattern.   RENAL A: Acute Kidney Injury  HAGMA Hypokalemia   P:   Follow BMET  Pulmonary and Critical Care Medicine [facility] Pager: [phone]  05/06/2017, 5:40 AM

## 2017-05-06 NOTE — Progress Notes (Addendum)
Afternoon rounds  Swallow eval done and recommended thin liquid via straw under full supervision. Psych recommends continuing lithium and can add atarax 50mg  tid if needed. Pt can go to inpatient facility if needed. Can call Precious GildingLiurenant Golden at [phone] for more information about disposition requirements if needed.  Lorenso CourierVahini Babara [place], MD Internal Medicine PGY1 Pager:[phone] 05/06/2017, 4:40 PM
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
## 2017-05-07 LAB — MAGNESIUM: MAGNESIUM: 2.2 mg/dL (ref 1.7–2.4)

## 2017-05-07 LAB — RENAL FUNCTION PANEL
ANION GAP: 12 (ref 5–15)
Albumin: 3 g/dL — ABNORMAL LOW (ref 3.5–5.0)
BUN: 7 mg/dL (ref 6–20)
CO2: 21 mmol/L — ABNORMAL LOW (ref 22–32)
Calcium: 9.3 mg/dL (ref 8.9–10.3)
Chloride: 103 mmol/L (ref 101–111)
Creatinine, Ser: 1.07 mg/dL (ref 0.61–1.24)
GFR calc non Af Amer: >60 mL/min (ref >60)
GLUCOSE: 115 mg/dL — AB (ref 65–99)
PHOSPHORUS: 3.7 mg/dL (ref 2.5–4.6)
POTASSIUM: 4.5 mmol/L (ref 3.5–5.1)
Sodium: 136 mmol/L (ref 135–145)

## 2017-05-07 MED ORDER — HYDROXYZINE HCL 10 MG/5ML PO SYRP
50.0000 mg | ORAL_SOLUTION | Freq: Three times a day (TID) | ORAL | Status: DC | PRN
  Administered 2017-05-07 – 2017-05-08 (×2): 50 mg via ORAL
  Filled 2017-05-07 (×3): qty 25

## 2017-05-07 MED ORDER — ACETAMINOPHEN 160 MG/5ML PO SOLN
650.0000 mg | Freq: Four times a day (QID) | ORAL | Status: DC | PRN
  Administered 2017-05-07 – 2017-05-11 (×5): 650 mg via ORAL
  Filled 2017-05-07 (×5): qty 20.3

## 2017-05-07 MED ORDER — CLONIDINE HCL 0.1 MG PO TABS
0.1000 mg | ORAL_TABLET | Freq: Four times a day (QID) | ORAL | Status: DC
  Administered 2017-05-07 – 2017-05-09 (×6): 0.1 mg via ORAL
  Filled 2017-05-07 (×10): qty 1

## 2017-05-07 MED ORDER — LACTATED RINGERS IV SOLN
INTRAVENOUS | Status: DC
  Administered 2017-05-07 – 2017-05-08 (×3): via INTRAVENOUS

## 2017-05-07 NOTE — Progress Notes (Signed)
  Speech Language Pathology Treatment: Dysphagia  Patient Details Name: Brandon Fuentes MRN: [id number] DOB: 03/24/1990 Today's Date: 4Lise Auer/09/2017 Time: [id number] SLP Time Calculation (min) (ACUTE ONLY): 10 min  Assessment / Plan / Recommendation Clinical Impression  Pt presents with mildly improved awareness and manipulation during solid trials; however, pt still requires mod-max verbal cues to attend to task of eating and complete oral cavity clearance. Further, pt benefited from choices of POs to aid in increased oral acceptance. Pt had no signs concerning for aspiration with all trials this session. Continue to recommend full, thin liquid diet with aspiration precautions, full supervision, and medications crushed in puree. SLP will follow up with pt likely advancing to solid consistencies with improved mentation.    HPI HPI: Patient is a 27 y.o male with bipolar type 1 who presented from jail per EMS for altered mental status. Approximately 3-4 days prior to presentation the patient was found to have an elevated lithium level and his lithium was subsequently discontinued. The patient then became combative and altered. On Arrival in the ED he was combative, verbally abusive, and altered requiring sedation. CXR illustrated pneumomediastinum and pneumopericardium. Patient was subsequently intubated for airway protection and [facility]. Esophogram was performed that did not reveal esophageal rupture (not able to locate in chart). Over the course of his hospitalization his pneumomediastinum and pneumopericardium improved and he was extubated on 4/2.      SLP Plan  Continue with current plan of care       Recommendations  Diet recommendations: Thin liquid Liquids provided via: Straw;Cup Medication Administration: Crushed with puree Supervision: Full supervision/cueing for compensatory strategies;Staff to assist with self feeding Compensations: Minimize  environmental distractions;Slow rate;Small sips/bites Postural Changes and/or Swallow Maneuvers: Seated upright 90 degrees                Oral Care Recommendations: Oral care BID Follow up Recommendations: Other (comment)(tbd) SLP Visit Diagnosis: Dysphagia, unspecified (R13.10) Plan: Continue with current plan of care       GO              Brandon Fuentes SLP Student Clinician   Brandon Sahith Nurse 05/07/2017, 9:33 AM
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
## 2017-05-08 ENCOUNTER — Inpatient Hospital Stay (HOSPITAL_COMMUNITY): Payer: 59

## 2017-05-08 LAB — URINALYSIS, ROUTINE W REFLEX MICROSCOPIC
BILIRUBIN URINE: NEGATIVE
Glucose, UA: NEGATIVE mg/dL
HGB URINE DIPSTICK: NEGATIVE
KETONES UR: NEGATIVE mg/dL
Leukocytes, UA: NEGATIVE
Nitrite: NEGATIVE
PROTEIN: NEGATIVE mg/dL
Specific Gravity, Urine: 1.009 (ref 1.005–1.030)
pH: 8 (ref 5.0–8.0)

## 2017-05-08 LAB — CBC
HCT: 41.3 % (ref 39.0–52.0)
Hemoglobin: 13.9 g/dL (ref 13.0–17.0)
MCH: 26 pg (ref 26.0–34.0)
MCHC: 33.7 g/dL (ref 30.0–36.0)
MCV: 77.3 fL — AB (ref 78.0–100.0)
PLATELETS: 204 10*3/uL (ref 150–400)
RBC: 5.34 MIL/uL (ref 4.22–5.81)
RDW: 14.4 % (ref 11.5–15.5)
WBC: 21.9 10*3/uL — AB (ref 4.0–10.5)

## 2017-05-08 LAB — PROCALCITONIN: Procalcitonin: 0.26 ng/mL

## 2017-05-08 LAB — RESPIRATORY PANEL BY PCR
Adenovirus: NOT DETECTED
BORDETELLA PERTUSSIS-RVPCR: NOT DETECTED
CORONAVIRUS NL63-RVPPCR: NOT DETECTED
Chlamydophila pneumoniae: NOT DETECTED
Coronavirus 229E: NOT DETECTED
Coronavirus HKU1: NOT DETECTED
Coronavirus OC43: NOT DETECTED
Influenza A: NOT DETECTED
Influenza B: NOT DETECTED
METAPNEUMOVIRUS-RVPPCR: NOT DETECTED
Mycoplasma pneumoniae: NOT DETECTED
PARAINFLUENZA VIRUS 2-RVPPCR: NOT DETECTED
PARAINFLUENZA VIRUS 3-RVPPCR: NOT DETECTED
Parainfluenza Virus 1: NOT DETECTED
Parainfluenza Virus 4: NOT DETECTED
RHINOVIRUS / ENTEROVIRUS - RVPPCR: NOT DETECTED
Respiratory Syncytial Virus: NOT DETECTED

## 2017-05-08 LAB — LITHIUM LEVEL: Lithium Lvl: 0.36 mmol/L — ABNORMAL LOW (ref 0.60–1.20)

## 2017-05-08 LAB — LACTIC ACID, PLASMA
Lactic Acid, Venous: 1.2 mmol/L (ref 0.5–1.9)
Lactic Acid, Venous: 1.3 mmol/L (ref 0.5–1.9)
Lactic Acid, Venous: 1 mmol/L (ref 0.5–1.9)

## 2017-05-08 LAB — CK: Total CK: 131 U/L (ref 49–397)

## 2017-05-08 LAB — RENAL FUNCTION PANEL
Albumin: 2.9 g/dL — ABNORMAL LOW (ref 3.5–5.0)
Anion gap: 13 (ref 5–15)
BUN: <5 mg/dL — ABNORMAL LOW (ref 6–20)
CHLORIDE: 99 mmol/L — AB (ref 101–111)
CO2: 22 mmol/L (ref 22–32)
CREATININE: 1.06 mg/dL (ref 0.61–1.24)
Calcium: 8.9 mg/dL (ref 8.9–10.3)
GFR calc non Af Amer: >60 mL/min (ref >60)
GLUCOSE: 123 mg/dL — AB (ref 65–99)
Phosphorus: 3.8 mg/dL (ref 2.5–4.6)
Potassium: 3.7 mmol/L (ref 3.5–5.1)
Sodium: 134 mmol/L — ABNORMAL LOW (ref 135–145)

## 2017-05-08 LAB — MAGNESIUM: MAGNESIUM: 2.1 mg/dL (ref 1.7–2.4)

## 2017-05-08 MED ORDER — DEXTROSE IN LACTATED RINGERS 5 % IV SOLN
INTRAVENOUS | Status: DC
  Administered 2017-05-08: 12:00:00 via INTRAVENOUS
  Administered 2017-05-09: 100 mL/h via INTRAVENOUS

## 2017-05-08 MED ORDER — LACTATED RINGERS IV BOLUS
1000.0000 mL | Freq: Once | INTRAVENOUS | Status: AC
  Administered 2017-05-08: 1000 mL via INTRAVENOUS

## 2017-05-08 MED ORDER — POTASSIUM CHLORIDE 10 MEQ/100ML IV SOLN
10.0000 meq | INTRAVENOUS | Status: AC
  Administered 2017-05-08 (×4): 10 meq via INTRAVENOUS
  Filled 2017-05-08 (×4): qty 100

## 2017-05-08 MED ORDER — ENSURE ENLIVE PO LIQD
237.0000 mL | Freq: Two times a day (BID) | ORAL | Status: DC
  Administered 2017-05-08 – 2017-05-17 (×10): 237 mL via ORAL

## 2017-05-08 MED ORDER — HYDROXYZINE HCL 25 MG PO TABS
50.0000 mg | ORAL_TABLET | Freq: Three times a day (TID) | ORAL | Status: DC | PRN
  Administered 2017-05-08 – 2017-05-12 (×7): 50 mg via ORAL
  Filled 2017-05-08 (×2): qty 1
  Filled 2017-05-08 (×3): qty 2
  Filled 2017-05-08 (×2): qty 1

## 2017-05-08 NOTE — Progress Notes (Signed)
Pt with severe agitation and delirium throughout the night despite scheduled ativan and prn versed.  Pt refusing all PO medications this morning.  Precedex drip restarted at 0611 due to concerns for patient and staff safety.  Dr. Dimple Caseyice notified.

## 2017-05-08 NOTE — Progress Notes (Signed)
Nutrition Follow-up  DOCUMENTATION CODES:   Obesity unspecified  INTERVENTION:    Ensure Enlive PO BID, each supplement provides 350 kcal and 20 grams of protein  D/C Pro-stat 60 ml PO BID  NUTRITION DIAGNOSIS:   Inadequate oral intake related to lethargy/confusion as evidenced by meal completion < 50%.  Ongoing  GOAL:   Patient will meet greater than or equal to 90% of their needs  Unmet  MONITOR:   PO intake, Supplement acceptance, I & O's  ASSESSMENT:   Pt with PMH of Bipolar type 1 disease with recent cessation of Lithium (levels WNL on admission) admitted from jail with pneumomediastinum and pneumopericardium.   Diet has been advanced to full liquids. Patient consumed < 50% of breakfast today.   He does not like taking the Pro-stat PO, will d/c.  Labs and medications reviewed.  Diet Order:  Diet full liquid Room service appropriate? Yes; Fluid consistency: Thin  EDUCATION NEEDS:   No education needs have been identified at this time  Skin:  Skin Assessment: Reviewed RN Assessment  Last BM:  4/6  Height:   Ht Readings from Last 1 Encounters:  04/27/17 6\' 1"  (1.854 m)    Weight:   Wt Readings from Last 1 Encounters:  05/08/17 294 lb 5 oz (133.5 kg)   Admission weight 285 lb 11.5 oz (129.6 kg)  I/O + 8.5 L since admission  Ideal Body Weight:  83.6 kg  BMI:  Body mass index is 38.83 kg/m.  Estimated Nutritional Needs:   Kcal:  2400-2600  Protein:  120-140 gm  Fluid:  2.5 L    Joaquin CourtsKimberly Harris, RD, LDN, CNSC Pager [phone] After Hours Pager [phone]
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
## 2017-05-08 NOTE — Progress Notes (Signed)
Patient ate approximately 50% of his breakfast however he now refuses his lunch and ensure.Dr. Caron PresumeHelberg informed

## 2017-05-08 NOTE — Progress Notes (Signed)
MEDICATION RELATED NOTE  Pharmacy Consult for Clonidine Indication: Agitation/ICU delirium  Assessment: 20 yoM with hx of Bipolar disorder and prior NMS remains in ICU with extreme delirium and agitation. CCM has had difficulty weaning off Precedex infusion, to initiate clonidine to facilitate keeping Precedex infusion off. Note: clonidine will need to be tapered off, please see instructions below.  Plan:  -Clonidine 0.1mg  PO q6h for now -Taper Recommendation: continue 0.1mg  q6h x3-5 days then reduce to 0.1mg  q8h x3 days, then reduce to 0.1mg  q12h x3 days, then give 0.1mg  daily once   Allergies  Allergen Reactions  . Depakote [Divalproex Sodium] Other (See Comments)    Sleepiness, anger  . Geodon [Ziprasidone Hcl] Other (See Comments)    Per mom pt get very agitated  . 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 Risperidone And Related     Per mother caused Neuroleptic malignant syndrome  . Zoloft [Sertraline Hcl]     anger    Patient Measurements: Height:  (185.4 cm) Weight: 294 lb 5 oz (133.5 kg) IBW/kg (Calculated) : 79.9  Vital Signs: Temp: 99.5 F (37.5 C) (04/10 1209) Temp Source: Oral (04/10 1209) BP: 139/115 (04/10 1300) Pulse Rate: 95 (04/10 1100)  Labs: Recent Labs    05/06/17 0139 05/07/17 0420 05/08/17 0541 05/08/17 0815  WBC  --   --   --  21.9*  HGB  --   --   --  13.9  HCT  --   --   --  41.3  PLT  --   --   --  204  CREATININE 1.05 1.07 1.06  --   MG  --  2.2 2.1  --   PHOS  --  3.7 3.8  --   ALBUMIN  --  3.0* 2.9*  --    Estimated Creatinine Clearance: 154 mL/min (by C-G formula based on SCr of 1.06 mg/dL).   Medical History: Past Medical History:  Diagnosis Date  . Bipolar 1 disorder (HCC)

## 2017-05-08 NOTE — Progress Notes (Signed)
PULMONARY  / CRITICAL CARE MEDICINE  Name: Lise AuerSherwood Venditto MRN: [id number] DOB: 29-Oct-1990    LOS: 12  REFERRING MD : EDP [facility]  CHIEF COMPLAINT:  Agitation and combative behavior   BRIEF PATIENT DESCRIPTION: Patient is a 27 y.o male with bipolar type 1 who presented from jail per EMS for altered mental status. Approximately 3-4 days prior to presentation the patient was found to have an elevated lithium level and his lithium was subsequently discontinued. The patient then became combative and altered. On Arrival in the ED he was combative, verbally abusive, and altered requiring sedation. CXR illustrated pneumomediastinum and pneumopericardium. Patient was subsequently intubated for airway protection and [facility]. Esophogram was performed that did not reveal esophageal rupture. Over the course of his hospitalization his pneumomediastinum and pneumopericardium improved and he was extubated on 4/2.   LINES / TUBES: Left arm peripheral IVs  Urinary Catheter   CULTURES: Blood cultures 3/29 with NGTD Urine cultures 3/29 with NGTD  ANTIBIOTICS: Vancomycin x 1 on 3/29  Zosyn 3/29 -> 04/04  SIGNIFICANT EVENTS: Admitted with pneumomediastinum and pneumopericardium 3/29 >>> Intubated 3/29 >>> Modified esophagram illustrating no esophageal perforation 3/30 >>> Extubated 4/1 >>> Resolution of pneumomediastinum and pneumopericardium 4/3 >>> NG tube placed and started Lithium 4/2 >>>  NG tube came out 4/6 >>> Initiated liquid diet and repeat esophagram with no new findings 4/8 >>> Precedex stopped 4/9 >>> Precedex restarted 4/10 >>>  INTERVAL HISTORY: Became increasing agitated this AM. Precedex restarted.  Febrile x 2 over the interval.  Police present.  No family at bedside.   VITAL SIGNS: Temp:  [99.5 F (37.5 C)-101.2 F (38.4 C)] 100.8 F (38.2 C) (04/10 0708) Pulse Rate:  [88-136] 91 (04/10 0700) Resp:  [23-55] 30 (04/10 0700) BP:  (88-126)/(46-101) 90/76 (04/10 0700) SpO2:  [91 %-99 %] 95 % (04/10 0700)   VENTILATOR SETTINGS:    INTAKE / OUTPUT: Intake/Output      04/09 0701 - 04/10 0700 04/10 0701 - 04/11 0700   P.O. 480    I.V. (mL/kg) 2048.8 (15.7)    IV Piggyback     Total Intake(mL/kg) 2528.8 (19.4)    Urine (mL/kg/hr) 2800 (0.9)    Total Output 2800    Net -271.2          PHYSICAL EXAMINATION:  General: Obese gentleman resting in bed Neuro: Sedated but arousable. Flight of ideas. HEENT: Normocephalic, atraumatic, moist mucus membranes Cardiovascular: RRR, no murmurs, no rubs  Lungs: Good air movement with no wheezing or crackles  Abdomen: Active bowel sounds, soft, no tenderness to palpation  Musculoskeletal: Soft restraints in place, no LE edema  Skin: Warm and dry   LABS: Cbc Recent Labs  Lab 05/02/17 0312 05/03/17 0423 05/04/17 0438  WBC 10.4 9.3 7.0  HGB 13.4 13.5 13.0  HCT 39.9 40.3 40.3  PLT 190 214 221   Chemistry Recent Labs  Lab 05/06/17 0139 05/07/17 0420 05/08/17 0541  NA 137 136 134*  K 3.8 4.5 3.7  CL 103 103 99*  CO2 22 21* 22  BUN 9 7 <5*  CREATININE 1.05 1.07 1.06  CALCIUM 9.2 9.3 8.9  MG  --  2.2 2.1  PHOS  --  3.7 3.8  GLUCOSE 107* 115* 123*   Liver fxn Recent Labs  Lab 05/07/17 0420 05/08/17 0541  ALBUMIN 3.0* 2.9*     coags No results for input(s): APTT, INR in the last 168 hours.   Sepsis markers  No results for input(s): LATICACIDVEN, PROCALCITON in the last 168 hours.   Cardiac markers Recent Labs  Lab 05/01/17 2016  CKTOTAL 472*   BNP No results for input(s): PROBNP in the last 168 hours.   ABG No results for input(s): PHART, PCO2ART, PO2ART, HCO3, TCO2 in the last 168 hours. CBG trend Recent Labs  Lab 05/01/17 1919 05/01/17 2328 05/02/17 0343 05/02/17 0726 05/02/17 1124  GLUCAP 115* 102* 100* 113* 122*   DIAGNOSES: Principal Problem:   Altered mental status Active Problems:   Acute delirium   Pneumomediastinum (HCC)    Pneumopericardium   Elevated LFTs   Endotracheally intubated   Respiratory failure (HCC)   Acute encephalopathy  ASSESSMENT / PLAN: Patient is a 27 y.o male with bipolar type 1 who presented from jail per EMS for altered mental status. Subsequently found to have pneumomediastinum and pneumopericardium of uncertain etiology with multiple negative CT chest scans and esophagram. Successfully extubated on 04/02.   NEUROLOGIC A: Bipolar Type 1 H/o NMS AMS QTc 360 Lithium level pending  P:   Appreciate psychiatry recommendation Social work looking into placement for inpatient psychiatric treatment. Wean precedex  Lithium 300 mg TID. Check on 04/10 Gabapentin 300 mg TID Hydroxyzine 50 mg TID PRN Ativan 2 mg every 4 hours.   PULMONARY A: Pneumomediastinum of unknown etiology  Respiratory failure   P:   Stable on CXR from 04/05  CARDIOVASCULAR A: Pneumopericardium of unknown etiology   P:  Stable on CXR from 04/05  INFECTIOUS A: Concern for aspiration/esophageal rupture  Febrile x 2 over past 24 hours.   P:   Continue to monitor.  7 day course of zosyn complete.  GASTROINTESTINAL A:  ?Esophageal rupture  Ileus  Abdominal X-ray on 4/8 showing normal gas pattern.   P:   Continue to monitor   RENAL A: Acute Kidney Injury  HAGMA Hypokalemia  Mg 2.1 PO4 3.8 K 3.7  P:   Follow BMET  Pulmonary and Critical Care Medicine [facility] Pager: [phone]  05/08/2017, 7:24 AM

## 2017-05-09 LAB — CBC
HCT: 40.7 % (ref 39.0–52.0)
HEMOGLOBIN: 13.6 g/dL (ref 13.0–17.0)
MCH: 26 pg (ref 26.0–34.0)
MCHC: 33.4 g/dL (ref 30.0–36.0)
MCV: 77.7 fL — ABNORMAL LOW (ref 78.0–100.0)
PLATELETS: 240 10*3/uL (ref 150–400)
RBC: 5.24 MIL/uL (ref 4.22–5.81)
RDW: 15 % (ref 11.5–15.5)
WBC: 18.2 10*3/uL — ABNORMAL HIGH (ref 4.0–10.5)

## 2017-05-09 LAB — RENAL FUNCTION PANEL
ALBUMIN: 2.7 g/dL — AB (ref 3.5–5.0)
ANION GAP: 10 (ref 5–15)
BUN: <5 mg/dL — ABNORMAL LOW (ref 6–20)
CO2: 23 mmol/L (ref 22–32)
Calcium: 8.6 mg/dL — ABNORMAL LOW (ref 8.9–10.3)
Chloride: 102 mmol/L (ref 101–111)
Creatinine, Ser: 1 mg/dL (ref 0.61–1.24)
GFR calc Af Amer: >60 mL/min (ref >60)
Glucose, Bld: 127 mg/dL — ABNORMAL HIGH (ref 65–99)
POTASSIUM: 3.9 mmol/L (ref 3.5–5.1)
Phosphorus: 4.1 mg/dL (ref 2.5–4.6)
Sodium: 135 mmol/L (ref 135–145)

## 2017-05-09 LAB — MAGNESIUM: MAGNESIUM: 2.1 mg/dL (ref 1.7–2.4)

## 2017-05-09 MED ORDER — CLONIDINE HCL 0.1 MG PO TABS
0.1000 mg | ORAL_TABLET | Freq: Two times a day (BID) | ORAL | Status: DC
  Administered 2017-05-16 (×2): 0.1 mg via ORAL
  Filled 2017-05-09 (×2): qty 1

## 2017-05-09 MED ORDER — CLONIDINE HCL 0.1 MG PO TABS
0.1000 mg | ORAL_TABLET | Freq: Four times a day (QID) | ORAL | Status: AC
  Administered 2017-05-09 – 2017-05-12 (×10): 0.1 mg via ORAL
  Filled 2017-05-09 (×15): qty 1

## 2017-05-09 MED ORDER — CLONIDINE HCL 0.1 MG PO TABS
0.1000 mg | ORAL_TABLET | Freq: Three times a day (TID) | ORAL | Status: AC
  Administered 2017-05-13 – 2017-05-15 (×7): 0.1 mg via ORAL
  Filled 2017-05-09 (×7): qty 1

## 2017-05-09 MED ORDER — CLONIDINE HCL 0.1 MG PO TABS
0.1000 mg | ORAL_TABLET | Freq: Every day | ORAL | Status: DC
  Administered 2017-05-16: 0.1 mg via ORAL

## 2017-05-09 NOTE — Progress Notes (Signed)
  Speech Language Pathology Treatment: Dysphagia  Patient Details Name: Brandon Fuentes MRN: [id number] DOB: May 04, 1990 Today's Date: 05/09/2017 Time: [id number] SLP Time Calculation (min) (ACUTE ONLY): 13 min  Assessment / Plan / Recommendation Clinical Impression  Pt consumed thin liquids with Mod cues for sustained attention but no overt signs of dysphagia. Note per chart review that he has had some fevers but CXR from previous date shows no signs of infection. Initially pt was agreeable to trying advanced solid textures, but then upon offering he became paranoid ("they put poison in the food here") and refused despite encouragement from SLP and RN. His mild verbal agitation started to escalate, therefore trials were stopped. Mentation remains his biggest barrier for diet advancement - would maintain current recommendations for now.   HPI HPI: Patient is a 27 y.o male with bipolar type 1 who presented from jail per EMS for altered mental status. Approximately 3-4 days prior to presentation the patient was found to have an elevated lithium level and his lithium was subsequently discontinued. The patient then became combative and altered. On Arrival in the ED he was combative, verbally abusive, and altered requiring sedation. CXR illustrated pneumomediastinum and pneumopericardium. Patient was subsequently intubated for airway protection and [facility]. Esophogram was performed that did not reveal esophageal rupture (not able to locate in chart). Over the course of his hospitalization his pneumomediastinum and pneumopericardium improved and he was extubated on 4/2.      SLP Plan  Continue with current plan of care       Recommendations  Diet recommendations: Thin liquid Liquids provided via: Straw;Cup Medication Administration: Crushed with puree Supervision: Full supervision/cueing for compensatory strategies;Staff to assist with self  feeding Compensations: Minimize environmental distractions;Slow rate;Small sips/bites Postural Changes and/or Swallow Maneuvers: Seated upright 90 degrees                Oral Care Recommendations: Oral care BID Follow up Recommendations: (tba) SLP Visit Diagnosis: Dysphagia, unspecified (R13.10) Plan: Continue with current plan of care       GO                Maxcine Hamaiewonsky, Sydnei Ohaver 05/09/2017, 9:32 AM  Maxcine HamLaura Paiewonsky, M.A. CCC-SLP [phone]

## 2017-05-09 NOTE — Progress Notes (Addendum)
3:09pm- CSW spoke with Ron in admissions at [facility] and confirmed that pt is now on waitlist. CSW to follow up tomorrow to see about status for pt on waitlist.   2:21pm- CSW has sent over referral for [name] at this time. CSW to call and confirm that they have received it.   [facility] at [facility]. CSW has spoken with Resident and asked that a note be placed within chart that informs CSW that pt is cleared medically. CSW needs this [facility] at [facility]. CSW spoke with Dr. Sharma CovertNorman and was informed that she would see pt later today for further assessments as requested by [employer] office. CSW will send over needed [facility] once all has been received.     Claude MangesKierra S. Rodriques Badie, MSW, LCSW-A Emergency Department Clinical Social Worker [phone]

## 2017-05-09 NOTE — Progress Notes (Addendum)
PULMONARY  / CRITICAL CARE MEDICINE  Name: Brandon Fuentes MRN: [id number] DOB: 02-09-1990    LOS: 13  REFERRING MD : EDP Wonda Olds  CHIEF COMPLAINT:  Agitation and combative behavior   BRIEF PATIENT DESCRIPTION: Patient is a 27 y.o male with bipolar type 1 who presented from jail per EMS for altered mental status. Approximately 3-4 days prior to presentation the patient was found to have an elevated lithium level and his lithium was subsequently discontinued. The patient then became combative and altered. On Arrival in the ED he was combative, verbally abusive, and altered requiring sedation. CXR illustrated pneumomediastinum and pneumopericardium. Patient was subsequently intubated for airway protection and transferred to [facility] for evaluation by CT surgery. Esophogram was performed that did not reveal esophageal rupture. Over the course of his hospitalization his pneumomediastinum and pneumopericardium improved and he was extubated on 4/2.   LINES / TUBES: Left arm peripheral IVs  External urinary Catheter   CULTURES: Blood cultures 3/29 with NGTD Urine cultures 3/29 with NGTD Blood cultures 4/10 pending   ANTIBIOTICS: Vancomycin x 1 on 3/29  Zosyn 3/29 -> 04/04  SIGNIFICANT EVENTS: Admitted with pneumomediastinum and pneumopericardium 3/29 >>> Intubated 3/29 >>> Modified esophagram illustrating no esophageal perforation 3/30 >>> Extubated 4/1 >>> Resolution of pneumomediastinum and pneumopericardium 4/3 >>> NG tube placed and started Lithium 4/2 >>>  NG tube came out 4/6 >>> Initiated liquid diet and repeat esophagram with no new findings 4/8 >>> Precedex stopped 4/9 >>> Precedex restarted 4/10 >>> Precedex stopped 4/10 >>>  INTERVAL HISTORY:  Continues to be febrile with Tmax of 101.5F Off Precedex for >24 hours.  Police outside room.  No family at bedside.   VITAL SIGNS: Temp:  [98 F (36.7 C)-101.2 F (38.4 C)] 99 F (37.2 C) (04/11 0400) Pulse Rate:  [89-136] 89  (04/11 0500) Resp:  [18-50] 30 (04/11 0500) BP: (63-158)/(34-115) 121/76 (04/11 0500) SpO2:  [89 %-99 %] 98 % (04/11 0500) Weight:  [294 lb 5 oz (133.5 kg)] 294 lb 5 oz (133.5 kg) (04/10 1300)   VENTILATOR SETTINGS:    INTAKE / OUTPUT: Intake/Output      04/10 0701 - 04/11 0700   P.O. 420   I.V. (mL/kg) 1600 (12)   IV Piggyback 1500   Total Intake(mL/kg) 3520 (26.4)   Urine (mL/kg/hr) 2280 (0.7)   Total Output 2280   Net +1240       Urine Occurrence 2 x    PHYSICAL EXAMINATION:  General: Obese gentleman resting in bed Neuro: Hyperreligious. Flight of ideas. HEENT: Normocephalic, atraumatic, moist mucus membranes Cardiovascular: RRR, no murmurs, no rubs  Lungs: Good air movement with no wheezing or crackles  Abdomen: Active bowel sounds, soft, no tenderness to palpation  Musculoskeletal: Soft restraints in place, no LE edema  Skin: Warm and dry   LABS: Cbc Recent Labs  Lab 05/04/17 0438 05/08/17 0815 05/09/17 0328  WBC 7.0 21.9* 18.2*  HGB 13.0 13.9 13.6  HCT 40.3 41.3 40.7  PLT 221 204 240   Chemistry Recent Labs  Lab 05/07/17 0420 05/08/17 0541 05/09/17 0328  NA 136 134* 135  K 4.5 3.7 3.9  CL 103 99* 102  CO2 21* 22 23  BUN 7 <5* <5*  CREATININE 1.07 1.06 1.00  CALCIUM 9.3 8.9 8.6*  MG 2.2 2.1 2.1  PHOS 3.7 3.8 4.1  GLUCOSE 115* 123* 127*   Liver fxn Recent Labs  Lab 05/07/17 0420 05/08/17 0541 05/09/17 0328  ALBUMIN 3.0* 2.9* 2.7*  coags No results for input(s): APTT, INR in the last 168 hours.   Sepsis markers Recent Labs  Lab 05/08/17 1011 05/08/17 1335 05/08/17 1820  LATICACIDVEN 1.2 1.3 1.0  PROCALCITON 0.26  --   --     Cardiac markers Recent Labs  Lab 05/08/17 1335  CKTOTAL 131   BNP No results for input(s): PROBNP in the last 168 hours.   ABG No results for input(s): PHART, PCO2ART, PO2ART, HCO3, TCO2 in the last 168 hours. CBG trend Recent Labs  Lab 05/02/17 0726 05/02/17 1124  GLUCAP 113* 122*    DIAGNOSES: Principal Problem:   Altered mental status Active Problems:   Acute delirium   Pneumomediastinum (HCC)   Pneumopericardium   Elevated LFTs   Endotracheally intubated   Respiratory failure (HCC)   Acute encephalopathy  ASSESSMENT / PLAN: Patient is a 27 y.o male with bipolar type 1 who presented from jail per EMS for altered mental status. Subsequently found to have pneumomediastinum and pneumopericardium of uncertain etiology with multiple negative CT chest scans and esophagram. Successfully extubated on 04/02.   NEUROLOGIC A: Bipolar Type 1 H/o NMS AMS QTc 350 Lithium level 0.36  P:   Appreciate psychiatry recommendation Social work looking into placement for inpatient psychiatric treatment. Lithium 300 mg TID. Gabapentin 300 mg TID Hydroxyzine 50 mg TID PRN Ativan 2 mg every 4 hours.  Wean clonidine per pharmacy recommendations  PULMONARY A: Pneumomediastinum of unknown etiology  Respiratory failure   P:   Stable on CXR from 04/10  CARDIOVASCULAR A: Pneumopericardium of unknown etiology   P:  Stable on CXR from 04/05  INFECTIOUS A: Fevers Lactic acid <2.0 Procalcitonin 0.26  CK 131 UA with no pyuria or bacteruria  CXR without consolidation or infiltrate  RVP negative.  WBC trending down  P:   Continue to monitor.  Holding antibiotics as there is no clear source at this point.  GASTROINTESTINAL A:  ?Esophageal rupture  Ileus  Abdominal X-ray on 4/8 showing normal gas pattern.   P:   Continue to monitor   RENAL A: Acute Kidney Injury  HAGMA Hypokalemia  Mg 2.1 PO4 4.1 K 3.9  P:   Follow BMET  Patient medically cleared. Will change to stepdown status and transfer care.   Pulmonary and Critical Care Medicine [facility] Pager: [phone]  05/09/2017, 5:21 AM

## 2017-05-10 LAB — BASIC METABOLIC PANEL
Anion gap: 9 (ref 5–15)
BUN: 5 mg/dL — ABNORMAL LOW (ref 6–20)
CHLORIDE: 103 mmol/L (ref 101–111)
CO2: 24 mmol/L (ref 22–32)
Calcium: 8.9 mg/dL (ref 8.9–10.3)
Creatinine, Ser: 0.98 mg/dL (ref 0.61–1.24)
GFR calc Af Amer: >60 mL/min (ref >60)
GFR calc non Af Amer: >60 mL/min (ref >60)
GLUCOSE: 115 mg/dL — AB (ref 65–99)
POTASSIUM: 4.3 mmol/L (ref 3.5–5.1)
SODIUM: 136 mmol/L (ref 135–145)

## 2017-05-10 LAB — CBC
HEMATOCRIT: 39.5 % (ref 39.0–52.0)
HEMOGLOBIN: 13.1 g/dL (ref 13.0–17.0)
MCH: 26 pg (ref 26.0–34.0)
MCHC: 33.2 g/dL (ref 30.0–36.0)
MCV: 78.5 fL (ref 78.0–100.0)
Platelets: 278 10*3/uL (ref 150–400)
RBC: 5.03 MIL/uL (ref 4.22–5.81)
RDW: 15.2 % (ref 11.5–15.5)
WBC: 11.6 10*3/uL — ABNORMAL HIGH (ref 4.0–10.5)

## 2017-05-10 MED ORDER — MIDAZOLAM HCL 2 MG/2ML IJ SOLN: 1.0000 mg | INTRAMUSCULAR | Status: DC | PRN

## 2017-05-10 MED ORDER — FENTANYL CITRATE (PF) 100 MCG/2ML IJ SOLN: 100.0000 ug | Freq: Once | INTRAMUSCULAR | Status: DC

## 2017-05-10 MED ORDER — PANTOPRAZOLE SODIUM 40 MG PO PACK
40.0000 mg | PACK | Freq: Every day | ORAL | Status: DC
  Administered 2017-05-10 – 2017-05-11 (×2): 40 mg via ORAL
  Filled 2017-05-10 (×3): qty 20

## 2017-05-10 MED ORDER — LORAZEPAM 1 MG PO TABS: 1.0000 mg | ORAL_TABLET | Freq: Four times a day (QID) | ORAL | Status: DC

## 2017-05-10 MED ORDER — LORAZEPAM 1 MG PO TABS
1.0000 mg | ORAL_TABLET | Freq: Four times a day (QID) | ORAL | Status: DC
  Administered 2017-05-10 – 2017-05-14 (×6): 1 mg via ORAL
  Filled 2017-05-10 (×11): qty 1

## 2017-05-10 NOTE — Progress Notes (Addendum)
PULMONARY  / CRITICAL CARE MEDICINE  Name: Brandon Fuentes MRN: [id number] DOB: [birth year]    LOS: 14  REFERRING MD : EDP [facility]  CHIEF COMPLAINT:  Agitation and combative behavior   BRIEF PATIENT DESCRIPTION: Patient is a 27 y.o male with bipolar type 1 who presented from jail per EMS for altered mental status. Approximately 3-4 days prior to presentation the patient was found to have an elevated lithium level and his lithium was subsequently discontinued. The patient then became combative and altered. On Arrival in the ED he was combative, verbally abusive, and altered requiring sedation. CXR illustrated pneumomediastinum and pneumopericardium. Patient was subsequently intubated for airway protection and [facility]. Esophogram was performed that did not reveal esophageal rupture. Over the course of his hospitalization his pneumomediastinum and pneumopericardium improved and he was extubated on 4/2. He remained agitated after restarting oral medications requiring weaning continuous sedation through 4/10.  LINES / TUBES: LUE PIVs   CULTURES: Blood cultures 3/29 with NGTD Urine cultures 3/29 with NGTD Blood cultures 4/10 pending   ANTIBIOTICS: Vancomycin x 1 on 3/29  Zosyn 3/29 -> 04/04  SIGNIFICANT EVENTS: 3/29 Admitted with pneumomediastinum and pneumopericardium 3/29 Intubated 3/30 Modified esophagram illustrating no esophageal perforation 4/1 Extubated 4/3 Radiographic resolution of pneumomediastinum and pneumopericardium 4/2 NG tube placed and restarted Lithium 4/6 NG tube discontinued 4/8 Initiated liquid diet and repeat esophagram with no new findings 4/10 Precedex discontinued  INTERVAL HISTORY:  Remains afebrile since 4/10 Off Precedex for >24 hours.  Police outside room.  No family at bedside.   VITAL SIGNS: Temp:  [98.9 F (37.2 C)-100 F (37.8 C)] 99.7 F (37.6 C) (04/12 0352) Pulse Rate:  [83-106] 83 (04/12 0600) Resp:   [0-40] 25 (04/12 0600) BP: (118-152)/(64-95) 127/88 (04/12 0345) SpO2:  [90 %-97 %] 96 % (04/12 0600)   VENTILATOR SETTINGS:    INTAKE / OUTPUT: Intake/Output      04/11 0701 - 04/12 0700 04/12 0701 - 04/13 0700   P.O. 1040    I.V. (mL/kg) 786.5 (6)    Other 20    IV Piggyback     Total Intake(mL/kg) 1846.5 (14.1)    Urine (mL/kg/hr) 1675 (0.5)    Stool 0    Total Output 1675    Net +171.5         Urine Occurrence 1 x    Stool Occurrence 1 x     PHYSICAL EXAMINATION:  General: Obese gentleman restrained in bed Neuro: Hyperreligious. Pressured speech. Tangential thought content HEENT: Moist oral mucosa Cardiovascular: RRR, no murmurs Lungs: Good air movement with no wheezing or crackles  Abdomen: Active bowel sounds, soft, no tenderness to palpation  Musculoskeletal: Soft restraints in place, no LE edema  Skin: Warm and dry, no rashes  LABS: Cbc Recent Labs  Lab 05/04/17 0438 05/08/17 0815 05/09/17 0328  WBC 7.0 21.9* 18.2*  HGB 13.0 13.9 13.6  HCT 40.3 41.3 40.7  PLT 221 204 240   Chemistry Recent Labs  Lab 05/07/17 0420 05/08/17 0541 05/09/17 0328  NA 136 134* 135  K 4.5 3.7 3.9  CL 103 99* 102  CO2 21* 22 23  BUN 7 <5* <5*  CREATININE 1.07 1.06 1.00  CALCIUM 9.3 8.9 8.6*  MG 2.2 2.1 2.1  PHOS 3.7 3.8 4.1  GLUCOSE 115* 123* 127*   Liver fxn Recent Labs  Lab 05/07/17 0420 05/08/17 0541 05/09/17 0328  ALBUMIN 3.0* 2.9* 2.7*     coags No  results for input(s): APTT, INR in the last 168 hours.   Sepsis markers Recent Labs  Lab 05/08/17 1011 05/08/17 1335 05/08/17 1820  LATICACIDVEN 1.2 1.3 1.0  PROCALCITON 0.26  --   --     Cardiac markers Recent Labs  Lab 05/08/17 1335  CKTOTAL 131   BNP No results for input(s): PROBNP in the last 168 hours.   ABG No results for input(s): PHART, PCO2ART, PO2ART, HCO3, TCO2 in the last 168 hours. CBG trend No results for input(s): GLUCAP in the last 168 hours. DIAGNOSES: Principal Problem:    Altered mental status Active Problems:   Acute delirium   Pneumomediastinum (HCC)   Pneumopericardium   Elevated LFTs   Endotracheally intubated   Respiratory failure (HCC)   Acute encephalopathy  ASSESSMENT / PLAN: Patient is a 27 y.o male with bipolar type 1 who presented from jail per EMS for altered mental status. Subsequently found to have pneumomediastinum and pneumopericardium of uncertain etiology with multiple negative CT chest scans and esophagram. Successfully extubated on 04/02.   NEUROLOGIC A: Bipolar Type 1 H/o NMS AMS Thoughts remain disorganized QTc 350 Lithium level 0.36 on 4/10 P:   Appreciate psychiatry recommendation, reevaluation requested for appropriate placement Social work discussing [facility] Lithium 300 mg TID Gabapentin 300 mg TID Hydroxyzine 50 mg TID PRN Now on clonidine taper per pharmacy recommendations Ativan 2 mg every 4 hours, may be able to transition to PO if remains agreeable  PULMONARY A: Pneumomediastinum of unknown etiology  Respiratory failure  P:   Stable on CXR from 04/10  CARDIOVASCULAR A: Pneumopericardium of unknown etiology  P:  Stable on CXR from 04/05  INFECTIOUS A: Fever on 4/10 Tmax 101.2, WBC 18.2 LA, PCT, UA, RVP normal CXR without consolidation or infiltrate  Bld Cx NGTD P:   Follow CBC - WBC downtrending 11.6 No acute indication for abtx  GASTROINTESTINAL A: ?Esophageal rupture  Ileus  Abdominal X-ray on 4/8 showing normal gas pattern.  P:   Continue to monitor   RENAL A: Acute Kidney Injury  HAGMA Hypokalemia  P:   Follow BMET  Patient medically cleared. He is stable for transfer to stepdown level of care with ongoing psychiatric consultation.  Pulmonary and Critical Care Medicine [facility] Pager: [phone]  05/10/2017, 8:02 AM

## 2017-05-10 NOTE — Plan of Care (Signed)
  Problem: Education: Goal: Knowledge of General Education information will improve Outcome: Progressing Note:  Patient educated, but confused and delusional, will need reinforcement.    Problem: Clinical Measurements: Goal: Ability to maintain clinical measurements within normal limits will improve Outcome: Progressing   Problem: Activity: Goal: Risk for activity intolerance will decrease Outcome: Progressing Note:  Walked in room today per RN.    Problem: Nutrition: Goal: Adequate nutrition will be maintained Outcome: Progressing Note:  Advanced to regular diet, did not eat dinner tray tonight.   Problem: Elimination: Goal: Will not experience complications related to bowel motility Outcome: Progressing Note:  LBM 4/12  Goal: Will not experience complications related to urinary retention Outcome: Progressing   Problem: Pain Managment: Goal: General experience of comfort will improve Outcome: Progressing

## 2017-05-10 NOTE — Progress Notes (Signed)
  Speech Language Pathology Treatment: Dysphagia  Patient Details Name: Brandon Fuentes MRN: [id number] DOB: 03/04/1990 Today's Date: 05/10/2017 Time: [id number] SLP Time Calculation (min) (ACUTE ONLY): 21 min  Assessment / Plan / Recommendation Clinical Impression  ST follow up for possible diet advancement at request of nursing.  Nursing reported that the patient's mentation is much improved and that he may be more ready to actively participate with all PO trials.  Chart review indicated that nursing is reporting lungs that are clear but diminished.  Most recent chest xray on 05/08/2017 is showing no active disease.  Regular Barium Swallow completed on 05/06/2017 showed no overt issues but was a limited study.  Patient was agreeable to fully participate and followed all directions.  Oral mechanism exam revealed adequate lingual and labial range of motion and strength.  Given intake including thin liquids via cup and straw and regular solids the patient's oral and pharyngeal swallow appeared to be functional.  Swallow trigger appeared to be timely and hyo-laryngeal excursion was appreciated to palpation.  Mastication of dry solids appeared to be functional with no oral residue noted.  Overt s/s of aspiration were not seen.  In addition, the patient passed the [facility] Swallowing Protocol suggesting the low likelihood for aspiration.  Given this recommend advancing the patient's diet to regular with thin liquids.  ST to follow briefly given prolonged intubation for therapeutic diet tolerance but do not anticipate any issues.   HPI HPI: Patient is a 27 y.o male with bipolar type 1 who presented from jail per EMS for altered mental status. Approximately 3-4 days prior to presentation the patient was found to have an elevated lithium level and his lithium was subsequently discontinued. The patient then became combative and altered. On Arrival in the ED he was combative, verbally abusive, and altered requiring  sedation. CXR illustrated pneumomediastinum and pneumopericardium. Patient was subsequently intubated for airway protection and [facility] CT [facility]. Esophogram was performed that did not reveal esophageal rupture (not able to locate in chart). Over the course of his hospitalization his pneumomediastinum and pneumopericardium improved and he was extubated on 4/2.      SLP Plan  Continue with current plan of care       Recommendations  Diet recommendations: Regular;Thin liquid Liquids provided via: Cup;Straw Medication Administration: Whole meds with liquid Supervision: Patient able to self feed Postural Changes and/or Swallow Maneuvers: Seated upright 90 degrees                Oral Care Recommendations: Oral care BID Follow up Recommendations: Other (comment)(TBD suspect none) SLP Visit Diagnosis: Dysphagia, unspecified (R13.10) Plan: Continue with current plan of care       GO               Dimas AguasMelissa Jozi Malachi, MA, CCC-SLP Acute Rehab SLP [phone] Fleet ContrasMelissa N Shontelle Muska 05/10/2017, 10:43 AM
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
## 2017-05-10 NOTE — Progress Notes (Addendum)
CSW has [facility] other [facility] at [facility].   CSW spoke with Orson SlickHarmon from [facility] and informed CSW that pt is under medical review at this time. CSW to send any needed information to facility at this time.     Claude MangesKierra S. Nimo Verastegui, MSW, LCSW-A Emergency Department Clinical Social Worker [phone]

## 2017-05-10 NOTE — Progress Notes (Signed)
Patient irritable at times, but oriented x2 and easily redirectable.  Patient states understanding of not getting out of bed and states he will not harm self or staff.  To do trial of removing restraints.

## 2017-05-11 MED ORDER — LORAZEPAM 2 MG/ML IJ SOLN
1.0000 mg | Freq: Once | INTRAMUSCULAR | Status: AC
  Administered 2017-05-11: 1 mg via INTRAVENOUS
  Filled 2017-05-11: qty 1

## 2017-05-11 MED ORDER — LORAZEPAM 2 MG/ML IJ SOLN: 1.0000 mg | INTRAMUSCULAR | Status: DC | PRN

## 2017-05-11 MED ORDER — ACETAMINOPHEN 160 MG/5ML PO SOLN
650.0000 mg | Freq: Four times a day (QID) | ORAL | Status: DC | PRN
  Administered 2017-05-12 (×2): 650 mg via ORAL
  Filled 2017-05-11 (×2): qty 20.3

## 2017-05-11 MED ORDER — LORAZEPAM 2 MG/ML IJ SOLN
1.0000 mg | INTRAMUSCULAR | Status: DC | PRN
Start: 2017-05-11 — End: 2017-05-17

## 2017-05-11 MED ORDER — LITHIUM CITRATE 300 MG/5 ML PO SYRP
300.0000 mg | Freq: Three times a day (TID) | ORAL | Status: DC
Start: 2017-05-11 — End: 2017-05-17
  Administered 2017-05-11 – 2017-05-17 (×17): 300 mg via ORAL
  Filled 2017-05-11 (×20): qty 5

## 2017-05-11 MED ORDER — LORAZEPAM 2 MG/ML IJ SOLN
1.0000 mg | INTRAMUSCULAR | Status: DC | PRN
  Filled 2017-05-11: qty 1

## 2017-05-11 NOTE — Progress Notes (Signed)
Patient continues to refuse cardiac monitor until his "friend Drema PryShayla gets here," but there is no contact information for the friend and patient states she is "posessed by a demon." Per resident MD, leave cardiac monitor off at this time and try to replace monitor when patient is calmer.

## 2017-05-11 NOTE — Progress Notes (Signed)
Patient continues to refuse heart monitor. Patient removes monitor immediately after RN places it on patient.
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
## 2017-05-11 NOTE — Progress Notes (Signed)
[place] TEAM 1 - Stepdown/ICU TEAM  Josephine Rudnick  ZOX:[id number] DOB: 1990-05-14 DOA: 04/26/2017 PCP: Patient, No Pcp Per    Brief Narrative:  27 y.o male with Bipolar 1 D/O who presented from jail per EMS for altered mental status. Approximately 3-4 days prior to presentation the patient was found to have an elevated lithium level and his lithium was subsequently discontinued. The patient then became combative and altered. In the ED he was combative, verbally abusive, and altered requiring sedation. CXR illustrated pneumomediastinum and pneumopericardium. Patient was intubated for airway protection and transferred to [facility] for evaluation by CT surgery. Esophogram was performed that did not reveal esophageal rupture. Over the course of his hospitalization his pneumomediastinum and pneumopericardium improved and he was extubated on 4/2. He remained agitated after restarting oral medications requiring weaning continuous sedation through 4/10.  Significant Events: 3/29 Admitted with pneumomediastinum and pneumopericardium 3/29 Intubated 3/30 Modified esophagram illustrating no esophageal perforation 4/1 Extubated 4/3 Radiographic resolution of pneumomediastinum and pneumopericardium 4/2 NG tube placed and restarted Lithium 4/6 NG tube discontinued 4/8 Initiated liquid diet and repeat esophagram with no new findings 4/10 Precedex discontinued  Subjective: Pt exhibits pressured speech, but is pleasant and interactive.  He c/o some intermittent "kidney pain" which sounds more like some pleuritic type chest wall pain, but denies N/V, abdom pain, or SOB.    Assessment & Plan:  Uncontrolled / decompensated Bipolar Type 1 Lithium level 0.36 on 4/10 - SW pursuing inpt psych placement / [facility] waitlisted - Li dosing as per Psych recs - recheck Lithium level in AM and increase dose as per Psych recs if remains below therapeutic range   H/o NMS Can NOT use antipsychotic medications    Pneumomediastinum and Pneumopericardium of unknown etiology    Stable on CXR from 04/10 - spontaneously resolved   FUO on 4/10 Tmax 101.2, WBC 18.2 - LA, PCT, UA, RVP normal - CXR without consolidation or infiltrate - Bld Cx NGTD - no acute indication for abtx - WBC now approaching normal - afebrile since 4/10  Acute Kidney Injury  Resolved - crt now normal   DVT prophylaxis: SQ heparin Code Status: FULL CODE Family Communication: no family present at time of exam  Disposition Plan: transfer to med bed (pt refuses to wear tele monitor regardless) - adjust lithium prn - await Psych bed for disposition   Consultants:  PCCM Psychiatry  TCTS  Antimicrobials:  Vancomycin 3/29  Zosyn 3/29 > 04/04  Objective: Blood pressure 117/80, pulse 95, temperature 99 F (37.2 C), temperature source Oral, resp. rate (!) 21, height 6\' 1"  (1.854 m), weight 130.9 kg (288 lb 9.3 oz), SpO2 93 %.  Intake/Output Summary (Last 24 hours) at 05/11/2017 0916 Last data filed at 05/10/2017 2000 Gross per 24 hour  Intake 590 ml  Output 300 ml  Net 290 ml   Filed Weights   05/07/17 0347 05/08/17 1300 05/09/17 0500  Weight: 130.5 kg (287 lb 11.2 oz) 133.5 kg (294 lb 5 oz) 130.9 kg (288 lb 9.3 oz)    Examination: General: No acute respiratory distress Lungs: Clear to auscultation bilaterally without wheezes or crackles Cardiovascular: Regular rate and rhythm without murmur gallop or rub normal S1 and S2 Abdomen: Nontender, nondistended, soft, bowel sounds positive, no rebound, no ascites, no appreciable mass Extremities: No significant cyanosis, clubbing, or edema bilateral lower extremities  CBC: Recent Labs  Lab 05/08/17 0815 05/09/17 0328 05/10/17 0727  WBC 21.9* 18.2* 11.6*  HGB 13.9 13.6 13.1  HCT 41.3 40.7 39.5  MCV 77.3* 77.7* 78.5  PLT 204 240 278   Basic Metabolic Panel: Recent Labs  Lab 05/07/17 0420 05/08/17 0541 05/09/17 0328 05/10/17 0727  NA 136 134* 135 136  K 4.5 3.7  3.9 4.3  CL 103 99* 102 103  CO2 21* 22 23 24   GLUCOSE 115* 123* 127* 115*  BUN 7 <5* <5* 5*  CREATININE 1.07 1.06 1.00 0.98  CALCIUM 9.3 8.9 8.6* 8.9  MG 2.2 2.1 2.1  --   PHOS 3.7 3.8 4.1  --    GFR: Estimated Creatinine Clearance: 164.9 mL/min (by C-G formula based on SCr of 0.98 mg/dL).  Liver Function Tests: Recent Labs  Lab 05/07/17 0420 05/08/17 0541 05/09/17 0328  ALBUMIN 3.0* 2.9* 2.7*    Cardiac Enzymes: Recent Labs  Lab 05/08/17 1335  CKTOTAL 131    HbA1C: Hgb A1c MFr Bld  Date/Time Value Ref Range Status  04/11/2016 06:25 AM 5.8 (H) 4.8 - 5.6 % Final    Comment:    (NOTE)         Pre-diabetes: 5.7 - 6.4         Diabetes: >6.4         Glycemic control for adults with diabetes: <7.0   07/14/2009 06:40 AM (H) <5.7 % Final   6.1 (NOTE)                                                                       According to the ADA Clinical Practice Recommendations for 2011, when HbA1c is used as a screening test:   >=6.5%   Diagnostic of Diabetes Mellitus           (if abnormal result  is confirmed)  5.7-6.4%   Increased risk of developing Diabetes Mellitus  References:Diagnosis and Classification of Diabetes Mellitus,Diabetes Care,2011,34(Suppl 1):S62-S69 and Standards of Medical Care in         Diabetes - 2011,Diabetes Care,2011,34  (Suppl 1):S11-S61.     Recent Results (from the past 240 hour(s))  Respiratory Panel by PCR     Status: None   Collection Time: 05/08/17  9:20 AM  Result Value Ref Range Status   Adenovirus NOT DETECTED NOT DETECTED Final   Coronavirus 229E NOT DETECTED NOT DETECTED Final   Coronavirus HKU1 NOT DETECTED NOT DETECTED Final   Coronavirus NL63 NOT DETECTED NOT DETECTED Final   Coronavirus OC43 NOT DETECTED NOT DETECTED Final   Metapneumovirus NOT DETECTED NOT DETECTED Final   Rhinovirus / Enterovirus NOT DETECTED NOT DETECTED Final   Influenza A NOT DETECTED NOT DETECTED Final   Influenza B NOT DETECTED NOT DETECTED Final    Parainfluenza Virus 1 NOT DETECTED NOT DETECTED Final   Parainfluenza Virus 2 NOT DETECTED NOT DETECTED Final   Parainfluenza Virus 3 NOT DETECTED NOT DETECTED Final   Parainfluenza Virus 4 NOT DETECTED NOT DETECTED Final   Respiratory Syncytial Virus NOT DETECTED NOT DETECTED Final   Bordetella pertussis NOT DETECTED NOT DETECTED Final   Chlamydophila pneumoniae NOT DETECTED NOT DETECTED Final   Mycoplasma pneumoniae NOT DETECTED NOT DETECTED Final    Comment: Performed at [facility], [address] [place], [place] [address]  Blood culture (routine x 2)  Status: None (Preliminary result)   Collection Time: 05/08/17 10:21 AM  Result Value Ref Range Status   Specimen Description BLOOD RIGHT ARM  Final   Special Requests   Final    BOTTLES DRAWN AEROBIC AND ANAEROBIC Blood Culture adequate volume   Culture   Final    NO GROWTH 2 DAYS Performed at [facility], [address] [place], [place] [address]    Report Status PENDING  Incomplete  Blood culture (routine x 2)     Status: None (Preliminary result)   Collection Time: 05/08/17 10:22 AM  Result Value Ref Range Status   Specimen Description BLOOD RIGHT ARM  Final   Special Requests   Final    BOTTLES DRAWN AEROBIC AND ANAEROBIC Blood Culture adequate volume   Culture   Final    NO GROWTH 2 DAYS Performed at [facility], [address] [place], [place] [address]    Report Status PENDING  Incomplete     Scheduled Meds: . cloNIDine  0.1 mg Oral Q6H   Followed by  . [START ON 05/12/2017] cloNIDine  0.1 mg Oral Q8H   Followed by  . [START ON 05/15/2017] cloNIDine  0.1 mg Oral Q12H   Followed by  . [START ON 05/18/2017] cloNIDine  0.1 mg Oral Daily  . feeding supplement (ENSURE ENLIVE)  237 mL Oral BID BM  . gabapentin  300 mg Oral Q8H  . heparin  5,000 Units Subcutaneous Q8H  . lithium citrate  300 mg Per Tube TID  . LORazepam  1 mg Oral Q6H  . pantoprazole sodium  40 mg Oral Daily  . polyethylene  glycol  17 g Oral Daily  . sennosides  5 mL Per Tube Daily     LOS: 15 days   Lonia BloodJeffrey T. McClung, MD [facility] Office  [phone] Pager - Text Page per Amion as per below:  On-Call/Text Page:      [email or web address]      password [email or web address]  If 7PM-7AM, please contact night-coverage [email or web address] Password [email or web address] 05/11/2017, 9:16 AM

## 2017-05-12 LAB — LITHIUM LEVEL: LITHIUM LVL: 0.63 mmol/L (ref 0.60–1.20)

## 2017-05-12 MED ORDER — POLYETHYLENE GLYCOL 3350 17 G PO PACK
17.0000 g | PACK | Freq: Every day | ORAL | Status: DC | PRN
Start: 2017-05-12 — End: 2017-05-17

## 2017-05-12 MED ORDER — PANTOPRAZOLE SODIUM 40 MG PO TBEC
40.0000 mg | DELAYED_RELEASE_TABLET | Freq: Every day | ORAL | Status: DC
  Administered 2017-05-12 – 2017-05-17 (×5): 40 mg via ORAL
  Filled 2017-05-12 (×5): qty 1

## 2017-05-12 NOTE — Progress Notes (Signed)
Pt refusing all medication except tylenol, hydroxyzine and Lithium. Pt agitation slightly improved after PRN meds given. Pt continues to have full conversations with no one present in the room. Pt again educated he needs to stay in the room. Pt verbalizes understanding. Pt stated to RN "I am ready to die if that's what God wills it". Pt asked if he wants to hurt himself. Pt denies. Pt educated to call RN for further needs. Pt verbalizes understanding. [employer] at bedside.

## 2017-05-12 NOTE — Progress Notes (Signed)
Patient refused PO Ativan and other medications earlier in the evening, several times became extremely agitated, coming into hall way yelling, ect. NP Blount made aware, new orders for PRN medication received. Pt did finally agree to take PO ativan and hydroxyzine at bedtime.  Patient refusing morning dose of PO medications. Will continue to monitor.

## 2017-05-12 NOTE — Social Work (Signed)
CSW called [facility], pt still on waiting list for placement. Faxed psychiatry assessment from 4/12.   CSW continuing to follow.  Doy HutchingIsabel H Brie Fuentes, LCSWA [facility] Clinical Social Work [phone]

## 2017-05-12 NOTE — Progress Notes (Signed)
[place] TEAM 1 - Stepdown/ICU TEAM  Brandon Fuentes  YNW:[id number] DOB: 04-Mar-1990 DOA: 04/26/2017 PCP: Patient, No Pcp Per    Brief Narrative:  27 y.o male with Bipolar 1 D/O who presented from jail per EMS for altered mental status. Approximately 3-4 days prior to presentation the patient was found to have an elevated lithium level and his lithium was subsequently discontinued. The patient then became combative and altered. In the ED he was combative, verbally abusive, and altered requiring sedation. CXR illustrated pneumomediastinum and pneumopericardium. Patient was intubated for airway protection and [facility] surgery. Esophogram was performed that did not reveal esophageal rupture. Over the course of his hospitalization his pneumomediastinum and pneumopericardium improved and he was extubated on 4/2. He remained agitated after restarting oral medications requiring weaning continuous sedation through 4/10.  Significant Events: 3/29 Admitted with pneumomediastinum and pneumopericardium 3/29 Intubated 3/30 Modified esophagram illustrating no esophageal perforation 4/1 Extubated 4/3 Radiographic resolution of pneumomediastinum and pneumopericardium 4/2 NG tube placed and restarted Lithium 4/6 NG tube discontinued 4/8 Initiated liquid diet and repeat esophagram with no new findings 4/10 Precedex discontinued  Subjective: Pt exhibits pressured speech, but is pleasant and interactive.  He c/o some intermittent "kidney pain" which sounds more like some pleuritic type chest wall pain, but denies N/V, abdom pain, or SOB.    Assessment & Plan:  Uncontrolled / decompensated Bipolar Type 1 Lithium level 0.36 on 4/10  - SW pursuing inpt psych placement / [facility] waitlisted - -check lithium level today.   H/o NMS Can NOT use antipsychotic medications   Pneumomediastinum and Pneumopericardium of unknown etiology    Stable on CXR from 04/10 -  spontaneously resolved    FUO on 4/10 Tmax 101.2, WBC 18.2 - LA, PCT, UA, RVP normal - CXR without consolidation or infiltrate - Bld Cx NGTD - no acute indication for abtx - WBC now approaching normal - afebrile since 4/10 afebrile./   Acute Kidney Injury  Resolved - crt now normal   DVT prophylaxis: SQ heparin Code Status: FULL CODE Family Communication: no family present at time of exam  Disposition Plan:await Psych bed for disposition   Consultants:  PCCM Psychiatry  TCTS  Antimicrobials:  Vancomycin 3/29  Zosyn 3/29 > 04/04  Objective: Blood pressure 125/90, pulse 93, temperature 98.6 F (37 C), temperature source Oral, resp. rate 17, height 6\' 1"  (1.854 m), weight 130.9 kg (288 lb 9.3 oz), SpO2 99 %.  Intake/Output Summary (Last 24 hours) at 05/12/2017 1637 Last data filed at 05/12/2017 1030 Gross per 24 hour  Intake 1226 ml  Output -  Net 1226 ml   Filed Weights   05/07/17 0347 05/08/17 1300 05/09/17 0500  Weight: 130.5 kg (287 lb 11.2 oz) 133.5 kg (294 lb 5 oz) 130.9 kg (288 lb 9.3 oz)    Examination: General: NAD Lungs: CTA Cardiovascular: S 1, S 2 RRR Abdomen: BS present, soft, nt Extremities: no significant edema.    CBC: Recent Labs  Lab 05/08/17 0815 05/09/17 0328 05/10/17 0727  WBC 21.9* 18.2* 11.6*  HGB 13.9 13.6 13.1  HCT 41.3 40.7 39.5  MCV 77.3* 77.7* 78.5  PLT 204 240 278   Basic Metabolic Panel: Recent Labs  Lab 05/07/17 0420 05/08/17 0541 05/09/17 0328 05/10/17 0727  NA 136 134* 135 136  K 4.5 3.7 3.9 4.3  CL 103 99* 102 103  CO2 21* 22 23 24   GLUCOSE 115* 123* 127* 115*  BUN 7 <5* <5*  5*  CREATININE 1.07 1.06 1.00 0.98  CALCIUM 9.3 8.9 8.6* 8.9  MG 2.2 2.1 2.1  --   PHOS 3.7 3.8 4.1  --    GFR: Estimated Creatinine Clearance: 164.9 mL/min (by C-G formula based on SCr of 0.98 mg/dL).  Liver Function Tests: Recent Labs  Lab 05/07/17 0420 05/08/17 0541 05/09/17 0328  ALBUMIN 3.0* 2.9* 2.7*    Cardiac  Enzymes: Recent Labs  Lab 05/08/17 1335  CKTOTAL 131    HbA1C: Hgb A1c MFr Bld  Date/Time Value Ref Range Status  04/11/2016 06:25 AM 5.8 (H) 4.8 - 5.6 % Final    Comment:    (NOTE)         Pre-diabetes: 5.7 - 6.4         Diabetes: >6.4         Glycemic control for adults with diabetes: <7.0   07/14/2009 06:40 AM (H) <5.7 % Final   6.1 (NOTE)                                                                       According to the ADA Clinical Practice Recommendations for 2011, when HbA1c is used as a screening test:   >=6.5%   Diagnostic of Diabetes Mellitus           (if abnormal result  is confirmed)  5.7-6.4%   Increased risk of developing Diabetes Mellitus  References:Diagnosis and Classification of Diabetes Mellitus,Diabetes Care,2011,34(Suppl 1):S62-S69 and Standards of Medical Care in         Diabetes - 2011,Diabetes Care,2011,34  (Suppl 1):S11-S61.     Recent Results (from the past 240 hour(s))  Respiratory Panel by PCR     Status: None   Collection Time: 05/08/17  9:20 AM  Result Value Ref Range Status   Adenovirus NOT DETECTED NOT DETECTED Final   Coronavirus 229E NOT DETECTED NOT DETECTED Final   Coronavirus HKU1 NOT DETECTED NOT DETECTED Final   Coronavirus NL63 NOT DETECTED NOT DETECTED Final   Coronavirus OC43 NOT DETECTED NOT DETECTED Final   Metapneumovirus NOT DETECTED NOT DETECTED Final   Rhinovirus / Enterovirus NOT DETECTED NOT DETECTED Final   Influenza A NOT DETECTED NOT DETECTED Final   Influenza B NOT DETECTED NOT DETECTED Final   Parainfluenza Virus 1 NOT DETECTED NOT DETECTED Final   Parainfluenza Virus 2 NOT DETECTED NOT DETECTED Final   Parainfluenza Virus 3 NOT DETECTED NOT DETECTED Final   Parainfluenza Virus 4 NOT DETECTED NOT DETECTED Final   Respiratory Syncytial Virus NOT DETECTED NOT DETECTED Final   Bordetella pertussis NOT DETECTED NOT DETECTED Final   Chlamydophila pneumoniae NOT DETECTED NOT DETECTED Final   Mycoplasma pneumoniae NOT  DETECTED NOT DETECTED Final    Comment: Performed at [facility], [address] [place], [place] [address]  Blood culture (routine x 2)     Status: None (Preliminary result)   Collection Time: 05/08/17 10:21 AM  Result Value Ref Range Status   Specimen Description BLOOD RIGHT ARM  Final   Special Requests   Final    BOTTLES DRAWN AEROBIC AND ANAEROBIC Blood Culture adequate volume   Culture   Final    NO GROWTH 4 DAYS Performed at [facility],  [address] [place], [place] [address]    Report Status PENDING  Incomplete  Blood culture (routine x 2)     Status: None (Preliminary result)   Collection Time: 05/08/17 10:22 AM  Result Value Ref Range Status   Specimen Description BLOOD RIGHT ARM  Final   Special Requests   Final    BOTTLES DRAWN AEROBIC AND ANAEROBIC Blood Culture adequate volume   Culture   Final    NO GROWTH 4 DAYS Performed at [facility], [address] [place], [place] [address]    Report Status PENDING  Incomplete     Scheduled Meds: . cloNIDine  0.1 mg Oral Q6H   Followed by  . cloNIDine  0.1 mg Oral Q8H   Followed by  . [START ON 05/15/2017] cloNIDine  0.1 mg Oral Q12H   Followed by  . [START ON 05/18/2017] cloNIDine  0.1 mg Oral Daily  . feeding supplement (ENSURE ENLIVE)  237 mL Oral BID BM  . gabapentin  300 mg Oral Q8H  . heparin  5,000 Units Subcutaneous Q8H  . lithium citrate  300 mg Oral TID  . LORazepam  1 mg Oral Q6H  . pantoprazole  40 mg Oral Daily     LOS: 16 days   Hartley Barefoot, MD [facility] Office  [phone] Pager - Text Page per Loretha Stapler as per below:  On-Call/Text Page:      [email or web address]      password [email or web address]  If 7PM-7AM, please contact night-coverage [email or web address] Password [facility] 05/12/2017, 4:37 PM

## 2017-05-12 NOTE — Progress Notes (Signed)
Pt found in hallway, stating he needed something. Pt asked to return to the room by this RN. Pt re-entered the room and then stated to this RN  "I don't like your spirit". Pt educated that this RN was his nurse for the day and that we all have to work together toward his care. Pt stated he would inform his family when they arrived. Pt asked to remain in the room and to use the call bell. Pt verbalizes understanding and again stated he did not like the RN. Pt acknowledged and again re-educated on need to work together. [employer] at bedside to observe. Pt asked for towels to take a shower. Pt provided with towels for shower. Pt denied further needs. Pt up ad lib, steady gait. NAD.

## 2017-05-12 NOTE — Progress Notes (Signed)
Pt asked by this RN if he would like his 1400 medications. Medications explained to pt and pt verbalizes to RN that he will take them. Medications obtained and presented to pt. Pt lying in bed states to this RN "I don't want to hurt you. Don't make me hurt you". Pt asked by this RN what he means. Pt states "I don't want that stuff". Pt asked by this RN if he means the heparin shot. Pt verbalizes yes. Pt educated he can refuse medications. Pt continues repeating "I don't want to hurt you". Pt educated that he may refuse medications, and this RN will not force him. Pt verbalizes understanding. Pt lying in bed during conversation. Pt did allow RN to scan his bracelet, but refused all medications at this time. Pt continues with active hallucinations specifically including religious themes and "speaking with God". Pt educated to call RN for any needs. Pt verbalizes understanding and stated "I'm going to sleep now". Pt currently has sheet over his head. Chest rise and fall noted.

## 2017-05-12 NOTE — Plan of Care (Signed)
  Problem: Coping: Goal: Level of anxiety will decrease Outcome: Not Progressing  Pt continues to be anxious and have active hallucinations.

## 2017-05-13 DIAGNOSIS — F419 Anxiety disorder, unspecified: Secondary | ICD-10-CM

## 2017-05-13 DIAGNOSIS — R451 Restlessness and agitation: Secondary | ICD-10-CM

## 2017-05-13 DIAGNOSIS — R4587 Impulsiveness: Secondary | ICD-10-CM

## 2017-05-13 DIAGNOSIS — R450 Nervousness: Secondary | ICD-10-CM

## 2017-05-13 LAB — BASIC METABOLIC PANEL
ANION GAP: 11 (ref 5–15)
BUN: 5 mg/dL — ABNORMAL LOW (ref 6–20)
CO2: 24 mmol/L (ref 22–32)
Calcium: 9.5 mg/dL (ref 8.9–10.3)
Chloride: 102 mmol/L (ref 101–111)
Creatinine, Ser: 0.95 mg/dL (ref 0.61–1.24)
Glucose, Bld: 112 mg/dL — ABNORMAL HIGH (ref 65–99)
POTASSIUM: 4.2 mmol/L (ref 3.5–5.1)
SODIUM: 137 mmol/L (ref 135–145)

## 2017-05-13 LAB — CULTURE, BLOOD (ROUTINE X 2)
Culture: NO GROWTH
Culture: NO GROWTH
SPECIAL REQUESTS: ADEQUATE
SPECIAL REQUESTS: ADEQUATE

## 2017-05-13 NOTE — Progress Notes (Signed)
[place] TEAM 1 - Stepdown/ICU TEAM  Laveda NormanSherwood XXXDavis  WUJ:[id number]:[id number] DOB: [birth year] DOA: 04/26/2017 PCP: Patient, No Pcp Per    Brief Narrative:  27 y.o male with Bipolar 1 D/O who presented from jail per EMS for altered mental status. Approximately 3-4 days prior to presentation the patient was found to have an elevated lithium level and his lithium was subsequently discontinued. The patient then became combative and altered. In the ED he was combative, verbally abusive, and altered requiring sedation. CXR illustrated pneumomediastinum and pneumopericardium. Patient was intubated for airway protection and [facility] surgery. Esophogram was performed that did not reveal esophageal rupture. Over the course of his hospitalization his pneumomediastinum and pneumopericardium improved and he was extubated on 4/2. He remained agitated after restarting oral medications requiring weaning continuous sedation through 4/10.  Significant Events: 3/29 Admitted with pneumomediastinum and pneumopericardium 3/29 Intubated 3/30 Modified esophagram illustrating no esophageal perforation 4/1 Extubated 4/3 Radiographic resolution of pneumomediastinum and pneumopericardium 4/2 NG tube placed and restarted Lithium 4/6 NG tube discontinued 4/8 Initiated liquid diet and repeat esophagram with no new findings 4/10 Precedex discontinued  Subjective: He is sitting in recliner. Report improvement of kidney pain, chest pain.   Assessment & Plan:  Uncontrolled / decompensated Bipolar Type 1 Lithium level 0.36 on 4/10  - SW pursuing inpt psych placement / [facility] waitlisted - -Lithium level 0.63.  Will ask psych to follow up on patient and adjust medications. Patient at times refuse medications.   H/o NMS Can NOT use antipsychotic medications   Pneumomediastinum and Pneumopericardium of unknown etiology    Stable on CXR from 04/10 - spontaneously resolved  Chest pain  improving.   FUO on 4/10 Tmax 101.2, WBC 18.2 - LA, PCT, UA, RVP normal - CXR without consolidation or infiltrate - Bld Cx NGTD - no acute indication for abtx - WBC now approaching normal - afebrile since 4/10 afebrile./   Acute Kidney Injury  Resolved - crt now normal   DVT prophylaxis: SQ heparin Code Status: FULL CODE Family Communication: no family present at time of exam  Disposition Plan:await Psych bed for disposition   Consultants:  [facility] Psychiatry  TCTS  Antimicrobials:  Vancomycin 3/29  Zosyn 3/29 > 04/04  Objective: Blood pressure (!) 149/110, pulse (!) 101, temperature 98.3 F (36.8 C), temperature source Oral, resp. rate 18, height 6\' 1"  (1.854 m), weight 130.9 kg (288 lb 9.3 oz), SpO2 100 %.  Intake/Output Summary (Last 24 hours) at 05/13/2017 1314 Last data filed at 05/13/2017 0439 Gross per 24 hour  Intake 180 ml  Output -  Net 180 ml   Filed Weights   05/07/17 0347 05/08/17 1300 05/09/17 0500  Weight: 130.5 kg (287 lb 11.2 oz) 133.5 kg (294 lb 5 oz) 130.9 kg (288 lb 9.3 oz)    Examination: General: NAD Lungs: CTA Cardiovascular: S 1, S 2 RRR Abdomen: BS present, soft, nt Extremities; no edema.    CBC: Recent Labs  Lab 05/08/17 0815 05/09/17 0328 05/10/17 0727  WBC 21.9* 18.2* 11.6*  HGB 13.9 13.6 13.1  HCT 41.3 40.7 39.5  MCV 77.3* 77.7* 78.5  PLT 204 240 278   Basic Metabolic Panel: Recent Labs  Lab 05/07/17 0420 05/08/17 0541 05/09/17 0328 05/10/17 0727 05/13/17 0545  NA 136 134* 135 136 137  K 4.5 3.7 3.9 4.3 4.2  CL 103 99* 102 103 102  CO2 21* 22 23 24 24   GLUCOSE 115* 123* 127* 115* 112*  BUN 7 <5* <5* 5* 5*  CREATININE 1.07 1.06 1.00 0.98 0.95  CALCIUM 9.3 8.9 8.6* 8.9 9.5  MG 2.2 2.1 2.1  --   --   PHOS 3.7 3.8 4.1  --   --    GFR: Estimated Creatinine Clearance: 170.1 mL/min (by C-G formula based on SCr of 0.95 mg/dL).  Liver Function Tests: Recent Labs  Lab 05/07/17 0420 05/08/17 0541 05/09/17 0328    ALBUMIN 3.0* 2.9* 2.7*    Cardiac Enzymes: Recent Labs  Lab 05/08/17 1335  CKTOTAL 131    HbA1C: Hgb A1c MFr Bld  Date/Time Value Ref Range Status  04/11/2016 06:25 AM 5.8 (H) 4.8 - 5.6 % Final    Comment:    (NOTE)         Pre-diabetes: 5.7 - 6.4         Diabetes: >6.4         Glycemic control for adults with diabetes: <7.0   07/14/2009 06:40 AM (H) <5.7 % Final   6.1 (NOTE)                                                                       According to the ADA Clinical Practice Recommendations for 2011, when HbA1c is used as a screening test:   >=6.5%   Diagnostic of Diabetes Mellitus           (if abnormal result  is confirmed)  5.7-6.4%   Increased risk of developing Diabetes Mellitus  References:Diagnosis and Classification of Diabetes Mellitus,Diabetes Care,2011,34(Suppl 1):S62-S69 and Standards of Medical Care in         Diabetes - 2011,Diabetes Care,2011,34  (Suppl 1):S11-S61.     Recent Results (from the past 240 hour(s))  Respiratory Panel by PCR     Status: None   Collection Time: 05/08/17  9:20 AM  Result Value Ref Range Status   Adenovirus NOT DETECTED NOT DETECTED Final   Coronavirus 229E NOT DETECTED NOT DETECTED Final   Coronavirus HKU1 NOT DETECTED NOT DETECTED Final   Coronavirus NL63 NOT DETECTED NOT DETECTED Final   Coronavirus OC43 NOT DETECTED NOT DETECTED Final   Metapneumovirus NOT DETECTED NOT DETECTED Final   Rhinovirus / Enterovirus NOT DETECTED NOT DETECTED Final   Influenza A NOT DETECTED NOT DETECTED Final   Influenza B NOT DETECTED NOT DETECTED Final   Parainfluenza Virus 1 NOT DETECTED NOT DETECTED Final   Parainfluenza Virus 2 NOT DETECTED NOT DETECTED Final   Parainfluenza Virus 3 NOT DETECTED NOT DETECTED Final   Parainfluenza Virus 4 NOT DETECTED NOT DETECTED Final   Respiratory Syncytial Virus NOT DETECTED NOT DETECTED Final   Bordetella pertussis NOT DETECTED NOT DETECTED Final   Chlamydophila pneumoniae NOT DETECTED NOT  DETECTED Final   Mycoplasma pneumoniae NOT DETECTED NOT DETECTED Final    Comment: Performed at [facility], [address] [place], [place] [address]  Blood culture (routine x 2)     Status: None (Preliminary result)   Collection Time: 05/08/17 10:21 AM  Result Value Ref Range Status   Specimen Description BLOOD RIGHT ARM  Final   Special Requests   Final    BOTTLES DRAWN AEROBIC AND ANAEROBIC Blood Culture adequate volume   Culture  Final    NO GROWTH 4 DAYS Performed at [facility], [address] [place], [place] [address]    Report Status PENDING  Incomplete  Blood culture (routine x 2)     Status: None (Preliminary result)   Collection Time: 05/08/17 10:22 AM  Result Value Ref Range Status   Specimen Description BLOOD RIGHT ARM  Final   Special Requests   Final    BOTTLES DRAWN AEROBIC AND ANAEROBIC Blood Culture adequate volume   Culture   Final    NO GROWTH 4 DAYS Performed at [facility], [address], [place], [place] [address]    Report Status PENDING  Incomplete     Scheduled Meds: . cloNIDine  0.1 mg Oral Q8H   Followed by  . [START ON 05/15/2017] cloNIDine  0.1 mg Oral Q12H   Followed by  . [START ON 05/18/2017] cloNIDine  0.1 mg Oral Daily  . feeding supplement (ENSURE ENLIVE)  237 mL Oral BID BM  . gabapentin  300 mg Oral Q8H  . heparin  5,000 Units Subcutaneous Q8H  . lithium citrate  300 mg Oral TID  . LORazepam  1 mg Oral Q6H  . pantoprazole  40 mg Oral Daily     LOS: 17 days   Hartley Barefoot, MD [facility] Office  [phone] Pager - Text Page per Loretha Stapler as per below:  On-Call/Text Page:      [email or web address]      password [email or web address]  If 7PM-7AM, please contact night-coverage [email or web address] Password [email or web address] 05/13/2017, 1:14 PM

## 2017-05-13 NOTE — Consult Note (Signed)
[facility] Psychiatry Consult   Reason for Consult:  Agitation  Referring Physician:  EDP Patient Identification: Brandon Fuentes MRN:  [id number] Principal Diagnosis: Acute encephalopathy Diagnosis:   Patient Active Problem List   Diagnosis Date Noted  . Acute encephalopathy [G93.40]   . Altered mental status [R41.82]   . Respiratory failure ([place]) [J96.90]   . Pneumomediastinum ([place]) [J98.2]   . Pneumopericardium [I31.9]   . Elevated LFTs [R94.5]   . Endotracheally intubated [Z97.8]   . Acute delirium [R41.0] 04/26/2017  . Bipolar disorder, curr episode mixed, severe, w/o psychotic features ([place]) [F31.63] 04/10/2016  . Bipolar I disorder, current or most recent episode manic, severe ([place]) [F31.13] 04/01/2016  . 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 Psychosis ([place]) [F29] 04/01/2016    Total Time spent with patient: 45 minutes  Subjective:   Brandon Fuentes is a 27 y.o. male patient admitted with agitation, bipolar management.  HPI:  27 yo male who presented to the ED via jail for lithium toxicity.  On assessment today he is irritable and believes he is going home, not the case.  He was released earlier today from police monitoring and restraints.  Brandon Fuentes is willing to take Lithium and hydroxyzine for his bipolar disorder but nothing else.  History of NMS.  Continue to seek [facility] placement for stabilization, too acute for other psychiatric facilities.  Past Psychiatric History: bipolar disorder  Risk to Self: Is patient at risk for suicide?: No, but patient needs Medical Clearance Risk to Others:  agitated Prior Inpatient Therapy:  [facility] Prior Outpatient Therapy:  [facility]  Past Medical History:  Past Medical History:  Diagnosis Date  . Bipolar 1 disorder ([place])    History reviewed. No pertinent surgical history. Family History:  Family History  Problem Relation Age of Onset  . Diabetes Mother    Family Psychiatric  History: none Social History:  Social  History   Substance and Sexual Activity  Alcohol Use No     Social History   Substance and Sexual Activity  Drug Use No    Social History   Socioeconomic History  . Marital status: Single    Spouse name: Not on file  . Number of children: Not on file  . Years of education: Not on file  . Highest education level: Not on file  Occupational History  . Not on file  Social Needs  . Financial resource strain: Patient refused  . Food insecurity:    Worry: Patient refused    Inability: Patient refused  . Transportation needs:    Medical: Patient refused    Non-medical: Patient refused  Tobacco Use  . Smoking status: Never Smoker  . Smokeless tobacco: Never Used  Substance and Sexual Activity  . Alcohol use: No  . Drug use: No  . Sexual activity: Yes    Birth control/protection: None  Lifestyle  . Physical activity:    Days per week: Not on file    Minutes per session: Not on file  . Stress: Not on file  Relationships  . Social connections:    Talks on phone: Not on file    Gets together: Not on file    Attends religious service: Not on file    Active member of club or organization: Not on file    Attends meetings of clubs or organizations: Not on file    Relationship status: Not on file  Other Topics Concern  . Not on file  Social History Narrative  . Not on  file   Additional Social History:    Allergies:   Allergies  Allergen Reactions  . Depakote [Divalproex Sodium] Other (See Comments)    Sleepiness, anger  . Geodon [Ziprasidone Hcl] Other (See Comments)    Per mom pt get very agitated  . 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 Risperidone And Related     Per mother caused Neuroleptic malignant syndrome  . Zoloft [Sertraline Hcl]     anger    Labs:  Results for orders placed or performed during the hospital encounter of 04/26/17 (from the past 48 hour(s))  Lithium level     Status: None   Collection Time:  05/12/17 11:25 AM  Result Value Ref Range   Lithium Lvl 0.63 0.60 - 1.20 mmol/L    Comment: Performed at [facility] [address] [place], [place] [address]  Basic metabolic panel     Status: Abnormal   Collection Time: 05/13/17  5:45 AM  Result Value Ref Range   Sodium 137 135 - 145 mmol/L   Potassium 4.2 3.5 - 5.1 mmol/L   Chloride 102 101 - 111 mmol/L   CO2 24 22 - 32 mmol/L   Glucose, Bld 112 (H) 65 - 99 mg/dL   BUN 5 (L) 6 - 20 mg/dL   Creatinine, Ser 0.95 0.61 - 1.24 mg/dL   Calcium 9.5 8.9 - 10.3 mg/dL   GFR calc non Af Amer >60 >60 mL/min   GFR calc Af Amer >60 >60 mL/min    Comment: (NOTE) The eGFR has been calculated using the CKD EPI equation. This calculation has not been validated in all clinical situations. eGFR's persistently <60 mL/min signify possible Chronic Kidney Disease.    Anion gap 11 5 - 15    Comment: Performed at [facility] [address] [place], [place] [address]    Current Facility-Administered Medications  Medication Dose Route Frequency Provider Last Rate Last Dose  . acetaminophen (TYLENOL) solution 650 mg  650 mg Oral Q6H PRN Cherene Altes, MD   650 mg at 05/12/17 1607  . cloNIDine (CATAPRES) tablet 0.1 mg  0.1 mg Oral Q8H Helberg, Justin, MD   0.1 mg at 05/13/17 1500   Followed by  . [START ON 05/15/2017] cloNIDine (CATAPRES) tablet 0.1 mg  0.1 mg Oral Q12H Ina Homes, MD       Followed by  . [START ON 05/18/2017] cloNIDine (CATAPRES) tablet 0.1 mg  0.1 mg Oral Daily Helberg, Justin, MD      . feeding supplement (ENSURE ENLIVE) (ENSURE ENLIVE) liquid 237 mL  237 mL Oral BID BM Brand Males, MD   237 mL at 05/13/17 1500  . gabapentin (NEURONTIN) 250 MG/5ML solution 300 mg  300 mg Oral Q8H Mannam, Praveen, MD   300 mg at 05/11/17 1250  . heparin injection 5,000 Units  5,000 Units Subcutaneous Q8H Omar Person, NP   5,000 Units at 05/13/17 1500  . hydrOXYzine (ATARAX/VISTARIL) tablet 50 mg  50 mg Oral TID  PRN Brand Males, MD   50 mg at 05/12/17 2204  . lithium citrate 300 MG/5ML solution 300 mg  300 mg Oral TID Cherene Altes, MD   300 mg at 05/13/17 1500  . LORazepam (ATIVAN) injection 1-2 mg  1-2 mg Intramuscular Q4H PRN Blount, Scarlette Shorts T, NP      . LORazepam (ATIVAN) tablet 1 mg  1 mg Oral Q6H Rice, Resa Miner, MD  1 mg at 05/11/17 2256  . ondansetron (ZOFRAN) injection 4 mg  4 mg Intravenous Q6H PRN Collier Salina, MD   4 mg at 05/04/17 1024  . pantoprazole (PROTONIX) EC tablet 40 mg  40 mg Oral Daily Regalado, Belkys A, MD   40 mg at 05/13/17 1223  . polyethylene glycol (MIRALAX / GLYCOLAX) packet 17 g  17 g Oral Daily PRN Regalado, Belkys A, MD        Musculoskeletal: Strength & Muscle Tone: within normal limits Gait & Station: normal Patient leans: N/A  Psychiatric Specialty Exam: Physical Exam  Constitutional: He is oriented to person, place, and time. He appears well-developed and well-nourished.  HENT:  Head: Normocephalic.  Neck: Normal range of motion.  Respiratory: Effort normal.  Musculoskeletal: Normal range of motion.  Neurological: He is alert and oriented to person, place, and time.  Psychiatric: His speech is normal. Thought content normal. His mood appears anxious. His affect is labile. He is agitated. Cognition and memory are normal. He expresses impulsivity.    Review of Systems  Psychiatric/Behavioral: The patient is nervous/anxious.   All other systems reviewed and are negative.   Blood pressure 121/90, pulse (!) 112, temperature 98.6 F (37 C), temperature source Oral, resp. rate 16, height '6\' 1"'  (1.854 m), weight 130.9 kg (288 lb 9.3 oz), SpO2 100 %.Body mass index is 38.07 kg/m.  General Appearance: Casual  Eye Contact:  Good  Speech:  Normal Rate  Volume:  Increased  Mood:  Irritable  Affect:  Blunt  Thought Process:  Coherent and Descriptions of Associations: Intact  Orientation:  Full (Time, Place, and Person)  Thought Content:   WDL and Logical  Suicidal Thoughts:  No  Homicidal Thoughts:  No  Memory:  Immediate;   Fair Recent;   Fair Remote;   Fair  Judgement:  Impaired  Insight:  Lacking  Psychomotor Activity:  Increased  Concentration:  Concentration: Fair and Attention Span: Fair  Recall:  [employer] of Knowledge:  Fair  Language:  Fair  Akathisia:  No  Handed:  Right  AIMS (if indicated):     Assets:  Leisure Time Physical Health Resilience Social Support  ADL's:  Intact  Cognition:  WNL  Sleep:        Treatment Plan Summary: Daily contact with patient to assess and evaluate symptoms and progress in treatment, Medication management and Plan bipolar affective disorder, mania, moderate:   -Continue Lithium 300 mg TID for mood stabilization. Continue to monitor lithium levels -Continue Ativan 1 mg 6 hours for agitation oral or 1-2 mg IV every four Hours PRN anxiety -Patient has a history of NMS so antipsychotic medications should be avoided.  -ContinueGabapentin 300 mg TID for agitation. -Can give hydroxyzine 50 mg TID PRN for anxiety/agitation (reportedly requested by patient).  -Patient will require inpatient psychiatric hospitalization for stabilization once he is medically cleared given continued disorganized behaviors and agitation unless his disposition is determined to be with law enforcementand psychiatric care would then be provided in jail. -EKG reviewed and QTc 451 on 4/3. -Psychiatry will continue to follow as needed.  Disposition: Recommend psychiatric Inpatient admission when medically cleared.  Waylan Boga, NP 05/13/2017 5:30 PM

## 2017-05-13 NOTE — Care Management (Signed)
[phone].  Awaiting call back. Ronny FlurryHeather Jahson Emanuele RN BSN [phone]

## 2017-05-13 NOTE — Social Work (Addendum)
CSW Montel ClockKierra had faxed pt clinical [facility]. [name] has returned call to this writer and states that pt does not meet criteria for their priority list.  6:00pm- Aware that pt currently has telesitter, [employer] has discontinued monitoring of pt , is still warranting inpatient psychiatric treatment and is too acute for other [facility] facilities, [facility].   Doy HutchingIsabel H Cervando Durnin, LCSWA [facility] Clinical Social Work [phone]

## 2017-05-13 NOTE — Progress Notes (Signed)
[phone] William in admission. Chrissie NoaWilliam sought further information on whether pt was still needing placement or not. CS[facility] to inform her that yes pt is still in need of placement. Sonya expressed that she would update Chrissie NoaWilliam of this. CSW still following for further needs.     Claude MangesKierra S. Kullen Tomasetti, MSW, LCSW-A Emergency Department Clinical Social Worker [phone]

## 2017-05-13 NOTE — Progress Notes (Signed)
CSW [facility] at [facility].     Claude MangesKierra S. Emanuel Dowson, MSW, LCSW-A Emergency Department Clinical Social Worker [phone]

## 2017-05-14 MED ORDER — HYDROXYZINE HCL 50 MG/ML IM SOLN
50.0000 mg | Freq: Once | INTRAMUSCULAR | Status: AC
  Administered 2017-05-14: 50 mg via INTRAMUSCULAR
  Filled 2017-05-14: qty 1

## 2017-05-14 MED ORDER — GABAPENTIN 100 MG PO CAPS
100.0000 mg | ORAL_CAPSULE | Freq: Three times a day (TID) | ORAL | Status: DC
  Administered 2017-05-16 – 2017-05-17 (×3): 100 mg via ORAL
  Filled 2017-05-14 (×7): qty 1

## 2017-05-14 MED ORDER — HYDROXYZINE HCL 25 MG PO TABS
25.0000 mg | ORAL_TABLET | Freq: Three times a day (TID) | ORAL | Status: DC | PRN
  Administered 2017-05-14 – 2017-05-15 (×4): 25 mg via ORAL
  Filled 2017-05-14 (×5): qty 1

## 2017-05-14 NOTE — Progress Notes (Addendum)
CSW received phone call about pt, concerning pt's IVC. CSW informed caller a TTS consult/ pt's attending is needed to make a decision concerning rescinding pt's IVC to be taken into police custody after incident earlier today.   Montine CircleKelsy Shatonya Passon, Silverio LayLCSWA [facility]  [phone]

## 2017-05-14 NOTE — Progress Notes (Signed)
[place] TEAM 1 - Stepdown/ICU TEAM  [name]  ZOX:[id number] DOB: 05-04-1990 DOA: 04/26/2017 PCP: Patient, No Pcp Per    Brief Narrative:  27 y.o male with Bipolar 1 D/O who presented from jail per EMS for altered mental status. Approximately 3-4 days prior to presentation the patient was found to have an elevated lithium level and his lithium was subsequently discontinued. The patient then became combative and altered. In the ED he was combative, verbally abusive, and altered requiring sedation. CXR illustrated pneumomediastinum and pneumopericardium. Patient was intubated for airway protection and transferred to [facility] for evaluation by CT surgery. Esophogram was performed that did not reveal esophageal rupture. Over the course of his hospitalization his pneumomediastinum and pneumopericardium improved and he was extubated on 4/2. He remained agitated after restarting oral medications requiring weaning continuous sedation through 4/10. He is on oral lithium, psych management medications. Gabapentin decreased to 100 mg TID 4-16.  Significant Events: 3/29 Admitted with pneumomediastinum and pneumopericardium 3/29 Intubated 3/30 Modified esophagram illustrating no esophageal perforation 4/1 Extubated 4/3 Radiographic resolution of pneumomediastinum and pneumopericardium 4/2 NG tube placed and restarted Lithium 4/6 NG tube discontinued 4/8 Initiated liquid diet and repeat esophagram with no new findings 4/10 Precedex discontinued  Subjective: He is sitting in bed. Calm,. Not aggressive.  His mother is visiting. She had some concern with gabapentin. She was able to speak with psychiatrist and now she is ok with gabapentin doses.  Per mother patient gets aggressive with ativan.   He continue to complain of kidney pain. Some pleuritic chest pain.   Assessment & Plan:  Uncontrolled / decompensated Bipolar Type 1 Lithium level 0.36 on 4/10  - SW pursuing inpt psych placement / [facility] - -Lithium level 0.63.  -Discussed with Psych Dr  Lucianne Muss. ; continue with current lithium dose 300 mg TID. Change gabapentin to 100 mg TID. Discontinue ativan schedule. Atarax change to 25 mg TID PRN.    H/o Neuroleptic Malignant Syndrome Can NOT use antipsychotic medications   Pneumomediastinum and Pneumopericardium of unknown etiology    Stable on CXR from 04/10 - spontaneously resolved  Chest pain improving.  Will repeat chest x ray 4-17.   FUO on 4/10 Tmax 101.2, WBC 18.2 - LA, PCT, UA, RVP normal - CXR without consolidation or infiltrate - Bld Cx NGTD - no acute indication for abtx - WBC now approaching normal - afebrile since 4/10 afebrile./   Acute Kidney Injury  Resolved - crt now normal   Back pain; kidney pain; check KUB. ua on 4-10 normal.    DVT prophylaxis: SQ heparin Code Status: FULL CODE Family Communication: mother at bedside 4-16 Disposition Plan:await Psych facility bed.   Consultants:  PCCM Psychiatry  TCTS  Antimicrobials:  Vancomycin 3/29  Zosyn 3/29 > 04/04  Objective: Blood pressure 127/72, pulse 64, temperature 98.4 F (36.9 C), temperature source Oral, resp. rate 18, height 6\' 1"  (1.854 m), weight 130.9 kg (288 lb 9.3 oz), SpO2 98 %. No intake or output data in the 24 hours ending 05/14/17 1119 Filed Weights   05/07/17 0347 05/08/17 1300 05/09/17 0500  Weight: 130.5 kg (287 lb 11.2 oz) 133.5 kg (294 lb 5 oz) 130.9 kg (288 lb 9.3 oz)    Examination: General: NAD Lungs: CTA Cardiovascular: S 1, S 2 RRR Abdomen: BS present, soft, nt Extremities; no edema.    CBC: Recent Labs  Lab 05/08/17 0815 05/09/17 0328 05/10/17 0727  WBC 21.9* 18.2* 11.6*  HGB 13.9 13.6 13.1  HCT 41.3 40.7 39.5  MCV 77.3* 77.7* 78.5  PLT 204 240 278   Basic Metabolic Panel: Recent Labs  Lab 05/08/17 0541 05/09/17 0328 05/10/17 0727 05/13/17 0545  NA 134* 135 136 137  K 3.7 3.9 4.3 4.2  CL 99* 102 103 102  CO2 22 23 24 24     GLUCOSE 123* 127* 115* 112*  BUN <5* <5* 5* 5*  CREATININE 1.06 1.00 0.98 0.95  CALCIUM 8.9 8.6* 8.9 9.5  MG 2.1 2.1  --   --   PHOS 3.8 4.1  --   --    GFR: Estimated Creatinine Clearance: 170.1 mL/min (by C-G formula based on SCr of 0.95 mg/dL).  Liver Function Tests: Recent Labs  Lab 05/08/17 0541 05/09/17 0328  ALBUMIN 2.9* 2.7*    Cardiac Enzymes: Recent Labs  Lab 05/08/17 1335  CKTOTAL 131    HbA1C: Hgb A1c MFr Bld  Date/Time Value Ref Range Status  04/11/2016 06:25 AM 5.8 (H) 4.8 - 5.6 % Final    Comment:    (NOTE)         Pre-diabetes: 5.7 - 6.4         Diabetes: >6.4         Glycemic control for adults with diabetes: <7.0   07/14/2009 06:40 AM (H) <5.7 % Final   6.1 (NOTE)                                                                       According to the ADA Clinical Practice Recommendations for 2011, when HbA1c is used as a screening test:   >=6.5%   Diagnostic of Diabetes Mellitus           (if abnormal result  is confirmed)  5.7-6.4%   Increased risk of developing Diabetes Mellitus  References:Diagnosis and Classification of Diabetes Mellitus,Diabetes Care,2011,34(Suppl 1):S62-S69 and Standards of Medical Care in         Diabetes - 2011,Diabetes Care,2011,34  (Suppl 1):S11-S61.     Recent Results (from the past 240 hour(s))  Respiratory Panel by PCR     Status: None   Collection Time: 05/08/17  9:20 AM  Result Value Ref Range Status   Adenovirus NOT DETECTED NOT DETECTED Final   Coronavirus 229E NOT DETECTED NOT DETECTED Final   Coronavirus HKU1 NOT DETECTED NOT DETECTED Final   Coronavirus NL63 NOT DETECTED NOT DETECTED Final   Coronavirus OC43 NOT DETECTED NOT DETECTED Final   Metapneumovirus NOT DETECTED NOT DETECTED Final   Rhinovirus / Enterovirus NOT DETECTED NOT DETECTED Final   Influenza A NOT DETECTED NOT DETECTED Final   Influenza B NOT DETECTED NOT DETECTED Final   Parainfluenza Virus 1 NOT DETECTED NOT DETECTED Final    Parainfluenza Virus 2 NOT DETECTED NOT DETECTED Final   Parainfluenza Virus 3 NOT DETECTED NOT DETECTED Final   Parainfluenza Virus 4 NOT DETECTED NOT DETECTED Final   Respiratory Syncytial Virus NOT DETECTED NOT DETECTED Final   Bordetella pertussis NOT DETECTED NOT DETECTED Final   Chlamydophila pneumoniae NOT DETECTED NOT DETECTED Final   Mycoplasma pneumoniae NOT DETECTED NOT DETECTED Final    Comment: Performed at [facility], [address] [place], [place] [address]  Blood culture (routine x 2)  Status: None   Collection Time: 05/08/17 10:21 AM  Result Value Ref Range Status   Specimen Description BLOOD RIGHT ARM  Final   Special Requests   Final    BOTTLES DRAWN AEROBIC AND ANAEROBIC Blood Culture adequate volume   Culture   Final    NO GROWTH 5 DAYS Performed at [facility], [address], [place], [place] [address]    Report Status 05/13/2017 FINAL  Final  Blood culture (routine x 2)     Status: None   Collection Time: 05/08/17 10:22 AM  Result Value Ref Range Status   Specimen Description BLOOD RIGHT ARM  Final   Special Requests   Final    BOTTLES DRAWN AEROBIC AND ANAEROBIC Blood Culture adequate volume   Culture   Final    NO GROWTH 5 DAYS Performed at [facility], [address], [place], [place] [address]    Report Status 05/13/2017 FINAL  Final     Scheduled Meds: . cloNIDine  0.1 mg Oral Q8H   Followed by  . [START ON 05/15/2017] cloNIDine  0.1 mg Oral Q12H   Followed by  . [START ON 05/18/2017] cloNIDine  0.1 mg Oral Daily  . feeding supplement (ENSURE ENLIVE)  237 mL Oral BID BM  . heparin  5,000 Units Subcutaneous Q8H  . lithium citrate  300 mg Oral TID  . LORazepam  1 mg Oral Q6H  . pantoprazole  40 mg Oral Daily     LOS: 18 days   Hartley Barefoot, MD [facility] Office  [phone] Pager - Text Page per Loretha Stapler as per below:  On-Call/Text Page:      [email or web address]      password [email or web address]  If 7PM-7AM, please contact  night-coverage [email or web address] Password [email or web address] 05/14/2017, 11:19 AM

## 2017-05-14 NOTE — Progress Notes (Addendum)
1700 Pt is alone in the room,  Agitated, throwing things from his room to the hallway. Security personnel called in. Pt is in bed when they came, they were able to talk to the pt, pt is calmer now. Dr Sunnie Nielsenegalado notified. Dr Lucianne MussKumar notified, ordered Hydroxyzine IM for agitation. Security officers will be in for safety from 1800 - 0600.

## 2017-05-14 NOTE — Progress Notes (Signed)
Nutrition Follow-up  DOCUMENTATION CODES:   Obesity unspecified  INTERVENTION:   -Continue Ensure Enlive po BID, each supplement provides 350 kcal and 20 grams of protein  NUTRITION DIAGNOSIS:   Inadequate oral intake related to lethargy/confusion as evidenced by meal completion < 50%.  Ongoing  GOAL:   Patient will meet greater than or equal to 90% of their needs  Progressing  MONITOR:   PO intake, Supplement acceptance, I & O's  REASON FOR ASSESSMENT:   Ventilator    ASSESSMENT:   Pt with PMH of Bipolar type 1 disease with recent cessation of Lithium (levels WNL on admission) admitted from jail with pneumomediastinum and pneumopericardium.   4/12- advanced to a regular diet with thin liquids  Reviewed I/O's: +2.2 L since 04/30/17  Pt receiving nursing care at time of visit. He continues to be agitated at times. SLP services signed off this morning. Intake remains variable; PO: 10-100%. Pt is taking Ensure supplements intermittently, but has accepted the last 3 doses. Will continue with supplements due to variable intake.   Per CSW notes, pt continues to [facility].   Labs reviewed.   Diet Order:  Diet regular Room service appropriate? Yes; Fluid consistency: Thin  EDUCATION NEEDS:   No education needs have been identified at this time  Skin:  Skin Assessment: Reviewed RN Assessment  Last BM:  05/10/17  Height:   Ht Readings from Last 1 Encounters:  04/27/17 6\' 1"  (1.854 m)    Weight:   Wt Readings from Last 1 Encounters:  05/09/17 288 lb 9.3 oz (130.9 kg)    Ideal Body Weight:  83.6 kg  BMI:  Body mass index is 38.07 kg/m.  Estimated Nutritional Needs:   Kcal:  2400-2600  Protein:  120-140 gm  Fluid:  2.5 L    Meriah Shands A. Mayford KnifeWilliams, RD, LDN, CDE Pager: [phone] After hours Pager: [phone]

## 2017-05-14 NOTE — Progress Notes (Signed)
  Speech Language Pathology Treatment: Dysphagia  Patient Details Name: Brandon Fuentes MRN: [id number] DOB: 1990-04-24 Today's Date: 05/14/2017 Time: 8592-9244 SLP Time Calculation (min) (ACUTE ONLY): 10 min  Assessment / Plan / Recommendation Clinical Impression  Pt with no dysphagia - eating independently.  Normal oral function, mastication.  Brisk swallow response.  No s/s of aspiration after consumption of large, successive boluses of water.  Phonation normal s/p intubation. Lungs clear.  Continue regular diet, thin liquids.  SLP services to sign off.   HPI HPI: Patient is a 27 y.o male with bipolar type 1 who presented from jail per EMS for altered mental status. Approximately 3-4 days prior to presentation the patient was found to have an elevated lithium level and his lithium was subsequently discontinued. The patient then became combative and altered. On Arrival in the ED he was combative, verbally abusive, and altered requiring sedation. CXR illustrated pneumomediastinum and pneumopericardium. Patient was subsequently intubated for airway protection and transferred to [facility] for evaluation by CT surgery. Esophogram was performed that did not reveal esophageal rupture (not able to locate in chart). Over the course of his hospitalization his pneumomediastinum and pneumopericardium improved and he was extubated on 4/2.      SLP Plan  All goals met       Recommendations  Diet recommendations: Regular;Thin liquid Liquids provided via: Cup;Straw                Plan: All goals met       GO                Brandon Fuentes 05/14/2017, 11:18 AM

## 2017-05-14 NOTE — Progress Notes (Signed)
At approximately 0830 this morning, pt asked for the RN to call the police.  RN asked pt why he wanted her to call the police and RN did not understand his answer.  Pt was talking in circles and RN verbalized that she did not understand what he was trying to say and why he needed the police.  Pt began to become agitated and cussing at RN and security was called.  Pt has since calmed down and have not observed any further agitation so far today.  Hector ShadeMoss, Dwan Fennel [place]

## 2017-05-15 ENCOUNTER — Inpatient Hospital Stay (HOSPITAL_COMMUNITY): Payer: 59

## 2017-05-15 DIAGNOSIS — M6282 Rhabdomyolysis: Secondary | ICD-10-CM

## 2017-05-15 LAB — BASIC METABOLIC PANEL
ANION GAP: 11 (ref 5–15)
BUN: 8 mg/dL (ref 6–20)
CALCIUM: 9.4 mg/dL (ref 8.9–10.3)
CO2: 25 mmol/L (ref 22–32)
Chloride: 101 mmol/L (ref 101–111)
Creatinine, Ser: 1.02 mg/dL (ref 0.61–1.24)
GFR calc non Af Amer: >60 mL/min (ref >60)
Glucose, Bld: 109 mg/dL — ABNORMAL HIGH (ref 65–99)
Potassium: 3.9 mmol/L (ref 3.5–5.1)
SODIUM: 137 mmol/L (ref 135–145)

## 2017-05-15 LAB — CBC
HEMATOCRIT: 39.2 % (ref 39.0–52.0)
Hemoglobin: 12.7 g/dL — ABNORMAL LOW (ref 13.0–17.0)
MCH: 25.8 pg — ABNORMAL LOW (ref 26.0–34.0)
MCHC: 32.4 g/dL (ref 30.0–36.0)
MCV: 79.7 fL (ref 78.0–100.0)
PLATELETS: 457 10*3/uL — AB (ref 150–400)
RBC: 4.92 MIL/uL (ref 4.22–5.81)
RDW: 15.5 % (ref 11.5–15.5)
WBC: 11.5 10*3/uL — AB (ref 4.0–10.5)

## 2017-05-15 NOTE — Consult Note (Signed)
Patient's mother talked to the psychiatrist on the phone yesterday about her concerns about antipsychotics and his NMS reactions.  He was agitated last night and hydroxyzine ordered with relief.  Today, he is "pleasant and chatty" per his nurse.  Compliant with his Lithium and hydroxyzine.  Appears to be psychiatrically stable, if he is stable for the next 24 hours, he will be cleared from psychiatry.  Dr. Lucianne MussKumar has reviewed this patient and concurs with the plan.  Nanine MeansJamison Katrianna Friesenhahn, PMHNP

## 2017-05-15 NOTE — Progress Notes (Signed)
PROGRESS NOTE    Brandon Fuentes   ZOX:[id number]  DOB: 08-May-1990  DOA: 04/26/2017 PCP: Patient, No Pcp Per   Brief Narrative:  Brandon Fuentes is a 27 y/o with Bipolar disorder who presents from jail. His lithium had been discontinued for an elevated Lithium level a few days prior to this. He subsequently became combative and agitated prompting for him to be be sent to the ED. In the ED, CXR showed Pneumomediastinum with Pneumopericardium and s/c emphysema. Was cursing in ED and no history was able to be obtained from him.   Lithium level was 0.85.  He was sedated and intubated in the ED and then sent for a CT scan which reconfirmed above findings. [facility].  3/30Modified esophagram illustrating no esophageal perforation 4/1Extubated 4/3Radiographic resolution of pneumomediastinum and pneumopericardium 4/2NG tube placed andrestarted Lithium 4/6NG tubediscontinued 4/8Initiated liquid diet and repeat esophagram with no new findings 4/10Precedexdiscontinued  Subjective: He is happy today and has no complaints.    Assessment & Plan:   Principal Problem:   Bipolar I disorder, current or most recent episode manic, severe - per psych> Lithium 300 mg TID, Gabapentin 100 mg TID and Atarax 25 mg TID PRN - awaiting bed at psych facility but psych has seen him today and notes: "Appears to be psychiatrically stable, if he is stable for the next 24 hours, he will be cleared from psychiatry." - has h/o neurolept malignant syndrome and antipsychotics are being avoided  Active Problems:   Pneumomediastinum    Pneumopericardium - etiology undetermined- resolved     Elevated LFTs/ elevated CPK due to rhabdomyolysis - CPD 5459 >> 131  - AST 421 and ALT 145, T bili 2.6- were rechecked on 3/31 and were noted to be improving - hepatitis screen neg  Lactic acidosis - LA 4.25 on admission- improved to normal   Fever and leukocytosis  on 4/10  - temp  101.2 and WBC 21.9 - no cause found - PCT, UA and resp virus panel neg - blood cultures neg - WBC improving - CXR unrevealing on 4/10 but CXR today shows a RLL pneumonia- he has a mild cough- cont to watch without antibiotics   DVT prophylaxis: Heparin Code Status: Full code Family Communication: Disposition Plan: possibly home tomorrow- released from police custody Consultants:   PCCM  psych Procedures:   intubation Antimicrobials:  Anti-infectives (From admission, onward)   Start     Dose/Rate Route Frequency Ordered Stop   04/27/17 0600  piperacillin-tazobactam (ZOSYN) IVPB 3.375 g     3.375 g 12.5 mL/hr over 240 Minutes Intravenous Every 8 hours 04/27/17 0302 05/03/17 0145   04/26/17 1845  vancomycin (VANCOCIN) IVPB 1000 mg/200 mL premix     1,000 mg 200 mL/hr over 60 Minutes Intravenous  Once 04/26/17 1839 04/26/17 2304   04/26/17 1845  piperacillin-tazobactam (ZOSYN) IVPB 3.375 g     3.375 g 100 mL/hr over 30 Minutes Intravenous  Once 04/26/17 1839 04/26/17 2218     Objective: Vitals:   05/13/17 2150 05/14/17 0505 05/14/17 1506 05/14/17 2100  BP: 105/88 127/72 117/64 123/73  Pulse: 63 64 87 78  Resp: 16 18  18   Temp: (!) 97.4 F (36.3 C) 98.4 F (36.9 C) 98.2 F (36.8 C) 98.3 F (36.8 C)  TempSrc: Oral Oral Oral Oral  SpO2: 98% 98% 99% 100%  Weight:      Height:        Intake/Output Summary (Last 24 hours) at 05/15/2017 1715  Last data filed at 05/15/2017 0600 Gross per 24 hour  Intake 720 ml  Output -  Net 720 ml   Filed Weights   05/07/17 0347 05/08/17 1300 05/09/17 0500  Weight: 130.5 kg (287 lb 11.2 oz) 133.5 kg (294 lb 5 oz) 130.9 kg (288 lb 9.3 oz)    Examination: General exam: Appears comfortable  HEENT: PERRLA, oral mucosa moist, no sclera icterus or thrush Respiratory system: Clear to auscultation. Respiratory effort normal. Cardiovascular system: S1 & S2 heard, RRR.   Gastrointestinal system: Abdomen soft, non-tender, nondistended.  Normal bowel sound. No organomegaly Central nervous system: Alert and oriented. No focal neurological deficits. Extremities: No cyanosis, clubbing or edema Skin: No rashes or ulcers Psychiatry:  Mood & affect appropriate.     Data Reviewed: I have personally reviewed following labs and imaging studies  CBC: Recent Labs  Lab 05/09/17 0328 05/10/17 0727 05/15/17 0411  WBC 18.2* 11.6* 11.5*  HGB 13.6 13.1 12.7*  HCT 40.7 39.5 39.2  MCV 77.7* 78.5 79.7  PLT 240 278 457*   Basic Metabolic Panel: Recent Labs  Lab 05/09/17 0328 05/10/17 0727 05/13/17 0545 05/15/17 0411  NA 135 136 137 137  K 3.9 4.3 4.2 3.9  CL 102 103 102 101  CO2 23 24 24 25   GLUCOSE 127* 115* 112* 109*  BUN <5* 5* 5* 8  CREATININE 1.00 0.98 0.95 1.02  CALCIUM 8.6* 8.9 9.5 9.4  MG 2.1  --   --   --   PHOS 4.1  --   --   --    GFR: Estimated Creatinine Clearance: 158.4 mL/min (by C-G formula based on SCr of 1.02 mg/dL). Liver Function Tests: Recent Labs  Lab 05/09/17 0328  ALBUMIN 2.7*   No results for input(s): LIPASE, AMYLASE in the last 168 hours. No results for input(s): AMMONIA in the last 168 hours. Coagulation Profile: No results for input(s): INR, PROTIME in the last 168 hours. Cardiac Enzymes: No results for input(s): CKTOTAL, CKMB, CKMBINDEX, TROPONINI in the last 168 hours. BNP (last 3 results) No results for input(s): PROBNP in the last 8760 hours. HbA1C: No results for input(s): HGBA1C in the last 72 hours. CBG: No results for input(s): GLUCAP in the last 168 hours. Lipid Profile: No results for input(s): CHOL, HDL, LDLCALC, TRIG, CHOLHDL, LDLDIRECT in the last 72 hours. Thyroid Function Tests: No results for input(s): TSH, T4TOTAL, FREET4, T3FREE, THYROIDAB in the last 72 hours. Anemia Panel: No results for input(s): VITAMINB12, FOLATE, FERRITIN, TIBC, IRON, RETICCTPCT in the last 72 hours. Urine analysis:    Component Value Date/Time   COLORURINE YELLOW 05/08/2017 1809    APPEARANCEUR CLEAR 05/08/2017 1809   LABSPEC 1.009 05/08/2017 1809   PHURINE 8.0 05/08/2017 1809   GLUCOSEU NEGATIVE 05/08/2017 1809   HGBUR NEGATIVE 05/08/2017 1809   BILIRUBINUR NEGATIVE 05/08/2017 1809   KETONESUR NEGATIVE 05/08/2017 1809   PROTEINUR NEGATIVE 05/08/2017 1809   NITRITE NEGATIVE 05/08/2017 1809   LEUKOCYTESUR NEGATIVE 05/08/2017 1809   Sepsis Labs: @LABRCNTIP (procalcitonin:4,lacticidven:4) ) Recent Results (from the past 240 hour(s))  Respiratory Panel by PCR     Status: None   Collection Time: 05/08/17  9:20 AM  Result Value Ref Range Status   Adenovirus NOT DETECTED NOT DETECTED Final   Coronavirus 229E NOT DETECTED NOT DETECTED Final   Coronavirus HKU1 NOT DETECTED NOT DETECTED Final   Coronavirus NL63 NOT DETECTED NOT DETECTED Final   Coronavirus OC43 NOT DETECTED NOT DETECTED Final   Metapneumovirus NOT DETECTED NOT DETECTED  Final   Rhinovirus / Enterovirus NOT DETECTED NOT DETECTED Final   Influenza A NOT DETECTED NOT DETECTED Final   Influenza B NOT DETECTED NOT DETECTED Final   Parainfluenza Virus 1 NOT DETECTED NOT DETECTED Final   Parainfluenza Virus 2 NOT DETECTED NOT DETECTED Final   Parainfluenza Virus 3 NOT DETECTED NOT DETECTED Final   Parainfluenza Virus 4 NOT DETECTED NOT DETECTED Final   Respiratory Syncytial Virus NOT DETECTED NOT DETECTED Final   Bordetella pertussis NOT DETECTED NOT DETECTED Final   Chlamydophila pneumoniae NOT DETECTED NOT DETECTED Final   Mycoplasma pneumoniae NOT DETECTED NOT DETECTED Final    Comment: Performed at [facility], [address] [place], [place] [address]  Blood culture (routine x 2)     Status: None   Collection Time: 05/08/17 10:21 AM  Result Value Ref Range Status   Specimen Description BLOOD RIGHT ARM  Final   Special Requests   Final    BOTTLES DRAWN AEROBIC AND ANAEROBIC Blood Culture adequate volume   Culture   Final    NO GROWTH 5 DAYS Performed at [facility], [address], [place], [place] [address]    Report Status 05/13/2017 FINAL  Final  Blood culture (routine x 2)     Status: None   Collection Time: 05/08/17 10:22 AM  Result Value Ref Range Status   Specimen Description BLOOD RIGHT ARM  Final   Special Requests   Final    BOTTLES DRAWN AEROBIC AND ANAEROBIC Blood Culture adequate volume   Culture   Final    NO GROWTH 5 DAYS Performed at [facility], [address], [place], [place] [address]    Report Status 05/13/2017 FINAL  Final         Radiology Studies: Dg Chest 2 View  Result Date: 05/15/2017 CLINICAL DATA:  Pleuritic chest pain EXAM: CHEST - 2 VIEW COMPARISON:  05/08/2017 FINDINGS: Heart size is normal. The mediastinum is normal. Left chest is clear. There is right lower lobe infiltrate posteriorly consistent with pneumonia. Upper lungs are clear. IMPRESSION: Posterior right lower lobe pneumonia. Electronically Signed   By: Paulina Fusi M.D.   On: 05/15/2017 08:39   Dg Abd 1 View  Result Date: 05/15/2017 CLINICAL DATA:  Upper back pain. EXAM: ABDOMEN - 1 VIEW COMPARISON:  Radiograph of May 06, 2017. FINDINGS: The bowel gas pattern is normal. No radio-opaque calculi or other significant radiographic abnormality are seen. No significant osseous abnormality is noted. IMPRESSION: No evidence of bowel obstruction or ileus. Electronically Signed   By: Lupita Raider, M.D.   On: 05/15/2017 08:40      Scheduled Meds: . cloNIDine  0.1 mg Oral Q8H   Followed by  . cloNIDine  0.1 mg Oral Q12H   Followed by  . [START ON 05/18/2017] cloNIDine  0.1 mg Oral Daily  . feeding supplement (ENSURE ENLIVE)  237 mL Oral BID BM  . gabapentin  100 mg Oral TID  . heparin  5,000 Units Subcutaneous Q8H  . lithium citrate  300 mg Oral TID  . pantoprazole  40 mg Oral Daily   Continuous Infusions:   LOS: 19 days    Time spent in minutes: 45    Calvert Cantor, MD [facility] Pager: [email or web address] Password [email or web address] 05/15/2017, 5:15 PM

## 2017-05-16 NOTE — Progress Notes (Signed)
PROGRESS NOTE    Brandon Fuentes   ZOX:[id number]  DOB: [birth year]  DOA: 04/26/2017 PCP: Patient, No Pcp Per   Brief Narrative:  Brandon Fuentes is a 27 y/o with Bipolar disorder who presents from jail. His lithium had been discontinued for an elevated Lithium level a few days prior to this. He subsequently became combative and agitated prompting for him to be be sent to the ED. In the ED, CXR showed Pneumomediastinum with Pneumopericardium and s/c emphysema. Was cursing in ED and no history was able to be obtained from him.   Lithium level was 0.85.  He was sedated and intubated in the ED and then sent for a CT scan which reconfirmed above findings. He was transferred from [facility] to [facility].  3/30Modified esophagram illustrating no esophageal perforation 4/1Extubated 4/3Radiographic resolution of pneumomediastinum and pneumopericardium 4/2NG tube placed andrestarted Lithium 4/6NG tubediscontinued 4/8Initiated liquid diet and repeat esophagram with no new findings 4/10Precedexdiscontinued  Subjective: He has no complaints today.    Assessment & Plan:   Principal Problem:   Bipolar I disorder, current or most recent episode manic, severe - per psych> Lithium 300 mg TID, Gabapentin 100 mg TID and Atarax 25 mg TID PRN- has h/o neurolept malignant syndrome and antipsychotics are being avoided - 4/17> awaiting bed at psych facility but psych has seen him today and notes: "Appears to be psychiatrically stable, if he is stable for the next 24 hours, he will be cleared from psychiatry." - 4/18> awaiting psych to see today- he remains stable. I have texted Dr Lucianne Muss as well    Active Problems:   Pneumomediastinum    Pneumopericardium - etiology undetermined- resolved     Elevated LFTs/ elevated CPK due to rhabdomyolysis - CPD 5459 >> 131  - AST 421 and ALT 145, T bili 2.6- were rechecked on 3/31 and were noted to be improving - hepatitis screen neg  Lactic acidosis - LA 4.25  on admission- improved to normal   Fever and leukocytosis  on 4/10  - temp 101.2 and WBC 21.9 - no cause found - PCT, UA and resp virus panel neg - blood cultures neg - WBC improving - CXR unrevealing on 4/10 but CXR today shows a RLL pneumonia- he has a mild cough- cont to watch without antibiotics- remains stable   DVT prophylaxis: Heparin Code Status: Full code Family Communication: Disposition Plan: awaiting psych input Consultants:   PCCM  psych Procedures:   intubation Antimicrobials:  Anti-infectives (From admission, onward)   Start     Dose/Rate Route Frequency Ordered Stop   04/27/17 0600  piperacillin-tazobactam (ZOSYN) IVPB 3.375 g     3.375 g 12.5 mL/hr over 240 Minutes Intravenous Every 8 hours 04/27/17 0302 05/03/17 0145   04/26/17 1845  vancomycin (VANCOCIN) IVPB 1000 mg/200 mL premix     1,000 mg 200 mL/hr over 60 Minutes Intravenous  Once 04/26/17 1839 04/26/17 2304   04/26/17 1845  piperacillin-tazobactam (ZOSYN) IVPB 3.375 g     3.375 g 100 mL/hr over 30 Minutes Intravenous  Once 04/26/17 1839 04/26/17 2218     Objective: Vitals:   05/14/17 2100 05/15/17 2002 05/16/17 0550 05/16/17 1439  BP: 123/73 116/68 107/77 130/81  Pulse: 78 74 62 88  Resp: 18 18 18 18   Temp: 98.3 F (36.8 C) 98.9 F (37.2 C) 98.2 F (36.8 C) 98.1 F (36.7 C)  TempSrc: Oral Oral Oral Oral  SpO2: 100% 100% 100% 100%  Weight:      Height:  Intake/Output Summary (Last 24 hours) at 05/16/2017 1649 Last data filed at 05/15/2017 2003 Gross per 24 hour  Intake 220 ml  Output -  Net 220 ml   Filed Weights   05/07/17 0347 05/08/17 1300 05/09/17 0500  Weight: 130.5 kg (287 lb 11.2 oz) 133.5 kg (294 lb 5 oz) 130.9 kg (288 lb 9.3 oz)    Examination: General exam: Appears comfortable  HEENT: PERRLA, oral mucosa moist, no sclera icterus or thrush Respiratory system: Clear to auscultation. Respiratory effort normal. Cardiovascular system: S1 & S2 heard, RRR.     Gastrointestinal system: Abdomen soft, non-tender, nondistended. Normal bowel sound. No organomegaly Central nervous system: Alert and oriented. No focal neurological deficits. Extremities: No cyanosis, clubbing or edema Skin: No rashes or ulcers Psychiatry:  Mood & affect appropriate.     Data Reviewed: I have personally reviewed following labs and imaging studies  CBC: Recent Labs  Lab 05/10/17 0727 05/15/17 0411  WBC 11.6* 11.5*  HGB 13.1 12.7*  HCT 39.5 39.2  MCV 78.5 79.7  PLT 278 457*   Basic Metabolic Panel: Recent Labs  Lab 05/10/17 0727 05/13/17 0545 05/15/17 0411  NA 136 137 137  K 4.3 4.2 3.9  CL 103 102 101  CO2 24 24 25   GLUCOSE 115* 112* 109*  BUN 5* 5* 8  CREATININE 0.98 0.95 1.02  CALCIUM 8.9 9.5 9.4   GFR: Estimated Creatinine Clearance: 158.4 mL/min (by C-G formula based on SCr of 1.02 mg/dL). Liver Function Tests: No results for input(s): AST, ALT, ALKPHOS, BILITOT, PROT, ALBUMIN in the last 168 hours. No results for input(s): LIPASE, AMYLASE in the last 168 hours. No results for input(s): AMMONIA in the last 168 hours. Coagulation Profile: No results for input(s): INR, PROTIME in the last 168 hours. Cardiac Enzymes: No results for input(s): CKTOTAL, CKMB, CKMBINDEX, TROPONINI in the last 168 hours. BNP (last 3 results) No results for input(s): PROBNP in the last 8760 hours. HbA1C: No results for input(s): HGBA1C in the last 72 hours. CBG: No results for input(s): GLUCAP in the last 168 hours. Lipid Profile: No results for input(s): CHOL, HDL, LDLCALC, TRIG, CHOLHDL, LDLDIRECT in the last 72 hours. Thyroid Function Tests: No results for input(s): TSH, T4TOTAL, FREET4, T3FREE, THYROIDAB in the last 72 hours. Anemia Panel: No results for input(s): VITAMINB12, FOLATE, FERRITIN, TIBC, IRON, RETICCTPCT in the last 72 hours. Urine analysis:    Component Value Date/Time   COLORURINE YELLOW 05/08/2017 1809   APPEARANCEUR CLEAR 05/08/2017 1809    LABSPEC 1.009 05/08/2017 1809   PHURINE 8.0 05/08/2017 1809   GLUCOSEU NEGATIVE 05/08/2017 1809   HGBUR NEGATIVE 05/08/2017 1809   BILIRUBINUR NEGATIVE 05/08/2017 1809   KETONESUR NEGATIVE 05/08/2017 1809   PROTEINUR NEGATIVE 05/08/2017 1809   NITRITE NEGATIVE 05/08/2017 1809   LEUKOCYTESUR NEGATIVE 05/08/2017 1809   Sepsis Labs: @LABRCNTIP (procalcitonin:4,lacticidven:4) ) Recent Results (from the past 240 hour(s))  Respiratory Panel by PCR     Status: None   Collection Time: 05/08/17  9:20 AM  Result Value Ref Range Status   Adenovirus NOT DETECTED NOT DETECTED Final   Coronavirus 229E NOT DETECTED NOT DETECTED Final   Coronavirus HKU1 NOT DETECTED NOT DETECTED Final   Coronavirus NL63 NOT DETECTED NOT DETECTED Final   Coronavirus OC43 NOT DETECTED NOT DETECTED Final   Metapneumovirus NOT DETECTED NOT DETECTED Final   Rhinovirus / Enterovirus NOT DETECTED NOT DETECTED Final   Influenza A NOT DETECTED NOT DETECTED Final   Influenza B NOT DETECTED NOT DETECTED  Final   Parainfluenza Virus 1 NOT DETECTED NOT DETECTED Final   Parainfluenza Virus 2 NOT DETECTED NOT DETECTED Final   Parainfluenza Virus 3 NOT DETECTED NOT DETECTED Final   Parainfluenza Virus 4 NOT DETECTED NOT DETECTED Final   Respiratory Syncytial Virus NOT DETECTED NOT DETECTED Final   Bordetella pertussis NOT DETECTED NOT DETECTED Final   Chlamydophila pneumoniae NOT DETECTED NOT DETECTED Final   Mycoplasma pneumoniae NOT DETECTED NOT DETECTED Final    Comment: Performed at [facility], [address] [place], [place] [address]  Blood culture (routine x 2)     Status: None   Collection Time: 05/08/17 10:21 AM  Result Value Ref Range Status   Specimen Description BLOOD RIGHT ARM  Final   Special Requests   Final    BOTTLES DRAWN AEROBIC AND ANAEROBIC Blood Culture adequate volume   Culture   Final    NO GROWTH 5 DAYS Performed at [facility], [address] [place], [place] [address]     Report Status 05/13/2017 FINAL  Final  Blood culture (routine x 2)     Status: None   Collection Time: 05/08/17 10:22 AM  Result Value Ref Range Status   Specimen Description BLOOD RIGHT ARM  Final   Special Requests   Final    BOTTLES DRAWN AEROBIC AND ANAEROBIC Blood Culture adequate volume   Culture   Final    NO GROWTH 5 DAYS Performed at [facility], [address], [place], [place] [address]    Report Status 05/13/2017 FINAL  Final         Radiology Studies: Dg Chest 2 View  Result Date: 05/15/2017 CLINICAL DATA:  Pleuritic chest pain EXAM: CHEST - 2 VIEW COMPARISON:  05/08/2017 FINDINGS: Heart size is normal. The mediastinum is normal. Left chest is clear. There is right lower lobe infiltrate posteriorly consistent with pneumonia. Upper lungs are clear. IMPRESSION: Posterior right lower lobe pneumonia. Electronically Signed   By: Paulina Fusi M.D.   On: 05/15/2017 08:39   Dg Abd 1 View  Result Date: 05/15/2017 CLINICAL DATA:  Upper back pain. EXAM: ABDOMEN - 1 VIEW COMPARISON:  Radiograph of May 06, 2017. FINDINGS: The bowel gas pattern is normal. No radio-opaque calculi or other significant radiographic abnormality are seen. No significant osseous abnormality is noted. IMPRESSION: No evidence of bowel obstruction or ileus. Electronically Signed   By: Lupita Raider, M.D.   On: 05/15/2017 08:40      Scheduled Meds: . cloNIDine  0.1 mg Oral Q12H   Followed by  . [START ON 05/18/2017] cloNIDine  0.1 mg Oral Daily  . feeding supplement (ENSURE ENLIVE)  237 mL Oral BID BM  . gabapentin  100 mg Oral TID  . heparin  5,000 Units Subcutaneous Q8H  . lithium citrate  300 mg Oral TID  . pantoprazole  40 mg Oral Daily   Continuous Infusions:   LOS: 20 days    Time spent in minutes: 45    Calvert Cantor, MD [facility] Pager: [email or web address] Password [facility] 05/16/2017, 4:49 PM

## 2017-05-16 NOTE — Social Work (Signed)
CSW has renewed IVC, placed on chart.   Brandon HutchingIsabel H Marylu Fuentes, LCSWA [facility] Clinical Social Work [phone]

## 2017-05-16 NOTE — Social Work (Signed)
If medically cleared by psychiatry pt will either discharge into the custody of [employer] or discharge home.   CSW following for disposition.  Doy HutchingIsabel H Keria Widrig, LCSWA [facility] Clinical Social Work [phone]

## 2017-05-17 DIAGNOSIS — R109 Unspecified abdominal pain: Secondary | ICD-10-CM

## 2017-05-17 MED ORDER — GABAPENTIN 100 MG PO CAPS: 100.0000 mg | ORAL_CAPSULE | Freq: Three times a day (TID) | ORAL | 0 refills | Status: DC

## 2017-05-17 MED ORDER — LITHIUM CITRATE 300 MG/5 ML PO SYRP: 300.0000 mg | Freq: Three times a day (TID) | ORAL | 0 refills | Status: DC
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
## 2017-05-17 NOTE — Consult Note (Addendum)
[facility] Psych Consult Progress Note  05/17/2017 12:45 PM Brandon Fuentes  MRN:  [id number] Subjective:   Brandon Fuentes was last seen on 4/17. He was noted to be pleasant. He was recommended for discharge if he remained stable for 24 hours. He is currently still admitted and the primary team requests reassessment to determine if patient can be discharged. Gabapentin was reduced to 100 mg TID on 4/16 and standing Ativan was discontinued. Mother reports that Ativan causes disinhibition.   On interview, Brandon Fuentes reports that he is doing well. He denies SI, HI or AVH. He denies problems with sleep or appetite.  He has good insight about his illness.  He reports that he can tell when he is not himself and/or he is manic.  He reports feeling like the beginning of his hospitalization was a "daze."  He feels like he is at his baseline now.  He is able to explain the stressors that are related to his decompensation.  He speaks highly of his mother.  He plans to live with a romantic partner on discharge.  He receives SSDI.  He plans to follow up with [facility] for medication management.  He denies side effects with Lithium or Atarax.  He does confirm multiple side effects with antipsychotics in the past including NMS.   Principal Problem: Bipolar I disorder, current or most recent episode manic, severe (HCC) Diagnosis:   Patient Active Problem List   Diagnosis Date Noted  . Rhabdomyolysis [M62.82] 05/15/2017  . Pneumomediastinum (HCC) [J98.2]   . Pneumopericardium [I31.9]   . Elevated LFTs [R94.5]   . Bipolar disorder, curr episode mixed, severe, w/o psychotic features (HCC) [F31.63] 04/10/2016  . Bipolar I disorder, current or most recent episode manic, severe (HCC) [F31.13] 04/01/2016  . 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 Psychosis (HCC) [F29] 04/01/2016   Total Time spent with patient: 15 minutes  Past Psychiatric History: Bipolar 1 disorder  Past Medical History:  Past Medical History:   Diagnosis Date  . Bipolar 1 disorder (HCC)    History reviewed. No pertinent surgical history. Family History:  Family History  Problem Relation Age of Onset  . Diabetes Mother    Family Psychiatric  History:  Extensive history of alcoholism on maternal and paternal side.   Social History:  Social History   Substance and Sexual Activity  Alcohol Use No     Social History   Substance and Sexual Activity  Drug Use No    Social History   Socioeconomic History  . Marital status: Single    Spouse name: Not on file  . Number of children: Not on file  . Years of education: Not on file  . Highest education level: Not on file  Occupational History  . Not on file  Social Needs  . Financial resource strain: Patient refused  . Food insecurity:    Worry: Patient refused    Inability: Patient refused  . Transportation needs:    Medical: Patient refused    Non-medical: Patient refused  Tobacco Use  . Smoking status: Never Smoker  . Smokeless tobacco: Never Used  Substance and Sexual Activity  . Alcohol use: No  . Drug use: No  . Sexual activity: Yes    Birth control/protection: None  Lifestyle  . Physical activity:    Days per week: Not on file    Minutes per session: Not on file  . Stress: Not on file  Relationships  . Social connections:    Talks on phone:  Not on file    Gets together: Not on file    Attends religious service: Not on file    Active member of club or organization: Not on file    Attends meetings of clubs or organizations: Not on file    Relationship status: Not on file  Other Topics Concern  . Not on file  Social History Narrative  . Not on file    Sleep: Good  Appetite:  Good  Current Medications: Current Facility-Administered Medications  Medication Dose Route Frequency Provider Last Rate Last Dose  . acetaminophen (TYLENOL) solution 650 mg  650 mg Oral Q6H PRN Lonia Blood, MD   650 mg at 05/12/17 1607  . feeding supplement  (ENSURE ENLIVE) (ENSURE ENLIVE) liquid 237 mL  237 mL Oral BID BM Kalman Shan, MD   237 mL at 05/17/17 1011  . gabapentin (NEURONTIN) capsule 100 mg  100 mg Oral TID Nelly Rout, MD   100 mg at 05/17/17 1008  . heparin injection 5,000 Units  5,000 Units Subcutaneous Q8H Tobey Grim, NP   5,000 Units at 05/14/17 1400  . hydrOXYzine (ATARAX/VISTARIL) tablet 25 mg  25 mg Oral TID PRN Nelly Rout, MD   25 mg at 05/15/17 2212  . lithium citrate 300 MG/5ML solution 300 mg  300 mg Oral TID Lonia Blood, MD   300 mg at 05/17/17 1008  . LORazepam (ATIVAN) injection 1-2 mg  1-2 mg Intramuscular Q4H PRN Blount, Andi Devon T, NP      . ondansetron (ZOFRAN) injection 4 mg  4 mg Intravenous Q6H PRN Fuller Plan, MD   4 mg at 05/04/17 1024  . pantoprazole (PROTONIX) EC tablet 40 mg  40 mg Oral Daily Regalado, Belkys A, MD   40 mg at 05/17/17 1008  . polyethylene glycol (MIRALAX / GLYCOLAX) packet 17 g  17 g Oral Daily PRN Regalado, Belkys A, MD        Lab Results: No results found for this or any previous visit (from the past 48 hour(s)).  Blood Alcohol level:  Lab Results  Component Value Date   ETH <10 04/26/2017   ETH <5 06/04/2016    Musculoskeletal: Strength & Muscle Tone: within normal limits Gait & Station: UTA since patient was sitting in bed. Patient leans: N/A  Psychiatric Specialty Exam: Physical Exam  Nursing note and vitals reviewed. Constitutional: He is oriented to person, place, and time. He appears well-developed and well-nourished.  HENT:  Head: Normocephalic and atraumatic.  Neck: Normal range of motion.  Respiratory: Effort normal.  Musculoskeletal: Normal range of motion.  Neurological: He is alert and oriented to person, place, and time.  Skin: No rash noted.  Psychiatric: He has a normal mood and affect. His speech is normal and behavior is normal. Judgment and thought content normal. Cognition and memory are normal.    Review of Systems   Genitourinary: Positive for flank pain (Bilateral).  Psychiatric/Behavioral: Negative for depression, hallucinations and suicidal ideas. The patient does not have insomnia.   All other systems reviewed and are negative.   Blood pressure (!) 130/92, pulse 68, temperature 98 F (36.7 C), temperature source Oral, resp. rate 19, height 6\' 1"  (1.854 m), weight 130.9 kg (288 lb 9.3 oz), SpO2 90 %.Body mass index is 38.07 kg/m.  General Appearance: Fairly Groomed, young, African American male, wearing paper hospital bottoms with a bare chest who is sitting on the side of his bed. NAD.    Eye Contact:  Good  Speech:  Clear and Coherent and Normal Rate  Volume:  Normal  Mood:  Euthymic  Affect:  Appropriate and Congruent  Thought Process:  Goal Directed, Linear and Descriptions of Associations: Intact  Orientation:  Full (Time, Place, and Person)  Thought Content:  Logical  Suicidal Thoughts:  No  Homicidal Thoughts:  No  Memory:  Immediate;   Good Recent;   Good Remote;   Good  Judgement:  Fair  Insight:  Good  Psychomotor Activity:  Normal  Concentration:  Concentration: Good and Attention Span: Good  Recall:  Good  Fund of Knowledge:  Good  Language:  Good  Akathisia:  No  Handed:  Right  AIMS (if indicated):   N/A  Assets:  Communication Skills Desire for Improvement Financial Resources/Insurance Housing Social Support  ADL's:  Intact  Cognition:  WNL  Sleep:   Good   Assessment:  Brandon Fuentes is a 27 y.o. male who was admitted with AMS from jail. Lithium was reportedly discontinued 3-4 days prior to presentation due to supratherapeutic level. He required intubation for airway protection and was found to have pneumomediastinum and pneumopericardium. He was successfully extubated and an NG tube was placed but has since been removed. He is no longer requiring restraints and is less agitated. He was restarted on home Lithium and is receiving Gabapentin and Atarax for agitation  and anxiety as needed. Today, he is pleasant and cooperative with interview. He is organized in thought process and behavior. He does not endorse hyperreligious or delusional thoughts today. He is psychiatrically stable. He should continue his current medications and follow up with his outpatient provider.     Treatment Plan Summary: -Continue Lithium 300 mg TID for mood stabilization. Lithium level was 0.63 on 4/14.  -Continue Gabapentin 100 mg TID for agitation. -Continue Atarax 25 mg TID PRN for anxiety/agitation.  -Patient is psychiatrically cleared. He should follow up with his outpatient provider for further medication management.       Cherly BeachJacqueline J Elver Stadler, DO 05/17/2017, 12:45 PM

## 2017-05-17 NOTE — Progress Notes (Signed)
Pt discharged to [employer] in stable condition after going over discharge instructions with no concerns voiced. AVS given before leaving unit

## 2017-05-17 NOTE — Discharge Summary (Signed)
Physician Discharge Summary  Brandon Fuentes KVQ:[id number] DOB: [birth year] DOA: 04/26/2017  PCP: Patient, No Pcp Per  Admit date: 04/26/2017 Discharge date: 05/17/2017  [facility] From: prison Dis[facility]:  prison  Recommendations for Outpatient Follow-up:  1. Needs to f/u with his psychiatrist in 1-2 wks 2. Lithium level in 1 wk Discharge Condition:  stable   CODE STATUS:  Full code   Consultations:  Psych  PCCM    Discharge Diagnoses:  Principal Problem:   Bipolar I disorder, current or most recent episode manic, severe (HCC) Active Problems:   Pneumomediastinum (HCC)   Pneumopericardium   Elevated LFTs   Rhabdomyolysis    Brandon Fuentes is a 27 y/o with Bipolar disorder who presents from jail. His lithium had been discontinued for an elevated Lithium level a few days prior to this. He subsequently became combative and agitated prompting for him to be be sent to the ED. In the ED, CXR showed Pneumomediastinum with Pneumopericardium and s/c emphysema. Was cursing in ED and no history was able to be obtained from him.   Lithium level was 0.85.  He was sedated and intubated in the ED and then sent for a CT scan which reconfirmed above findings. He was transferred from [facility] to [facility].  3/30Modified esophagram illustrating no esophageal perforation 4/1Extubated 4/3Radiographic resolution of pneumomediastinum and pneumopericardium 4/2NG tube placed andrestarted Lithium 4/6NG tubediscontinued 4/8Initiated liquid diet and repeat esophagram with no new findings 4/10Precedexdiscontinued  Subjective: He has no complaints today. Very happy.    Assessment & Plan:   Principal Problem:   Bipolar I disorder, current or most recent episode manic, severe - per psych> Lithium 300 mg TID, Gabapentin 100 mg TID and Atarax 25 mg TID PRN - has h/o neurolept malignant syndrome and antipsychotics are being avoided - 4/17> awaiting bed at psych facility but psych has seen him today  and notes: "Appears to be psychiatrically stable, if he is stable for the next 24 hours, he will be cleared from psychiatry." - 4/18> awaiting psych to see today- he remains stable. I have texted Dr Lucianne Muss as well  - 4/19- psych has cleared him - d/c meds > Litium 300 TID and GAbapentin 100 TID- He had an order for Atarax PRN but has not needed any in the hospital which is why I have not prescribed it.    Active Problems:   Pneumomediastinum    Pneumopericardium - etiology undetermined- resolved     Elevated LFTs/ elevated CPK due to rhabdomyolysis - CPD 5459 >> 131  - AST 421 and ALT 145, T bili 2.6- were rechecked on 3/31 and were noted to be improving - hepatitis screen neg  Lactic acidosis - LA 4.25 on admission- improved to normal   Fever and leukocytosis  on 4/10  - temp 101.2 and WBC 21.9 - no cause found - PCT, UA and resp virus panel neg - blood cultures neg - WBC improving - CXR unrevealing on 4/10 but CXR today shows a RLL pneumonia- he has a mild cough- cont to watch without antibiotics- remains stable   Discharge Exam: Vitals:   05/17/17 0525 05/17/17 1411  BP: (!) 130/92 (!) 147/93  Pulse: 68 77  Resp: 19   Temp: 98 F (36.7 C) 98.4 F (36.9 C)  SpO2: 90% 100%   Vitals:   05/16/17 1439 05/16/17 2122 05/17/17 0525 05/17/17 1411  BP: 130/81 132/88 (!) 130/92 (!) 147/93  Pulse: 88 77 68 77  Resp: 18 18 19    Temp: 98.1 F (  36.7 C) 98.4 F (36.9 C) 98 F (36.7 C) 98.4 F (36.9 C)  TempSrc: Oral Oral Oral Oral  SpO2: 100% 100% 90% 100%  Weight:      Height:        General: Pt is alert, awake, not in acute distress Cardiovascular: RRR, S1/S2 +, no rubs, no gallops Respiratory: CTA bilaterally, no wheezing, no rhonchi Abdominal: Soft, NT, ND, bowel sounds + Extremities: no edema, no cyanosis   Discharge Instructions  Discharge Instructions    Diet - low sodium heart healthy   Complete by:  As directed    Increase activity slowly   Complete  by:  As directed      Allergies as of 05/17/2017      Reactions   Depakote [divalproex Sodium] Other (See Comments)   Sleepiness, anger   Geodon [ziprasidone Hcl] Other (See Comments)   Per mom pt get very agitated   Haldol [haloperidol]    Per mother--caused NMS neuroleptic malignant syndrome   Risperidone And Related    Per mother caused Neuroleptic malignant syndrome   Zoloft [sertraline Hcl]    anger      Medication List    STOP taking these medications   amantadine 100 MG capsule Commonly known as:  SYMMETREL   ARIPiprazole 20 MG tablet Commonly known as:  ABILIFY   clonazePAM 0.5 MG tablet Commonly known as:  KLONOPIN   dicyclomine 20 MG tablet Commonly known as:  BENTYL   divalproex 500 MG DR tablet Commonly known as:  DEPAKOTE   hydrOXYzine 100 MG capsule Commonly known as:  VISTARIL   hydrOXYzine 25 MG tablet Commonly known as:  ATARAX/VISTARIL   lithium 600 MG capsule   lithium carbonate 300 MG capsule   mirtazapine 7.5 MG tablet Commonly known as:  REMERON   ondansetron 4 MG tablet Commonly known as:  ZOFRAN   OXcarbazepine 150 MG tablet Commonly known as:  TRILEPTAL     TAKE these medications   gabapentin 100 MG capsule Commonly known as:  NEURONTIN Take 1 capsule (100 mg total) by mouth 3 (three) times daily.   lithium citrate 300 MG/5ML solution Take 5 mLs (300 mg total) by mouth 3 (three) times daily.       Allergies  Allergen Reactions  . Depakote [Divalproex Sodium] Other (See Comments)    Sleepiness, anger  . Geodon [Ziprasidone Hcl] Other (See Comments)    Per mom pt get very agitated  . 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 Risperidone And Related     Per mother caused Neuroleptic malignant syndrome  . Zoloft [Sertraline Hcl]     anger     Procedures/Studies:  Intubation/ Extubation  Dg Chest 2 View  Result Date: 05/15/2017 CLINICAL DATA:  Pleuritic chest pain EXAM:  CHEST - 2 VIEW COMPARISON:  05/08/2017 FINDINGS: Heart size is normal. The mediastinum is normal. Left chest is clear. There is right lower lobe infiltrate posteriorly consistent with pneumonia. Upper lungs are clear. IMPRESSION: Posterior right lower lobe pneumonia. Electronically Signed   By: Paulina Fusi M.D.   On: 05/15/2017 08:39   Dg Abd 1 View  Result Date: 05/15/2017 CLINICAL DATA:  Upper back pain. EXAM: ABDOMEN - 1 VIEW COMPARISON:  Radiograph of May 06, 2017. FINDINGS: The bowel gas pattern is normal. No radio-opaque calculi or other significant radiographic abnormality are seen. No significant osseous abnormality is noted. IMPRESSION: No evidence of bowel obstruction or ileus. Electronically Signed  By: Lupita Raider, M.D.   On: 05/15/2017 08:40   Ct Head Wo Contrast  Result Date: 04/26/2017 CLINICAL DATA:  Altered mental status EXAM: CT HEAD WITHOUT CONTRAST TECHNIQUE: Contiguous axial images were obtained from the base of the skull through the vertex without intravenous contrast. COMPARISON:  None. FINDINGS: Brain: No mass lesion, intraparenchymal hemorrhage or extra-axial collection. No evidence of acute cortical infarct. Normal appearance of the brain parenchyma and extra axial spaces for age. Vascular: No hyperdense vessel or unexpected vascular calcification. 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  Normal calvarium. Sinuses/Orbits: No sinus fluid levels or advanced mucosal thickening. No mastoid effusion. Normal orbits. IMPRESSION: Normal brain. Electronically Signed   By: Deatra Robinson M.D.   On: 04/26/2017 21:02   Ct Soft Tissue Neck W Contrast  Result Date: 04/26/2017 CLINICAL DATA:  Vomiting around endotracheal tube. EXAM: CT NECK WITH CONTRAST TECHNIQUE: Multidetector CT imaging of the neck was performed using the standard protocol following the bolus administration of intravenous contrast. CONTRAST:  80mL ISOVUE-300 IOPAMIDOL (ISOVUE-300) INJECTION 61% COMPARISON:   None. FINDINGS: PHARYNX AND LARYNX: The endotracheal tube tip is in the proximal right mainstem bronchus. Nasopharyngeal opacification is common in the setting of endotracheal intubation. Nasogastric tube courses below the field of view. No focal laryngeal or pharyngeal abnormality otherwise. SALIVARY GLANDS: --Parotid: No mass lesion or inflammation. No sialolithiasis or ductal dilatation. Multifocal free air in the carotid spaces. --Submandibular: Symmetric without inflammation. No sialolithiasis or ductal dilatation. Multifocal free air in both submandibular spaces. --Sublingual: Normal. No ranula or other visible lesion of the base of tongue and floor of mouth. THYROID: Normal. LYMPH NODES: No enlarged or abnormal density lymph nodes. VASCULAR: Major cervical vessels are patent. LIMITED INTRACRANIAL: Normal. VISUALIZED ORBITS: Normal. MASTOIDS AND VISUALIZED PARANASAL SINUSES: No fluid levels or advanced mucosal thickening. No mastoid effusion. SKELETON: No bony spinal canal stenosis. No lytic or blastic lesions. UPPER CHEST: There is a small focal defect of the posterior trachea just below the thoracic inlet (series 4, image 102, 105). OTHER: There is extensive emphysema throughout the deep spaces of the neck. IMPRESSION: 1. Small defect of the posterior aspect of the trachea just below the level of the thoracic inlet may indicate a small tracheal perforation, which is a potential source for the extensive pneumomediastinum and emphysema of the deep spaces of the neck. If there is no history of trauma, however, an esophageal perforation is probably still more likely, since the pneumomediastinum was present prior to intubation. 2. Endotracheal tube tip is in the proximal right mainstem bronchus. Electronically Signed   By: Deatra Robinson M.D.   On: 04/26/2017 20:59   Ct Chest W Contrast  Result Date: 04/27/2017 CLINICAL DATA:  Evaluate for esophageal injury.  Pneumomediastinum. EXAM: CT CHEST WITH CONTRAST  TECHNIQUE: Multidetector CT imaging of the chest was performed during intravenous contrast administration. CONTRAST:  75mL ISOVUE-300 IOPAMIDOL (ISOVUE-300) INJECTION 61% COMPARISON:  CT chest 04/26/2017 FINDINGS: Cardiovascular: No significant vascular findings. Normal heart size. No pericardial effusion. Mediastinum/Nodes: No enlarged mediastinal, hilar, or axillary lymph nodes. Thyroid gland and trachea are normal. Endotracheal tube in satisfactory position. Nasogastric tube with the tip terminating in the mid esophagus. Oral contrast was hand injected through the nasogastric tube. No contrast within the esophagus. Contrast is present within the stomach. No extraluminal contrast in the mediastinum. Pneumomediastinum and pneumopericardium with extension into the neck soft tissue. Lungs/Pleura: Bibasilar atelectasis. No pleural effusion or pneumothorax. No pneumothorax. Upper Abdomen: No acute abnormality.  Musculoskeletal: No acute osseous abnormality. No aggressive osseous lesion. IMPRESSION: 1. Pneumomediastinum and pneumopericardium with extension into the neck soft tissue. Nasogastric tube with the tip terminating in the mid esophagus. Oral contrast was hand injected through the nasogastric tube. No contrast within the esophagus. Contrast is present within the stomach. No extraluminal contrast in the mediastinum to suggest perforation. The upper esophagus is suboptimally evaluated given that the nasogastric tube terminates in the mid esophagus. Electronically Signed   By: Elige KoHetal  Patel   On: 04/27/2017 14:52   Ct Chest W Contrast  Result Date: 04/26/2017 CLINICAL DATA:  Generalized abdominal pain, elevated liver function tests. Pneumomediastinum. EXAM: CT CHEST, ABDOMEN, AND PELVIS WITH CONTRAST TECHNIQUE: Multidetector CT imaging of the chest, abdomen and pelvis was performed following the standard protocol during bolus administration of intravenous contrast. However, arms could not be brought above the  patient's head, resulting in artifact. CONTRAST:  80mL ISOVUE-300 IOPAMIDOL (ISOVUE-300) INJECTION 61% COMPARISON:  Radiograph of same day. FINDINGS: CT CHEST FINDINGS Cardiovascular: No significant vascular findings. Normal heart size. No pericardial effusion. Mediastinum/Nodes: Endotracheal tube seen directed into right mainstem bronchus. Nasogastric tube is seen going down the esophagus, which is looped in the stomach and distal tip is seen extending back to proximal esophagus. Extensive pneumomediastinum is noted as well as pneumopericardium. Extensive soft tissue gas is seen in the supraclavicular region and cervical region. Lungs/Pleura: No pneumothorax or pleural effusion is noted. Lungs are clear. Musculoskeletal: No chest wall mass or suspicious bone lesions identified. CT ABDOMEN PELVIS FINDINGS Hepatobiliary: No focal liver abnormality is seen. No gallstones, gallbladder wall thickening, or biliary dilatation. Pancreas: Unremarkable. No pancreatic ductal dilatation or surrounding inflammatory changes. Spleen: Normal in size without focal abnormality. Adrenals/Urinary Tract: Adrenal glands are unremarkable. Kidneys are normal, without renal calculi, focal lesion, or hydronephrosis. Bladder is unremarkable. Stomach/Bowel: Stomach is within normal limits. Appendix appears normal. No evidence of bowel wall thickening, distention, or inflammatory changes. Vascular/Lymphatic: No significant vascular findings are present. No enlarged abdominal or pelvic lymph nodes. Reproductive: Prostate is unremarkable. Other: No abdominal wall hernia or abnormality. No abdominopelvic ascites. Musculoskeletal: No acute or significant osseous findings. IMPRESSION: Extensive pneumomediastinum and pneumopericardium is noted. Extensive soft tissue gas is seen in the supraclavicular and cervical soft tissues. This is concerning for rupture of bronchus or esophagus. Endotracheal tube is directed into right mainstem bronchus;  withdrawal by 3-4 cm is recommended. Critical Value/emergent results were called by telephone at the time of interpretation on 04/26/2017 at 9:03 pm to Dr. Chaney MallingAVID YAO , who verbally acknowledged these results. Nasogastric tube is seen looped within the stomach, with distal tip directed back into the esophagus and positioned within proximal esophagus. No definite abnormality seen in the abdomen or pelvis. Electronically Signed   By: Lupita RaiderJames  Green Jr, M.D.   On: 04/26/2017 21:05   Ct Abdomen Pelvis W Contrast  Result Date: 04/26/2017 CLINICAL DATA:  Generalized abdominal pain, elevated liver function tests. Pneumomediastinum. EXAM: CT CHEST, ABDOMEN, AND PELVIS WITH CONTRAST TECHNIQUE: Multidetector CT imaging of the chest, abdomen and pelvis was performed following the standard protocol during bolus administration of intravenous contrast. However, arms could not be brought above the patient's head, resulting in artifact. CONTRAST:  80mL ISOVUE-300 IOPAMIDOL (ISOVUE-300) INJECTION 61% COMPARISON:  Radiograph of same day. FINDINGS: CT CHEST FINDINGS Cardiovascular: No significant vascular findings. Normal heart size. No pericardial effusion. Mediastinum/Nodes: Endotracheal tube seen directed into right mainstem bronchus. Nasogastric tube is seen going down the esophagus, which is looped in the stomach  and distal tip is seen extending back to proximal esophagus. Extensive pneumomediastinum is noted as well as pneumopericardium. Extensive soft tissue gas is seen in the supraclavicular region and cervical region. Lungs/Pleura: No pneumothorax or pleural effusion is noted. Lungs are clear. Musculoskeletal: No chest wall mass or suspicious bone lesions identified. CT ABDOMEN PELVIS FINDINGS Hepatobiliary: No focal liver abnormality is seen. No gallstones, gallbladder wall thickening, or biliary dilatation. Pancreas: Unremarkable. No pancreatic ductal dilatation or surrounding inflammatory changes. Spleen: Normal in size  without focal abnormality. Adrenals/Urinary Tract: Adrenal glands are unremarkable. Kidneys are normal, without renal calculi, focal lesion, or hydronephrosis. Bladder is unremarkable. Stomach/Bowel: Stomach is within normal limits. Appendix appears normal. No evidence of bowel wall thickening, distention, or inflammatory changes. Vascular/Lymphatic: No significant vascular findings are present. No enlarged abdominal or pelvic lymph nodes. Reproductive: Prostate is unremarkable. Other: No abdominal wall hernia or abnormality. No abdominopelvic ascites. Musculoskeletal: No acute or significant osseous findings. IMPRESSION: Extensive pneumomediastinum and pneumopericardium is noted. Extensive soft tissue gas is seen in the supraclavicular and cervical soft tissues. This is concerning for rupture of bronchus or esophagus. Endotracheal tube is directed into right mainstem bronchus; withdrawal by 3-4 cm is recommended. Critical Value/emergent results were called by telephone at the time of interpretation on 04/26/2017 at 9:03 pm to Dr. Chaney Malling , who verbally acknowledged these results. Nasogastric tube is seen looped within the stomach, with distal tip directed back into the esophagus and positioned within proximal esophagus. No definite abnormality seen in the abdomen or pelvis. Electronically Signed   By: Lupita Raider, M.D.   On: 04/26/2017 21:05   Dg Chest Port 1 View  Result Date: 05/08/2017 CLINICAL DATA:  Shortness of breath. EXAM: PORTABLE CHEST 1 VIEW COMPARISON:  Chest x-ray dated May 03, 2017. FINDINGS: Interval removal of the nasogastric tube. Stable cardiomediastinal silhouette. Low lung volumes with bronchovascular crowding and mild bibasilar atelectasis. No focal consolidation, pleural effusion, or pneumothorax. No acute osseous abnormality. IMPRESSION: Low lung volumes.  No active disease. Electronically Signed   By: Obie Dredge M.D.   On: 05/08/2017 10:54   Dg Chest Port 1 View  Result  Date: 05/03/2017 CLINICAL DATA:  Shortness of breath EXAM: PORTABLE CHEST 1 VIEW COMPARISON:  05/02/2017 FINDINGS: Nasogastric tube tip and side port are below the diaphragm. The tip is probably at the gastroduodenal junction. No focal airspace consolidation or pulmonary edema. Unchanged cardiomediastinal contours. IMPRESSION: Nasogastric tube tip at the expected location of the gastroduodenal junction. Electronically Signed   By: Deatra Robinson M.D.   On: 05/03/2017 06:56   Dg Chest Portable 1 View  Result Date: 05/02/2017 CLINICAL DATA:  Pneumopericardium EXAM: PORTABLE CHEST 1 VIEW COMPARISON:  Chest radiograph 05/01/2017 FINDINGS: The heart size and mediastinal contours are within normal limits. Both lungs are clear. No pneumothorax or pneumopericardium. The visualized skeletal structures are unremarkable. IMPRESSION: No pneumothorax or pneumopericardium.  No focal airspace disease. Electronically Signed   By: Deatra Robinson M.D.   On: 05/02/2017 06:32   Dg Chest Port 1 View  Result Date: 05/01/2017 CLINICAL DATA:  Respiratory failure. EXAM: PORTABLE CHEST 1 VIEW COMPARISON:  One-view chest x-ray 04/30/2017. FINDINGS: The heart size is exaggerated by low lung volumes. The patient has been extubated. Bibasilar airspace disease is slightly increased, right greater than left. There is no edema. The visualized soft tissues and bony thorax are unremarkable. IMPRESSION: 1. Interval extubation. 2. Slight increase and right greater than left basilar airspace disease, likely atelectasis. Electronically Signed  By: Marin Roberts M.D.   On: 05/01/2017 09:15   Dg Chest Port 1 View  Result Date: 04/30/2017 CLINICAL DATA:  Hypoxia EXAM: PORTABLE CHEST 1 VIEW COMPARISON:  April 29, 2017 FINDINGS: Endotracheal tube tip is 2.2 cm above the carina. Nasogastric tube tip and side port are below the diaphragm. No pneumothorax. There is atelectatic change in the left base. Lungs elsewhere clear. Heart is mildly enlarged  with pulmonary vascularity within normal limits. No adenopathy. No bone lesions. A small amount of air remains in the supraclavicular region on the left. IMPRESSION: Tube positions as described without pneumothorax. Left base atelectasis. No new opacity. Stable cardiomegaly. Soft tissue air in the left supraclavicular region remains. Electronically Signed   By: Bretta Bang III M.D.   On: 04/30/2017 08:23   Dg Chest Port 1 View  Result Date: 04/29/2017 CLINICAL DATA:  Respiratory failure EXAM: PORTABLE CHEST 1 VIEW COMPARISON:  04/28/2017 FINDINGS: Endotracheal tube 15 mm above the carina. Recommend withdrawal 2 cm. NG tube in the stomach. Decreased lung volume compared to the prior study. Slight increase in bibasilar atelectasis. Negative for edema or effusion IMPRESSION: Low endotracheal tube, recommend withdrawal 2 cm Hypoventilation with bibasilar atelectasis slightly increased from the prior study. Electronically Signed   By: Marlan Palau M.D.   On: 04/29/2017 08:00   Dg Chest Port 1 View  Result Date: 04/28/2017 CLINICAL DATA:  Respiratory failure EXAM: PORTABLE CHEST 1 VIEW COMPARISON:  Yesterday FINDINGS: Endotracheal tube tip at the clavicular heads. An orogastric tube reaches the stomach. Low volume chest with mild streaky density at the bases correlating with atelectasis on prior chest CT. Pneumomediastinum without detected change. No visible pneumothorax. IMPRESSION: 1. Low volume chest with mild atelectasis. 2. Pneumomediastinum without detected progression. No visible pneumothorax. 3. Unremarkable tube positioning. Electronically Signed   By: Marnee Spring M.D.   On: 04/28/2017 08:35   Dg Chest Port 1 View  Result Date: 04/27/2017 CLINICAL DATA:  Ventilator dependent. EXAM: PORTABLE CHEST 1 VIEW COMPARISON:  04/26/2017 FINDINGS: Endotracheal tube in satisfactory position. Cardiomediastinal silhouette is normal. Mediastinal contours appear intact. Persistent pneumopericardium and  pneumomediastinum. Osseous structures are without acute abnormality. Soft tissue emphysema tracking to the neck. IMPRESSION: Persistent pneumopericardium and pneumomediastinum. Soft tissue emphysema tracking to the neck. Electronically Signed   By: Ted Mcalpine M.D.   On: 04/27/2017 02:57   Dg Chest Port 1 View  Result Date: 04/26/2017 CLINICAL DATA:  ETT placement EXAM: PORTABLE CHEST 1 VIEW COMPARISON:  CT 04/26/2017, chest x-ray 04/26/2017 FINDINGS: Endotracheal tube tip is about 2.4 cm superior to the carina. Esophageal tube has been removed. Pneumopericardium and pneumomediastinum with subcutaneous emphysema in the neck. No consolidation or effusion. Stable cardiomediastinal silhouette IMPRESSION: 1. Endotracheal tube tip is about 2.4 cm superior to the carina 2. Removal of esophageal tube 3. Pneumopericardium and pneumomediastinum with large amount of soft tissue emphysema at the neck Electronically Signed   By: Jasmine Pang M.D.   On: 04/26/2017 21:19   Dg Chest Port 1 View  Result Date: 04/26/2017 CLINICAL DATA:  ETT placement EXAM: PORTABLE CHEST 1 VIEW COMPARISON:  04/26/2017 FINDINGS: Endotracheal tube tip is about 1 cm superior to the carina. Esophageal tube appears looped back upon itself, side-port overlies the upper esophagus. No consolidation or pleural effusion. Pneumomediastinum with subcutaneous emphysema in the supraclavicular fossa. No discrete pneumothorax is seen. IMPRESSION: 1. Endotracheal tube tip about 1 cm superior to carina 2. Esophageal tube is looped upon itself and the side port  appears to be positioned over the upper mediastinum/esophagus. Repositioning recommended 3. Pneumomediastinum with moderate subcutaneous emphysema in the neck and supraclavicular fossa as before Electronically Signed   By: Jasmine Pang M.D.   On: 04/26/2017 19:47   Dg Chest Port 1 View  Result Date: 04/26/2017 CLINICAL DATA:  Leukocytosis EXAM: PORTABLE CHEST 1 VIEW COMPARISON:  None.  FINDINGS: Pneumomediastinum with soft tissue emphysema extending into the neck. No pneumothorax. No pleural effusion or focal consolidation. Normal cardiomediastinal silhouette. No acute osseous abnormality. IMPRESSION: Pneumomediastinum and soft tissue emphysema extending into the neck soft tissues. Differential considerations include perforated esophagus versus tracheobronchial injury versus penetrating injury. Critical Value/emergent results were called by telephone at the time of interpretation on 04/26/2017 at 6:39pm to Dr. Chaney Malling , who verbally acknowledged these results. Electronically Signed   By: Elige Ko   On: 04/26/2017 18:41   Dg Abd Portable 1v  Result Date: 05/06/2017 CLINICAL DATA:  Abdominal pain EXAM: PORTABLE ABDOMEN - 1 VIEW COMPARISON:  05/02/2017 FINDINGS: The bowel gas pattern is normal. No radio-opaque calculi or other significant radiographic abnormality are seen. NG tube is no longer visible. IMPRESSION: Negative. Electronically Signed   By: Elsie Stain M.D.   On: 05/06/2017 07:14   Dg Abd Portable 1v  Result Date: 05/02/2017 CLINICAL DATA:  NG placement EXAM: PORTABLE ABDOMEN - 1 VIEW COMPARISON:  Chest 05/02/2017 FINDINGS: NG tube is coiled in the stomach with the tip near the gastric antrum. Gas in mildly distended colon compatible with ileus. IMPRESSION: NG tube coiled in the stomach with the tip near the gastric antrum. Mild ileus. Electronically Signed   By: Marlan Palau M.D.   On: 05/02/2017 12:36   US Abdomen Limited Ruq  Result Date: 04/26/2017 CLINICAL DATA:  Increased LFTs EXAM: ULTRASOUND ABDOMEN LIMITED RIGHT UPPER QUADRANT COMPARISON:  None. FINDINGS: Gallbladder: No gallstones or wall thickening visualized. No sonographic Murphy sign noted by sonographer. Common bile duct: Diameter: 4.5 mm Liver: No focal lesion identified. Increased hepatic parenchymal echogenicity. Portal vein is patent on color Doppler imaging with normal direction of blood flow towards  the liver. IMPRESSION: 1. No cholelithiasis or sonographic evidence of acute cholecystitis. 2. Increased hepatic echogenicity as can be seen with hepatic steatosis. Electronically Signed   By: Elige Ko   On: 04/26/2017 19:17   Dg Esophagus W/water Sol Cm  Result Date: 05/06/2017 CLINICAL DATA:  Pneumomediastinum EXAM: ESOPHOGRAM/BARIUM SWALLOW TECHNIQUE: Single contrast examination was performed using water-soluble contrast. FLUOROSCOPY TIME:  Fluoroscopy Time:  1 minutes Radiation Exposure Index (if provided by the fluoroscopic device): Number of Acquired Spot Images: 0 COMPARISON:  None. FINDINGS: Study is suboptimal and very limited as the patient refused to swallow the contrast. Only 1 swallow of a small amount of contrast is noted and no visible extravasation of contrast. IMPRESSION: Very limited and suboptimal study due to the patient's refusal to drink the contrast. No visible extravasation noted on 1 single small swallow of contrast. Electronically Signed   By: Charlett Nose M.D.   On: 05/06/2017 11:03     The results of significant diagnostics from this hospitalization (including imaging, microbiology, ancillary and laboratory) are listed below for reference.     Microbiology: Recent Results (from the past 240 hour(s))  Respiratory Panel by PCR     Status: None   Collection Time: 05/08/17  9:20 AM  Result Value Ref Range Status   Adenovirus NOT DETECTED NOT DETECTED Final   Coronavirus 229E NOT DETECTED NOT DETECTED Final  Coronavirus HKU1 NOT DETECTED NOT DETECTED Final   Coronavirus NL63 NOT DETECTED NOT DETECTED Final   Coronavirus OC43 NOT DETECTED NOT DETECTED Final   Metapneumovirus NOT DETECTED NOT DETECTED Final   Rhinovirus / Enterovirus NOT DETECTED NOT DETECTED Final   Influenza A NOT DETECTED NOT DETECTED Final   Influenza B NOT DETECTED NOT DETECTED Final   Parainfluenza Virus 1 NOT DETECTED NOT DETECTED Final   Parainfluenza Virus 2 NOT DETECTED NOT DETECTED Final    Parainfluenza Virus 3 NOT DETECTED NOT DETECTED Final   Parainfluenza Virus 4 NOT DETECTED NOT DETECTED Final   Respiratory Syncytial Virus NOT DETECTED NOT DETECTED Final   Bordetella pertussis NOT DETECTED NOT DETECTED Final   Chlamydophila pneumoniae NOT DETECTED NOT DETECTED Final   Mycoplasma pneumoniae NOT DETECTED NOT DETECTED Final    Comment: Performed at [facility], [address] [place], [place] [address]  Blood culture (routine x 2)     Status: None   Collection Time: 05/08/17 10:21 AM  Result Value Ref Range Status   Specimen Description BLOOD RIGHT ARM  Final   Special Requests   Final    BOTTLES DRAWN AEROBIC AND ANAEROBIC Blood Culture adequate volume   Culture   Final    NO GROWTH 5 DAYS Performed at [facility], [address], [place], [place] [address]    Report Status 05/13/2017 FINAL  Final  Blood culture (routine x 2)     Status: None   Collection Time: 05/08/17 10:22 AM  Result Value Ref Range Status   Specimen Description BLOOD RIGHT ARM  Final   Special Requests   Final    BOTTLES DRAWN AEROBIC AND ANAEROBIC Blood Culture adequate volume   Culture   Final    NO GROWTH 5 DAYS Performed at [facility], [address] [place], [place] [address]    Report Status 05/13/2017 FINAL  Final     Labs: BNP (last 3 results) No results for input(s): BNP in the last 8760 hours. Basic Metabolic Panel: Recent Labs  Lab 05/13/17 0545 05/15/17 0411  NA 137 137  K 4.2 3.9  CL 102 101  CO2 24 25  GLUCOSE 112* 109*  BUN 5* 8  CREATININE 0.95 1.02  CALCIUM 9.5 9.4   Liver Function Tests: No results for input(s): AST, ALT, ALKPHOS, BILITOT, PROT, ALBUMIN in the last 168 hours. No results for input(s): LIPASE, AMYLASE in the last 168 hours. No results for input(s): AMMONIA in the last 168 hours. CBC: Recent Labs  Lab 05/15/17 0411  WBC 11.5*  HGB 12.7*  HCT 39.2  MCV 79.7  PLT 457*   Cardiac Enzymes: No results for input(s):  CKTOTAL, CKMB, CKMBINDEX, TROPONINI in the last 168 hours. BNP: Invalid input(s): POCBNP CBG: No results for input(s): GLUCAP in the last 168 hours. D-Dimer No results for input(s): DDIMER in the last 72 hours. Hgb A1c No results for input(s): HGBA1C in the last 72 hours. Lipid Profile No results for input(s): CHOL, HDL, LDLCALC, TRIG, CHOLHDL, LDLDIRECT in the last 72 hours. Thyroid function studies No results for input(s): TSH, T4TOTAL, T3FREE, THYROIDAB in the last 72 hours.  Invalid input(s): FREET3 Anemia work up No results for input(s): VITAMINB12, FOLATE, FERRITIN, TIBC, IRON, RETICCTPCT in the last 72 hours. Urinalysis    Component Value Date/Time   COLORURINE YELLOW 05/08/2017 1809   APPEARANCEUR CLEAR 05/08/2017 1809   LABSPEC 1.009 05/08/2017 1809   PHURINE 8.0 05/08/2017 1809  GLUCOSEU NEGATIVE 05/08/2017 1809   HGBUR NEGATIVE 05/08/2017 1809   BILIRUBINUR NEGATIVE 05/08/2017 1809   KETONESUR NEGATIVE 05/08/2017 1809   PROTEINUR NEGATIVE 05/08/2017 1809   NITRITE NEGATIVE 05/08/2017 1809   LEUKOCYTESUR NEGATIVE 05/08/2017 1809   Sepsis Labs Invalid input(s): PROCALCITONIN,  WBC,  LACTICIDVEN Microbiology Recent Results (from the past 240 hour(s))  Respiratory Panel by PCR     Status: None   Collection Time: 05/08/17  9:20 AM  Result Value Ref Range Status   Adenovirus NOT DETECTED NOT DETECTED Final   Coronavirus 229E NOT DETECTED NOT DETECTED Final   Coronavirus HKU1 NOT DETECTED NOT DETECTED Final   Coronavirus NL63 NOT DETECTED NOT DETECTED Final   Coronavirus OC43 NOT DETECTED NOT DETECTED Final   Metapneumovirus NOT DETECTED NOT DETECTED Final   Rhinovirus / Enterovirus NOT DETECTED NOT DETECTED Final   Influenza A NOT DETECTED NOT DETECTED Final   Influenza B NOT DETECTED NOT DETECTED Final   Parainfluenza Virus 1 NOT DETECTED NOT DETECTED Final   Parainfluenza Virus 2 NOT DETECTED NOT DETECTED Final   Parainfluenza Virus 3 NOT DETECTED NOT  DETECTED Final   Parainfluenza Virus 4 NOT DETECTED NOT DETECTED Final   Respiratory Syncytial Virus NOT DETECTED NOT DETECTED Final   Bordetella pertussis NOT DETECTED NOT DETECTED Final   Chlamydophila pneumoniae NOT DETECTED NOT DETECTED Final   Mycoplasma pneumoniae NOT DETECTED NOT DETECTED Final    Comment: Performed at [facility], [address], [place], [place] [address]  Blood culture (routine x 2)     Status: None   Collection Time: 05/08/17 10:21 AM  Result Value Ref Range Status   Specimen Description BLOOD RIGHT ARM  Final   Special Requests   Final    BOTTLES DRAWN AEROBIC AND ANAEROBIC Blood Culture adequate volume   Culture   Final    NO GROWTH 5 DAYS Performed at [facility], [address], [place], [place] [address]    Report Status 05/13/2017 FINAL  Final  Blood culture (routine x 2)     Status: None   Collection Time: 05/08/17 10:22 AM  Result Value Ref Range Status   Specimen Description BLOOD RIGHT ARM  Final   Special Requests   Final    BOTTLES DRAWN AEROBIC AND ANAEROBIC Blood Culture adequate volume   Culture   Final    NO GROWTH 5 DAYS Performed at [facility], [address] [place], [place] [address]    Report Status 05/13/2017 FINAL  Final     Time coordinating discharge: 40 min  SIGNED:   Calvert Cantor, MD  [facility] 05/17/2017, 5:30 PM Pager   If 7PM-7AM, please contact night-coverage [email or web address] Password [email or web address]

## 2017-06-11 ENCOUNTER — Emergency Department (HOSPITAL_COMMUNITY)
Admission: EM | Admit: 2017-06-11 | Discharge: 2017-06-12 | Payer: 59 | Attending: Emergency Medicine | Admitting: Emergency Medicine

## 2017-06-11 ENCOUNTER — Encounter (HOSPITAL_COMMUNITY): Payer: Self-pay | Admitting: Emergency Medicine

## 2017-06-11 DIAGNOSIS — F312 Bipolar disorder, current episode manic severe with psychotic features: Secondary | ICD-10-CM | POA: Diagnosis not present

## 2017-06-11 DIAGNOSIS — F319 Bipolar disorder, unspecified: Secondary | ICD-10-CM | POA: Diagnosis present

## 2017-06-11 DIAGNOSIS — Z79899 Other long term (current) drug therapy: Secondary | ICD-10-CM | POA: Diagnosis not present

## 2017-06-11 DIAGNOSIS — F419 Anxiety disorder, unspecified: Secondary | ICD-10-CM | POA: Diagnosis not present

## 2017-06-11 DIAGNOSIS — F3113 Bipolar disorder, current episode manic without psychotic features, severe: Secondary | ICD-10-CM | POA: Diagnosis present

## 2017-06-11 DIAGNOSIS — R454 Irritability and anger: Secondary | ICD-10-CM | POA: Diagnosis not present

## 2017-06-11 DIAGNOSIS — R450 Nervousness: Secondary | ICD-10-CM | POA: Diagnosis not present

## 2017-06-11 LAB — COMPREHENSIVE METABOLIC PANEL
ALBUMIN: 4.3 g/dL (ref 3.5–5.0)
ALT: 31 U/L (ref 17–63)
AST: 43 U/L — AB (ref 15–41)
Alkaline Phosphatase: 83 U/L (ref 38–126)
Anion gap: 19 — ABNORMAL HIGH (ref 5–15)
BILIRUBIN TOTAL: 1.9 mg/dL — AB (ref 0.3–1.2)
BUN: 9 mg/dL (ref 6–20)
CO2: 19 mmol/L — ABNORMAL LOW (ref 22–32)
Calcium: 9.6 mg/dL (ref 8.9–10.3)
Chloride: 108 mmol/L (ref 101–111)
Creatinine, Ser: 1.38 mg/dL — ABNORMAL HIGH (ref 0.61–1.24)
GFR calc Af Amer: >60 mL/min (ref >60)
GLUCOSE: 96 mg/dL (ref 65–99)
POTASSIUM: 4 mmol/L (ref 3.5–5.1)
Sodium: 146 mmol/L — ABNORMAL HIGH (ref 135–145)
TOTAL PROTEIN: 8.1 g/dL (ref 6.5–8.1)

## 2017-06-11 LAB — RAPID HIV SCREEN (HIV 1/2 AB+AG)
HIV 1/2 ANTIBODIES: NONREACTIVE
HIV-1 P24 Antigen - HIV24: NONREACTIVE

## 2017-06-11 LAB — CBC
HEMATOCRIT: 38.5 % — AB (ref 39.0–52.0)
Hemoglobin: 12.8 g/dL — ABNORMAL LOW (ref 13.0–17.0)
MCH: 26.7 pg (ref 26.0–34.0)
MCHC: 33.2 g/dL (ref 30.0–36.0)
MCV: 80.4 fL (ref 78.0–100.0)
Platelets: 191 10*3/uL (ref 150–400)
RBC: 4.79 MIL/uL (ref 4.22–5.81)
RDW: 15.7 % — AB (ref 11.5–15.5)
WBC: 15.9 10*3/uL — ABNORMAL HIGH (ref 4.0–10.5)

## 2017-06-11 LAB — ETHANOL

## 2017-06-11 LAB — LITHIUM LEVEL: Lithium Lvl: 0.31 mmol/L — ABNORMAL LOW (ref 0.60–1.20)

## 2017-06-11 MED ORDER — LAMOTRIGINE 25 MG PO TABS
25.0000 mg | ORAL_TABLET | Freq: Every evening | ORAL | Status: DC
  Administered 2017-06-11: 25 mg via ORAL
  Filled 2017-06-11: qty 1

## 2017-06-11 MED ORDER — MIRTAZAPINE 7.5 MG PO TABS
7.5000 mg | ORAL_TABLET | Freq: Every evening | ORAL | Status: DC
  Administered 2017-06-11: 7.5 mg via ORAL
  Filled 2017-06-11: qty 1

## 2017-06-11 MED ORDER — HYDROXYZINE HCL 50 MG/ML IM SOLN
25.0000 mg | Freq: Once | INTRAMUSCULAR | Status: AC
  Administered 2017-06-11: 25 mg via INTRAMUSCULAR
  Filled 2017-06-11: qty 1

## 2017-06-11 MED ORDER — LORAZEPAM 2 MG/ML IJ SOLN
1.0000 mg | Freq: Once | INTRAMUSCULAR | Status: AC
  Administered 2017-06-11: 1 mg via INTRAVENOUS
  Filled 2017-06-11: qty 1

## 2017-06-11 MED ORDER — LORAZEPAM 2 MG/ML IJ SOLN
2.0000 mg | Freq: Once | INTRAMUSCULAR | Status: AC
  Administered 2017-06-11: 2 mg via INTRAMUSCULAR
  Filled 2017-06-11: qty 1

## 2017-06-11 MED ORDER — SODIUM CHLORIDE 0.9 % IV BOLUS
1000.0000 mL | Freq: Once | INTRAVENOUS | Status: AC
  Administered 2017-06-11: 1000 mL via INTRAVENOUS

## 2017-06-11 MED ORDER — LITHIUM CARBONATE 300 MG PO CAPS
300.0000 mg | ORAL_CAPSULE | Freq: Three times a day (TID) | ORAL | Status: DC
  Administered 2017-06-11 – 2017-06-12 (×3): 300 mg via ORAL
  Filled 2017-06-11 (×3): qty 1

## 2017-06-11 NOTE — Progress Notes (Signed)
06/11/17  1604  Patient is receiving IV fluids. Tried giving patient water and PO Lithium. Patient continues to move head side to side refusing the water and Lithium.

## 2017-06-11 NOTE — ED Triage Notes (Signed)
Patient brought in from [facility] aggressive. Medical Clearance. Agitation. Hx of same, bipolar disorder. Cuffed to chair.  According to chart, patient has not had lithium in 14 days.

## 2017-06-11 NOTE — ED Notes (Signed)
Bed: WTR6 Expected date:  Expected time:  Means of arrival:  Comments: 

## 2017-06-11 NOTE — BH Assessment (Signed)
Tele Assessment Note   Patient Name: Brandon Fuentes MRN: [id number] Referring Physician: Cathren Laine, MD Location of Patient: [facility] Location of Provider: Behavioral Health TTS Department  Brandon Fuentes is an 27 y.o. male with history of bipolar and psychosis present to [facility] by [facility] for bizarre behavior and refusing to take his medication for the past several days. Patient present with acute psychosis and catatonia state. Patient refused to speak only mumbling, babbling incoherently and singing. Patient agitated and anxious.   Unable to assess for suicide / homicidal ideations, auditory / visual hallucination.      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 Bipolar 1 disorder (HCC)     History reviewed. No pertinent surgical history.  Family History:  Family History  Problem Relation Age of Onset  . Diabetes Mother     Social History:  reports that he has never smoked. He has never used smokeless tobacco. He reports that he does not drink alcohol or use drugs.  Additional Social History:  Alcohol / Drug Use Pain Medications: see MAR Prescriptions: see MAR Over the Counter: see MAR History of alcohol / drug use?: No history of alcohol / drug abuse Longest period of sobriety (when/how long): NA  CIWA: CIWA-Ar BP: 125/77 Pulse Rate: 90 COWS:    Allergies:  Allergies  Allergen Reactions  . Atomoxetine Other (See Comments)    Increased agitation  . Depakote [Divalproex Sodium] Other (See Comments)    Sleepiness, anger  . Geodon [Ziprasidone Hcl] Other (See Comments)    Per mom pt get very agitated  . 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 Risperidone And Related     Per mother caused Neuroleptic malignant syndrome  . Zoloft [Sertraline Hcl]     anger  . Olanzapine Other (See Comments)    NMS - neuroleptic malignant syndrome    Home  Medications:  (Not in a hospital admission)  OB/GYN Status:  No LMP for male patient.  General Assessment Data Assessment unable to be completed: Yes Reason for not completing assessment: Pt refusing to speak, trangential lanuage and mumbling Location of Assessment: [facility] TTS Assessment: In system Is this a Tele or Face-to-Face Assessment?: Face-to-Face Is this an Initial Assessment or a Re-assessment for this encounter?: Initial Assessment Marital status: Single Living Arrangements: Other (Comment)(Pt is currently in jail ) Can pt return to current living arrangement?: Yes Admission Status: Involuntary(pt brought to ED from jail by [facility] de[facility] ) Is patient capable of signing voluntary admission?: No Referral Source: Self/Family/Friend Insurance type: [employer]     Crisis Care Plan Living Arrangements: Other (Comment)(Pt is currently in jail ) Name of Psychiatrist: Industrial/product designer) Name of Therapist: UTA     Risk to self with the past 6 months Suicidal Ideation: (UTA) Has patient been a risk to self within the past 6 months prior to admission? : Other (comment)(UTA) Suicidal Intent: (UTA) Has patient had any suicidal intent within the past 6 months prior to admission? : (UTA) Is patient at risk for suicide?: (UTA) Suicidal Plan?: (UTA) Has patient had any suicidal plan within the past 6 months prior to admission? : Other (comment)(UTA) Access to Means: (UTA) What has been your use of drugs/alcohol within the last 12 months?: (UTA) Previous Attempts/Gestures: (UTA) How many times?: (UTA) Other Self Harm Risks: UTA Triggers for Past Attempts: (UTA) Intentional Self Injurious Behavior: (UTA) Family Suicide History:  Unable to assess Recent stressful life event(s): (UTA) Persecutory voices/beliefs?: No Depression: (UTA) Depression Symptoms: (UTA) Substance abuse history and/or treatment for substance abuse?: (UTA) Suicide prevention information given to non-admitted  patients: Not applicable  Risk to Others within the past 6 months Homicidal Ideation: (UTA) Does patient have any lifetime risk of violence toward others beyond the six months prior to admission? : Unknown Thoughts of Harm to Others: (UTA) Current Homicidal Intent: (UTA) Current Homicidal Plan: (UTA) Access to Homicidal Means: (UTA) Identified Victim: UTA History of harm to others?: (UTA) Assessment of Violence: (UTA) Violent Behavior Description: UTA Does patient have access to weapons?: (UTA) Criminal Charges Pending?: (UTA) Does patient have a court date: (UTA) Is patient on probation?: (UTA)  Psychosis Hallucinations: (UTA) Delusions: (UTA)  Mental Status Report Appearance/Hygiene: In scrubs Eye Contact: Poor Motor Activity: Freedom of movement Speech: Tangential, Other (Comment)(mumbling ) Level of Consciousness: Unable to assess Mood: (Psychotic, very aggressive) Affect: Unable to Assess Anxiety Level: (UTA) Thought Processes: Unable to Assess Judgement: Unable to Assess Orientation: Unable to assess Obsessive Compulsive Thoughts/Behaviors: Unable to Assess  Cognitive Functioning Concentration: Unable to Assess Memory: Unable to Assess Is patient IDD: (UTA) Is patient DD?: (UTA) Insight: Unable to Assess Impulse Control: Unable to Assess Appetite: (UTA) Have you had any weight changes? : (UTA) Sleep: Unable to Assess Total Hours of Sleep: (UTA) Vegetative Symptoms: Unable to Assess  ADLScreening [facility] Assessment Services) Patient's cognitive ability adequate to safely complete daily activities?: Yes Patient able to express need for assistance with ADLs?: No Independently performs ADLs?: Yes (appropriate for developmental age)  Prior Inpatient Therapy Prior Inpatient Therapy: (UTA)  Prior Outpatient Therapy Prior Outpatient Therapy: (UTA)  ADL Screening (condition at time of admission) Patient's cognitive ability adequate to safely complete daily  activities?: Yes Is the patient deaf or have difficulty hearing?: No Does the patient have difficulty seeing, even when wearing glasses/contacts?: No Does the patient have difficulty concentrating, remembering, or making decisions?: No Patient able to express need for assistance with ADLs?: No Does the patient have difficulty dressing or bathing?: No Independently performs ADLs?: Yes (appropriate for developmental age) Does the patient have difficulty walking or climbing stairs?: No       Abuse/Neglect Assessment (Assessment to be complete while patient is alone) Abuse/Neglect Assessment Can Be Completed: Unable to assess, patient is non-responsive or altered mental status     Advance Directives (For Healthcare) Does Patient Have a Medical Advance Directive?: No Would patient like information on creating a medical advance directive?: No - Patient declined          Disposition:  Disposition Initial Assessment Completed for this Encounter: Yes([place], NP, recommend inpt tx)   Matilyn Fehrman 06/11/2017 10:54 PM

## 2017-06-11 NOTE — BHH Counselor (Signed)
Patient transitioned to [facility] under [place] supervision per Toniann Fail, Charity fundraiser, Chiropodist. Patient will remain in the custody of [facility] office. Patient will be IVC'd. Patient will be under the watch/supervision of the Sheriff's office along with staff.

## 2017-06-11 NOTE — ED Provider Notes (Addendum)
[facility]-EMERGENCY DEPT Provider Note   CSN: [id number] Arrival date & time: 06/11/17  1025     History   Chief Complaint Chief Complaint  Patient presents with  . Medical Clearance  . Altered Mental Status  . Aggressive Behavior    HPI Brandon Fuentes is a 27 y.o. male.  Patient with hx bipolar disorder, presents from jail with bizarre behavior, and refusing his meds for several days. Patient with acute psychosis, babbling incoherently from one unrelated thought to next, agitated, anxious - level 5 caveat re: psychosis.   The history is provided by the patient and the police. The history is limited by the condition of the patient.  Altered Mental Status      Past Medical History:  Diagnosis Date  . Bipolar 1 disorder [facility])     Patient Active Problem List   Diagnosis Date Noted  . Rhabdomyolysis 05/15/2017  . Pneumomediastinum (HCC)   . Pneumopericardium   . Elevated LFTs   . Bipolar disorder, curr episode mixed, severe, w/o psychotic features (HCC) 04/10/2016  . Bipolar I disorder, current or most recent episode manic, severe (HCC) 04/01/2016  . 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 Psychosis (HCC) 04/01/2016    History reviewed. No pertinent surgical history.      Home Medications    Prior to Admission medications   Medication Sig Start Date End Date Taking? Authorizing Provider  lamoTRIgine (LAMICTAL) 25 MG tablet Take 25 mg by mouth every evening.   Yes [provider]  lithium carbonate 300 MG capsule Take 300 mg by mouth 3 (three) times daily. 06/10/14  Yes [provider]  mirtazapine (REMERON) 15 MG tablet Take 7.5 mg by mouth every evening.   Yes [provider]    Family History Family History  Problem Relation Age of Onset  . Diabetes Mother     Social History Social History   Tobacco Use  . Smoking status: Never Smoker  . Smokeless tobacco: Never Used  Substance Use Topics  . Alcohol use: No    . Drug use: No     Allergies   Atomoxetine; Depakote [divalproex sodium]; Geodon [ziprasidone hcl]; Haldol [haloperidol]; Risperidone and related; Zoloft [sertraline hcl]; and Olanzapine   Review of Systems Review of Systems  Unable to perform ROS: Psychiatric disorder  level 5 caveat - acute psychosis   Physical Exam Updated Vital Signs There were no vitals taken for this visit.  Physical Exam  Constitutional: He appears well-developed and well-nourished. No distress.  HENT:  Head: Atraumatic.  Eyes: Pupils are equal, round, and reactive to light. Conjunctivae are normal.  Neck: Neck supple. No tracheal deviation present.  Cardiovascular: Normal rate, regular rhythm, normal heart sounds and intact distal pulses.  Pulmonary/Chest: Effort normal and breath sounds normal. No accessory muscle usage. No respiratory distress.  Abdominal: He exhibits no distension. There is no tenderness.  Musculoskeletal: He exhibits no edema.  Neurological: He is alert.  Awake and alert. Hyperverbal, randomly moving from one unrelated thought to next. Steady gait. Normal tone, no rigidity, no tremor or shakes.   Skin: Skin is warm and dry. He is not diaphoretic.  Psychiatric:  Hyperverbal, flight of ideas. Very anxious/mildly agitated.   Nursing note and vitals reviewed.    ED Treatments / Results  Labs (all labs ordered are listed, but only abnormal results are displayed) Results for orders placed or performed during the hospital encounter of 06/11/17  CBC  Result Value Ref Range   WBC 15.9 (  H) 4.0 - 10.5 K/uL   RBC 4.79 4.22 - 5.81 MIL/uL   Hemoglobin 12.8 (L) 13.0 - 17.0 g/dL   HCT 10.2 (L) 72.5 - 36.6 %   MCV 80.4 78.0 - 100.0 fL   MCH 26.7 26.0 - 34.0 pg   MCHC 33.2 30.0 - 36.0 g/dL   RDW 44.0 (H) 34.7 - 42.5 %   Platelets 191 150 - 400 K/uL  Comprehensive metabolic panel  Result Value Ref Range   Sodium 146 (H) 135 - 145 mmol/L   Potassium 4.0 3.5 - 5.1 mmol/L   Chloride  108 101 - 111 mmol/L   CO2 19 (L) 22 - 32 mmol/L   Glucose, Bld 96 65 - 99 mg/dL   BUN 9 6 - 20 mg/dL   Creatinine, Ser 9.56 (H) 0.61 - 1.24 mg/dL   Calcium 9.6 8.9 - 38.7 mg/dL   Total Protein 8.1 6.5 - 8.1 g/dL   Albumin 4.3 3.5 - 5.0 g/dL   AST 43 (H) 15 - 41 U/L   ALT 31 17 - 63 U/L   Alkaline Phosphatase 83 38 - 126 U/L   Total Bilirubin 1.9 (H) 0.3 - 1.2 mg/dL   GFR calc non Af Amer >60 >60 mL/min   GFR calc Af Amer >60 >60 mL/min   Anion gap 19 (H) 5 - 15  Ethanol  Result Value Ref Range   Alcohol, Ethyl (B) <10 <10 mg/dL  Lithium level  Result Value Ref Range   Lithium Lvl 0.31 (L) 0.60 - 1.20 mmol/L   Dg Chest 2 View  Result Date: 05/15/2017 CLINICAL DATA:  Pleuritic chest pain EXAM: CHEST - 2 VIEW COMPARISON:  05/08/2017 FINDINGS: Heart size is normal. The mediastinum is normal. Left chest is clear. There is right lower lobe infiltrate posteriorly consistent with pneumonia. Upper lungs are clear. IMPRESSION: Posterior right lower lobe pneumonia. Electronically Signed   By: Paulina Fusi M.D.   On: 05/15/2017 08:39   Dg Abd 1 View  Result Date: 05/15/2017 CLINICAL DATA:  Upper back pain. EXAM: ABDOMEN - 1 VIEW COMPARISON:  Radiograph of May 06, 2017. FINDINGS: The bowel gas pattern is normal. No radio-opaque calculi or other significant radiographic abnormality are seen. No significant osseous abnormality is noted. IMPRESSION: No evidence of bowel obstruction or ileus. Electronically Signed   By: Lupita Raider, M.D.   On: 05/15/2017 08:40    EKG None  Radiology No results found.  Procedures Procedures (including critical care time)  Medications Ordered in ED Medications  LORazepam (ATIVAN) injection 2 mg (has no administration in time range)  lamoTRIgine (LAMICTAL) tablet 25 mg (has no administration in time range)  lithium carbonate capsule 300 mg (has no administration in time range)  mirtazapine (REMERON) tablet 7.5 mg (has no administration in time range)      Initial Impression / Assessment and Plan / ED Course  I have reviewed the triage vital signs and the nursing notes.  Pertinent labs & imaging results that were available during my care of the patient were reviewed by me and considered in my medical decision making (see chart for details).  Reviewed nursing notes and prior charts for additional history. Pt noted in chart to have hx NMS with anti-psychotic medication therapy.   Ativan im for symptom relief.   BH team consulted.   Additional hx from nurse at jail - pt for past few days has been acting strange, intermittently withdrawn/not verbally responsive, at other times agitated and confrontational, flight  of ideas/word salad, at other times passing in cell and talking to wall, and intermittently refusing meds x 1 week.   Iv ns bolus. ?recent poor po intake.   Po fluids/meal ordered.   Disposition per [facility].   Pt calmer, alert. No distress.   BH team indicates lieutenant at jail has indicated if pt calmer, taking meds, etc, their psychiatrist/team can manage pt at jail.  Will have staff try to feed/pt to take po fluids and meds as ordered - if able to do say, may transition care back to his psychiatry team at jail.   Signed out to Dr Erma Heritage that pt with refusal to take bh meds/fluids, recd ivf in ED - will need to be taking po meds and improvement in acute psychosis in order to be safely d/c to jail.    Final Clinical Impressions(s) / ED Diagnoses   Final diagnoses:  None    ED Discharge Orders    None            Cathren Laine, MD 06/11/17 1557    Cathren Laine, MD 06/11/17 [phone]

## 2017-06-11 NOTE — BHH Counselor (Signed)
TTS writer attempted to speak with patient. Patient continues to mumble and make noises. Refuses to speak. TTS will attempt to assess patient later this day.

## 2017-06-11 NOTE — ED Notes (Signed)
Unable to get vitals on patient

## 2017-06-11 NOTE — ED Notes (Signed)
Social Worker advised she spoke with Dr Theodoro Kos. SW will speak to Psych at the jail and if agreed patient may return when medically cleared without having to eat, drink, walk or take meds as previously written due to patient being defiant while here in custody.

## 2017-06-11 NOTE — BHH Counselor (Signed)
TTS unable to see patient due to patient is asleep and unable to be awaken.   Toniann Fail, RN, Assistance Director is attempting to reach psych with [facility] office. Once Toniann Fail speaks with psych patient can be discharged back to jail.

## 2017-06-11 NOTE — BHH Counselor (Signed)
TTS writer attempted to assess patient. Patient unable to be accessed at this time. In room with two Sheriffs. Patient refusing to speak to TTS. Pt speaking in tangential language and mumbling at this time.

## 2017-06-12 DIAGNOSIS — Z811 Family history of alcohol abuse and dependence: Secondary | ICD-10-CM | POA: Diagnosis not present

## 2017-06-12 DIAGNOSIS — R454 Irritability and anger: Secondary | ICD-10-CM

## 2017-06-12 DIAGNOSIS — R450 Nervousness: Secondary | ICD-10-CM | POA: Diagnosis not present

## 2017-06-12 DIAGNOSIS — R451 Restlessness and agitation: Secondary | ICD-10-CM

## 2017-06-12 DIAGNOSIS — Z9114 Patient's other noncompliance with medication regimen: Secondary | ICD-10-CM

## 2017-06-12 DIAGNOSIS — F419 Anxiety disorder, unspecified: Secondary | ICD-10-CM

## 2017-06-12 DIAGNOSIS — Z653 Problems related to other legal circumstances: Secondary | ICD-10-CM | POA: Diagnosis not present

## 2017-06-12 DIAGNOSIS — F312 Bipolar disorder, current episode manic severe with psychotic features: Secondary | ICD-10-CM

## 2017-06-12 LAB — HEPATITIS C ANTIBODY (REFLEX): HCV Ab: <0.1 s/co ratio (ref 0.0–0.9)

## 2017-06-12 LAB — HCV COMMENT:

## 2017-06-12 LAB — HEPATITIS B SURFACE ANTIGEN: Hepatitis B Surface Ag: NEGATIVE

## 2017-06-12 NOTE — ED Notes (Signed)
Pt unable to sign for discharge instructions due to mental state. Pt discharged with [employer].

## 2017-06-12 NOTE — BHH Suicide Risk Assessment (Signed)
Suicide Risk Assessment  Discharge Assessment   [facility] Discharge Suicide Risk Assessment   Principal Problem: Bipolar I disorder, current or most recent episode manic, severe [facility]) Discharge Diagnoses:  Patient Active Problem List   Diagnosis Date Noted  . Rhabdomyolysis [M62.82] 05/15/2017  . Pneumomediastinum (HCC) [J98.2]   . Pneumopericardium [I31.9]   . Elevated LFTs [R94.5]   . Bipolar disorder, curr episode mixed, severe, w/o psychotic features (HCC) [F31.63] 04/10/2016  . Bipolar I disorder, current or most recent episode manic, severe (HCC) [F31.13] 04/01/2016  . 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 Psychosis (HCC) [F29] 04/01/2016    Total Time spent with patient: 45 minutes  Musculoskeletal: Strength & Muscle Tone: within normal limits Gait & Station: normal Patient leans: N/A  Psychiatric Specialty Exam: Physical Exam  Constitutional: He appears well-developed and well-nourished.  HENT:  Head: Normocephalic.  Respiratory: Effort normal.  Musculoskeletal: Normal range of motion.  Psychiatric: His affect is angry and labile. His speech is tangential. He is agitated, aggressive and combative. Thought content is paranoid and delusional. Cognition and memory are impaired. He expresses impulsivity.   Review of Systems  Psychiatric/Behavioral: Positive for hallucinations. Negative for depression, memory loss, substance abuse and suicidal ideas. The patient is nervous/anxious. The patient does not have insomnia.   All other systems reviewed and are negative.  Blood pressure 125/77, pulse 90, temperature 98.1 F (36.7 C), temperature source Axillary, resp. rate 20, SpO2 100 %.There is no height or weight on file to calculate BMI. General Appearance: Disheveled Eye Contact:  Poor Speech:  Pressured Volume:  Increased Mood:  Angry and Irritable Affect:  Labile Thought Process:  Disorganized Orientation:  Other:  person Thought Content:  Illogical, Delusions,  Hallucinations: Auditory, Paranoid Ideation, Rumination and Tangential Suicidal Thoughts:  UTA hallucinations Homicidal Thoughts:  UTA hallucinations Memory:  Immediate;   UTA hallucinations Recent;   UTA hallucinations Remote;   UTA hallucinations Judgement:  Poor Insight:  Shallow Psychomotor Activity:  Increased Concentration:  Concentration: Poor and Attention Span: Poor Recall:  Poor Fund of Knowledge:  Poor Language:  Fair Akathisia:  No Handed:  Right AIMS (if indicated):    Assets:  Housing ADL's:  Impaired Cognition:  Impaired,  Severe Sleep:   Poor   Mental Status Per Nursing Assessment::   On Admission:   Manic and aggressive  Demographic Factors:  Male, Low socioeconomic status and Unemployed  Loss Factors: Legal issues  Historical Factors: Impulsivity  Risk Reduction Factors:   Sense of responsibility to family  Continued Clinical Symptoms:  Bipolar Disorder:   Mixed State  Cognitive Features That Contribute To Risk:  Loss of executive function    Suicide Risk:  Minimal: No identifiable suicidal ideation.  Patients presenting with no risk factors but with morbid ruminations; may be classified as minimal risk based on the severity of the depressive symptoms    Plan Of Care/Follow-up recommendations:  Activity:  as tolerated Diet:  heart healthy  Laveda Abbe, NP 06/12/2017, 3:35 PM

## 2017-06-12 NOTE — ED Provider Notes (Signed)
Pt here with concern for decompensated bipolar disorder, refusing medications at correctional facility. I discussed case with Dr. Tomasa Rand, prison psychiatrist - pt cannot receive forced meds at facility, and clinically he is concerned for decompensated mania which will require forced meds. Pt given IM meds here - will give ativan/atarax as he has h/o NMS, and encourage to start taking his lithium/mood stabilizers. Will have psych eval to see if will need to be placed/admitted to psych facility prior to returning to prison given his decompensated psychosis.   Shaune Pollack, MD 06/12/17 [phone]

## 2017-06-12 NOTE — Consult Note (Addendum)
[place] Psychiatry Consult   Reason for Consult:  Manic and aggressive behavior Referring Physician:  EDP Patient Identification: Brandon Fuentes MRN:  [id number] Principal Diagnosis: Bipolar I disorder, current or most recent episode manic, severe ([place]) Diagnosis:   Patient Active Problem List   Diagnosis Date Noted  . Rhabdomyolysis [M62.82] 05/15/2017  . Pneumomediastinum ([place]) [J98.2]   . Pneumopericardium [I31.9]   . Elevated LFTs [R94.5]   . Bipolar disorder, curr episode mixed, severe, w/o psychotic features ([place]) [F31.63] 04/10/2016  . Bipolar I disorder, current or most recent episode manic, severe ([place]) [F31.13] 04/01/2016  . 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 Psychosis ([place]) [F29] 04/01/2016    Total Time spent with patient: 45 minutes  Subjective:   Brandon Fuentes is a 27 y.o. male patient admitted from the [facility] jail.  HPI:  Pt was seen and chart reviewed with treatment team and Dr Mariea Clonts. Pt was admitted to the [facility] from the [facility] jail where he had been refusing to take his Lithium and decompensated into a manic state. Pt presented to the ED in hand and ankle shackles with a mesh hood over his face. Pt has been aggressive, yelling and cussing at staff and police who are guarding him. Pt has a history of NMS with antipsychotic medications. Pt took his medication in the ED and today was discharged back into the custody of [facility] jail.   Past Psychiatric History: Bipolar 1 disorder   Risk to Self: Suicidal Ideation: (UTA) Suicidal Intent: (UTA) Is patient at risk for suicide?: (UTA) Suicidal Plan?: (UTA) Access to Means: (UTA) What has been your use of drugs/alcohol within the last 12 months?: (UTA) How many times?: (UTA) Other Self Harm Risks: UTA Triggers for Past Attempts: (UTA) Intentional Self Injurious Behavior: (UTA) Risk to Others: Homicidal Ideation: (UTA) Thoughts of Harm to Others: (UTA) Current Homicidal Intent:  (UTA) Current Homicidal Plan: (UTA) Access to Homicidal Means: (UTA) Identified Victim: UTA History of harm to others?: (UTA) Assessment of Violence: (UTA) Violent Behavior Description: UTA Does patient have access to weapons?: (UTA) Criminal Charges Pending?: (UTA) Does patient have a court date: (UTA) Prior Inpatient Therapy: Prior Inpatient Therapy: (UTA) Prior Outpatient Therapy: Prior Outpatient Therapy: (UTA)  Past Medical History:  Past Medical History:  Diagnosis Date  . Bipolar 1 disorder ([place])    History reviewed. No pertinent surgical history. Family History:  Family History  Problem Relation Age of Onset  . Diabetes Mother    Family Psychiatric  History: Extensive history of alcoholism on maternal and paternal side.   Social History:  Social History   Substance and Sexual Activity  Alcohol Use No     Social History   Substance and Sexual Activity  Drug Use No    Social History   Socioeconomic History  . Marital status: Single    Spouse name: Not on file  . Number of children: Not on file  . Years of education: Not on file  . Highest education level: Not on file  Occupational History  . Not on file  Social Needs  . Financial resource strain: Patient refused  . Food insecurity:    Worry: Patient refused    Inability: Patient refused  . Transportation needs:    Medical: Patient refused    Non-medical: Patient refused  Tobacco Use  . Smoking status: Never Smoker  . Smokeless tobacco: Never Used  Substance and Sexual Activity  . Alcohol use: No  . Drug use: No  .  Sexual activity: Yes    Birth control/protection: None  Lifestyle  . Physical activity:    Days per week: Not on file    Minutes per session: Not on file  . Stress: Not on file  Relationships  . Social connections:    Talks on phone: Not on file    Gets together: Not on file    Attends religious service: Not on file    Active member of club or organization: Not on file     Attends meetings of clubs or organizations: Not on file    Relationship status: Not on file  Other Topics Concern  . Not on file  Social History Narrative  . Not on file   Additional Social History: N/A    Allergies:   Allergies  Allergen Reactions  . Atomoxetine Other (See Comments)    Increased agitation  . Depakote [Divalproex Sodium] Other (See Comments)    Sleepiness, anger  . Geodon [Ziprasidone Hcl] Other (See Comments)    Per mom pt get very agitated  . 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 Risperidone And Related     Per mother caused Neuroleptic malignant syndrome  . Zoloft [Sertraline Hcl]     anger  . Olanzapine Other (See Comments)    NMS - neuroleptic malignant syndrome    Labs:  Results for orders placed or performed during the hospital encounter of 06/11/17 (from the past 48 hour(s))  CBC     Status: Abnormal   Collection Time: 06/11/17 11:34 AM  Result Value Ref Range   WBC 15.9 (H) 4.0 - 10.5 K/uL   RBC 4.79 4.22 - 5.81 MIL/uL   Hemoglobin 12.8 (L) 13.0 - 17.0 g/dL   HCT 38.5 (L) 39.0 - 52.0 %   MCV 80.4 78.0 - 100.0 fL   MCH 26.7 26.0 - 34.0 pg   MCHC 33.2 30.0 - 36.0 g/dL   RDW 15.7 (H) 11.5 - 15.5 %   Platelets 191 150 - 400 K/uL    Comment: Performed at [facility], [place] [address], [place], [place] [address]  Comprehensive metabolic panel     Status: Abnormal   Collection Time: 06/11/17 11:34 AM  Result Value Ref Range   Sodium 146 (H) 135 - 145 mmol/L   Potassium 4.0 3.5 - 5.1 mmol/L   Chloride 108 101 - 111 mmol/L   CO2 19 (L) 22 - 32 mmol/L   Glucose, Bld 96 65 - 99 mg/dL   BUN 9 6 - 20 mg/dL   Creatinine, Ser 1.38 (H) 0.61 - 1.24 mg/dL   Calcium 9.6 8.9 - 10.3 mg/dL   Total Protein 8.1 6.5 - 8.1 g/dL   Albumin 4.3 3.5 - 5.0 g/dL   AST 43 (H) 15 - 41 U/L   ALT 31 17 - 63 U/L   Alkaline Phosphatase 83 38 - 126 U/L   Total Bilirubin 1.9 (H) 0.3 - 1.2 mg/dL   GFR calc non  Af Amer >60 >60 mL/min   GFR calc Af Amer >60 >60 mL/min    Comment: (NOTE) The eGFR has been calculated using the CKD EPI equation. This calculation has not been validated in all clinical situations. eGFR's persistently <60 mL/min signify possible Chronic Kidney Disease.    Anion gap 19 (H) 5 - 15    Comment: Performed at [facility], [place] [address], [place], [place] [address]  Ethanol     Status: None  Collection Time: 06/11/17 11:34 AM  Result Value Ref Range   Alcohol, Ethyl (B) <10 <10 mg/dL    Comment: (NOTE) Lowest detectable limit for serum alcohol is 10 mg/dL. For medical purposes only. Performed at [facility], [place] [address] [place], [place] [address]   Lithium level     Status: Abnormal   Collection Time: 06/11/17 11:34 AM  Result Value Ref Range   Lithium Lvl 0.31 (L) 0.60 - 1.20 mmol/L    Comment: Performed at [facility], [place] [address] [place], [place] [address]  Rapid HIV screen (HIV 1/2 Ab+Ag)     Status: None   Collection Time: 06/11/17  3:20 PM  Result Value Ref Range   HIV-1 P24 Antigen - HIV24 NON REACTIVE NON REACTIVE   HIV 1/2 Antibodies NON REACTIVE NON REACTIVE   Interpretation (HIV Ag Ab)      A non reactive test result means that HIV 1 or HIV 2 antibodies and HIV 1 p24 antigen were not detected in the specimen.    Comment: RESULT CALLED TO, READ BACK BY AND VERIFIED WITH: CARPENTER,J. RN @1641  ON 05.14.19 BY COHEN,K Performed at [facility], [place] [address] [place], [place] [address]   Hepatitis B surface antigen     Status: None   Collection Time: 06/11/17  3:20 PM  Result Value Ref Range   Hepatitis B Surface Ag Negative Negative    Comment: (NOTE) Performed At: [facility] [place], [place] [id number] Rush Farmer MD BJ:[id number] Performed at [facility], [place] [address], [place], [place] [address]   Hepatitis c  antibody (reflex)     Status: None   Collection Time: 06/11/17  3:20 PM  Result Value Ref Range   HCV Ab <0.1 0.0 - 0.9 s/co ratio    Comment: (NOTE) Performed At: [facility] [place], [place] [id number] Rush Farmer MD [id number] Performed at [facility], [place] [address] [place], [place] [address]   HCV Comment:     Status: None   Collection Time: 06/11/17  3:20 PM  Result Value Ref Range   Comment: Comment     Comment: (NOTE) Non reactive HCV antibody screen is consistent with no HCV infection, unless recent infection is suspected or other evidence exists to indicate HCV infection. Performed At: [facility] [place], [place] [id number] Rush Farmer MD FY:[id number] Performed at [facility], [place] [address] [place],  [address]     No current facility-administered medications for this encounter.    Current Outpatient Medications  Medication Sig Dispense Refill  . lamoTRIgine (LAMICTAL) 25 MG tablet Take 25 mg by mouth every evening.    . lithium carbonate 300 MG capsule Take 300 mg by mouth 3 (three) times daily.    . mirtazapine (REMERON) 15 MG tablet Take 7.5 mg by mouth every evening.      Musculoskeletal: Strength & Muscle Tone: within normal limits Gait & Station: normal Patient leans: N/A  Psychiatric Specialty Exam: Physical Exam  Nursing note and vitals reviewed. Constitutional: He appears well-developed and well-nourished.  HENT:  Head: Normocephalic.  Neck: Normal range of motion.  Respiratory: Effort normal.  Musculoskeletal: Normal range of motion.  Neurological: He is alert.  Oriented to self.  Psychiatric: His affect is angry and labile. His speech is tangential. He is agitated, aggressive and combative. Thought content is paranoid and delusional. Cognition and memory are impaired. He  expresses impulsivity.    Review of Systems   Psychiatric/Behavioral: Positive for hallucinations. Negative for depression, memory loss, substance abuse and suicidal ideas. The patient is nervous/anxious. The patient does not have insomnia.   All other systems reviewed and are negative.   Blood pressure 125/77, pulse 90, temperature 98.1 F (36.7 C), temperature source Axillary, resp. rate 20, SpO2 100 %.There is no height or weight on file to calculate BMI.  General Appearance: Disheveled  Eye Contact:  Poor  Speech:  Pressured  Volume:  Increased  Mood:  Angry and Irritable  Affect:  Labile  Thought Process:  Disorganized  Orientation:  Other:  person  Thought Content:  Illogical, Delusions, Hallucinations: Auditory, Paranoid Ideation, Rumination and Tangential  Suicidal Thoughts:  UTA hallucinations  Homicidal Thoughts:  UTA hallucinations  Memory:  Immediate;   UTA hallucinations Recent;   UTA hallucinations Remote;   UTA hallucinations  Judgement:  Poor  Insight:  Shallow  Psychomotor Activity:  Increased  Concentration:  Concentration: Poor and Attention Span: Poor  Recall:  Poor  Fund of Knowledge:  Poor  Language:  Fair  Akathisia:  No  Handed:  Right  AIMS (if indicated):   N/A  Assets:  Housing  ADL's:  Impaired  Cognition:  Impaired,  Severe  Sleep:   Poor     Treatment Plan Summary: Plan Bipolar I disorder, current or most recent episode manic, severe ([place])  Continue psychotropic medications that were restarted in the ED.  Discharge to the custody of [facility] jail  Discharge paper work given to deputies transporting him.   Disposition: Patient does not meet criteria for psychiatric inpatient admission. Pt is in the custody of [facility] jail.   Ethelene Hal, NP 06/12/2017 3:25 PM   Patient seen face-to-face for psychiatric evaluation, chart reviewed and case discussed with the physician extender and developed treatment plan. Reviewed the information documented and agree with the  treatment plan.  Buford Dresser, DO 06/12/17 6:21 PM

## 2017-06-17 ENCOUNTER — Observation Stay (HOSPITAL_COMMUNITY): Payer: 59

## 2017-06-17 ENCOUNTER — Observation Stay (HOSPITAL_COMMUNITY)
Admission: EM | Admit: 2017-06-17 | Discharge: 2017-06-19 | Disposition: A | Discharge status: Home / Self Care | Payer: 59 | Attending: Internal Medicine | Admitting: Internal Medicine

## 2017-06-17 ENCOUNTER — Encounter (HOSPITAL_COMMUNITY): Payer: Self-pay | Admitting: *Deleted

## 2017-06-17 DIAGNOSIS — Z9114 Patient's other noncompliance with medication regimen: Secondary | ICD-10-CM

## 2017-06-17 DIAGNOSIS — E872 Acidosis: Secondary | ICD-10-CM | POA: Diagnosis not present

## 2017-06-17 DIAGNOSIS — F29 Unspecified psychosis not due to a substance or known physiological condition: Secondary | ICD-10-CM | POA: Diagnosis present

## 2017-06-17 DIAGNOSIS — R945 Abnormal results of liver function studies: Secondary | ICD-10-CM | POA: Insufficient documentation

## 2017-06-17 DIAGNOSIS — F3010 Manic episode without psychotic symptoms, unspecified: Secondary | ICD-10-CM

## 2017-06-17 DIAGNOSIS — Z79899 Other long term (current) drug therapy: Secondary | ICD-10-CM | POA: Diagnosis not present

## 2017-06-17 DIAGNOSIS — N179 Acute kidney failure, unspecified: Secondary | ICD-10-CM

## 2017-06-17 DIAGNOSIS — E860 Dehydration: Secondary | ICD-10-CM | POA: Diagnosis not present

## 2017-06-17 DIAGNOSIS — D72829 Elevated white blood cell count, unspecified: Secondary | ICD-10-CM

## 2017-06-17 DIAGNOSIS — E870 Hyperosmolality and hypernatremia: Secondary | ICD-10-CM | POA: Diagnosis not present

## 2017-06-17 DIAGNOSIS — F3163 Bipolar disorder, current episode mixed, severe, without psychotic features: Principal | ICD-10-CM | POA: Diagnosis present

## 2017-06-17 DIAGNOSIS — E8729 Other acidosis: Secondary | ICD-10-CM

## 2017-06-17 DIAGNOSIS — R7989 Other specified abnormal findings of blood chemistry: Secondary | ICD-10-CM | POA: Diagnosis present

## 2017-06-17 LAB — URINALYSIS, ROUTINE W REFLEX MICROSCOPIC
Bacteria, UA: NONE SEEN
GLUCOSE, UA: NEGATIVE mg/dL
Glucose, UA: NEGATIVE mg/dL
HGB URINE DIPSTICK: NEGATIVE
HGB URINE DIPSTICK: NEGATIVE
KETONES UR: 80 mg/dL — AB
Ketones, ur: 80 mg/dL — AB
LEUKOCYTES UA: NEGATIVE
Leukocytes, UA: NEGATIVE
NITRITE: NEGATIVE
Nitrite: NEGATIVE
PROTEIN: 100 mg/dL — AB
Protein, ur: 100 mg/dL — AB
SPECIFIC GRAVITY, URINE: 1.034 — AB (ref 1.005–1.030)
Specific Gravity, Urine: 1.034 — ABNORMAL HIGH (ref 1.005–1.030)
pH: 5 (ref 5.0–8.0)
pH: 5 (ref 5.0–8.0)

## 2017-06-17 LAB — CBC WITH DIFFERENTIAL/PLATELET
Basophils Absolute: 0.1 10*3/uL (ref 0.0–0.1)
Basophils Relative: 0 %
EOS ABS: 0.2 10*3/uL (ref 0.0–0.7)
Eosinophils Relative: 2 %
HEMATOCRIT: 37.2 % — AB (ref 39.0–52.0)
Hemoglobin: 12.2 g/dL — ABNORMAL LOW (ref 13.0–17.0)
LYMPHS ABS: 3.4 10*3/uL (ref 0.7–4.0)
Lymphocytes Relative: 28 %
MCH: 26.8 pg (ref 26.0–34.0)
MCHC: 32.8 g/dL (ref 30.0–36.0)
MCV: 81.6 fL (ref 78.0–100.0)
MONOS PCT: 9 %
Monocytes Absolute: 1 10*3/uL (ref 0.1–1.0)
NEUTROS PCT: 61 %
Neutro Abs: 7.3 10*3/uL (ref 1.7–7.7)
Platelets: 226 10*3/uL (ref 150–400)
RBC: 4.56 MIL/uL (ref 4.22–5.81)
RDW: 16.7 % — ABNORMAL HIGH (ref 11.5–15.5)
WBC: 12.1 10*3/uL — AB (ref 4.0–10.5)

## 2017-06-17 LAB — COMPREHENSIVE METABOLIC PANEL
ALBUMIN: 3.9 g/dL (ref 3.5–5.0)
ALT: 30 U/L (ref 17–63)
ANION GAP: 18 — AB (ref 5–15)
AST: 61 U/L — AB (ref 15–41)
Alkaline Phosphatase: 57 U/L (ref 38–126)
BUN: 21 mg/dL — ABNORMAL HIGH (ref 6–20)
CALCIUM: 9.3 mg/dL (ref 8.9–10.3)
CHLORIDE: 121 mmol/L — AB (ref 101–111)
CO2: 21 mmol/L — AB (ref 22–32)
Creatinine, Ser: 1.4 mg/dL — ABNORMAL HIGH (ref 0.61–1.24)
GFR calc non Af Amer: >60 mL/min (ref >60)
GLUCOSE: 80 mg/dL (ref 65–99)
POTASSIUM: 3.8 mmol/L (ref 3.5–5.1)
Sodium: 160 mmol/L — ABNORMAL HIGH (ref 135–145)
Total Bilirubin: 1.8 mg/dL — ABNORMAL HIGH (ref 0.3–1.2)
Total Protein: 7.3 g/dL (ref 6.5–8.1)

## 2017-06-17 LAB — RAPID URINE DRUG SCREEN, HOSP PERFORMED
Amphetamines: NOT DETECTED
Barbiturates: NOT DETECTED
Benzodiazepines: POSITIVE — AB
Cocaine: NOT DETECTED
OPIATES: NOT DETECTED
Tetrahydrocannabinol: NOT DETECTED

## 2017-06-17 LAB — ETHANOL: Alcohol, Ethyl (B): <10 mg/dL (ref <10)

## 2017-06-17 LAB — LITHIUM LEVEL: LITHIUM LVL: 0.31 mmol/L — AB (ref 0.60–1.20)

## 2017-06-17 MED ORDER — LAMOTRIGINE 25 MG PO TABS
25.0000 mg | ORAL_TABLET | Freq: Every evening | ORAL | Status: DC
  Administered 2017-06-17 – 2017-06-18 (×2): 25 mg via ORAL
  Filled 2017-06-17 (×2): qty 1

## 2017-06-17 MED ORDER — LITHIUM CARBONATE 300 MG PO CAPS
300.0000 mg | ORAL_CAPSULE | Freq: Three times a day (TID) | ORAL | Status: DC
  Administered 2017-06-17 – 2017-06-19 (×6): 300 mg via ORAL
  Filled 2017-06-17 (×10): qty 1

## 2017-06-17 MED ORDER — ACETAMINOPHEN 325 MG PO TABS: 650.0000 mg | ORAL_TABLET | Freq: Four times a day (QID) | ORAL | Status: DC | PRN

## 2017-06-17 MED ORDER — LORAZEPAM 2 MG/ML IJ SOLN
1.0000 mg | Freq: Once | INTRAMUSCULAR | Status: AC
  Administered 2017-06-18: 1 mg via INTRAVENOUS
  Filled 2017-06-17: qty 1

## 2017-06-17 MED ORDER — SODIUM CHLORIDE 0.45 % IV SOLN
INTRAVENOUS | Status: DC
  Administered 2017-06-19: 09:00:00 via INTRAVENOUS

## 2017-06-17 MED ORDER — ONDANSETRON HCL 4 MG/2ML IJ SOLN: 4.0000 mg | Freq: Four times a day (QID) | INTRAMUSCULAR | Status: DC | PRN

## 2017-06-17 MED ORDER — LORAZEPAM 2 MG/ML IJ SOLN
2.0000 mg | Freq: Once | INTRAMUSCULAR | Status: DC
  Filled 2017-06-17: qty 1

## 2017-06-17 MED ORDER — SODIUM CHLORIDE 0.9 % IV BOLUS
1000.0000 mL | Freq: Once | INTRAVENOUS | Status: AC
  Administered 2017-06-17: 1000 mL via INTRAVENOUS

## 2017-06-17 MED ORDER — DIPHENHYDRAMINE HCL 50 MG/ML IJ SOLN: 50.0000 mg | Freq: Once | INTRAMUSCULAR | Status: DC

## 2017-06-17 MED ORDER — ENOXAPARIN SODIUM 40 MG/0.4ML ~~LOC~~ SOLN
40.0000 mg | SUBCUTANEOUS | Status: DC
  Administered 2017-06-18: 40 mg via SUBCUTANEOUS
  Filled 2017-06-17 (×2): qty 0.4

## 2017-06-17 MED ORDER — SODIUM CHLORIDE 0.45 % IV SOLN
INTRAVENOUS | Status: DC
  Administered 2017-06-17: 17:00:00 via INTRAVENOUS

## 2017-06-17 MED ORDER — ONDANSETRON HCL 4 MG PO TABS: 4.0000 mg | ORAL_TABLET | Freq: Four times a day (QID) | ORAL | Status: DC | PRN

## 2017-06-17 MED ORDER — ACETAMINOPHEN 650 MG RE SUPP: 650.0000 mg | Freq: Four times a day (QID) | RECTAL | Status: DC | PRN

## 2017-06-17 MED ORDER — MIRTAZAPINE 15 MG PO TABS
7.5000 mg | ORAL_TABLET | Freq: Every evening | ORAL | Status: DC
Start: 2017-06-17 — End: 2017-06-19
  Administered 2017-06-17 – 2017-06-18 (×2): 7.5 mg via ORAL
  Filled 2017-06-17 (×2): qty 1

## 2017-06-17 NOTE — ED Notes (Signed)
Pt does need assistance with feeding. He will talk a lot through out the meal but will eat a little at a time.

## 2017-06-17 NOTE — ED Triage Notes (Addendum)
Per EMS[facility] with aggressive and manic behavior. Per EMS pt denies pain at this time. Pt handcuffed on arrival. Hx of bipolar disorder. Per EMS, nurse from the jail reports that pt has not eaten in approx. 99 hours. EMS administered 5 mg versed prior to arrival.

## 2017-06-17 NOTE — ED Provider Notes (Signed)
[facility]-EMERGENCY DEPT Provider Note   CSN: [id number] Arrival date & time: 06/17/17  1036     History   Chief Complaint Chief Complaint  Patient presents with  . Manic Behavior    HPI Brandon Fuentes is a 27 y.o. male. Level 5 caveat due to altered mental status-psychosis. HPI Patient presents from jail with altered mental status.  History of bipolar disorder with history of noncompliance.  Patient appears somewhat altered and really cannot provide much history.  Has had previous visits to the ER for the same.  He will be noncompliant with his medications.  Reportedly has not been eating for the last 4 days.  May not be taking his medicines also.  Reportedly has not been sleeping either. Past Medical History:  Diagnosis Date  . Bipolar 1 disorder [facility])     Patient Active Problem List   Diagnosis Date Noted  . Rhabdomyolysis 05/15/2017  . Pneumomediastinum (HCC)   . Pneumopericardium   . Elevated LFTs   . Bipolar disorder, curr episode mixed, severe, w/o psychotic features (HCC) 04/10/2016  . Bipolar I disorder, current or most recent episode manic, severe (HCC) 04/01/2016  . 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 Psychosis (HCC) 04/01/2016    No past surgical history on file.      Home Medications    Prior to Admission medications   Medication Sig Start Date End Date Taking? Authorizing Provider  lamoTRIgine (LAMICTAL) 25 MG tablet Take 25 mg by mouth every evening.    [provider]  lithium carbonate 300 MG capsule Take 300 mg by mouth 3 (three) times daily. 06/10/14   [provider]  mirtazapine (REMERON) 15 MG tablet Take 7.5 mg by mouth every evening.    [provider]    Family History Family History  Problem Relation Age of Onset  . Diabetes Mother     Social History Social History   Tobacco Use  . Smoking status: Never Smoker  . Smokeless tobacco: Never Used  Substance Use Topics  . Alcohol use: No    . Drug use: No     Allergies   Atomoxetine; Depakote [divalproex sodium]; Geodon [ziprasidone hcl]; Haldol [haloperidol]; Risperidone and related; Zoloft [sertraline hcl]; and Olanzapine   Review of Systems Review of Systems  Unable to perform ROS: Psychiatric disorder     Physical Exam Updated Vital Signs BP 112/77 (BP Location: Right Arm)   Pulse (!) 109   Temp (!) 97.5 F (36.4 C) (Oral)   Resp 20   SpO2 93%   Physical Exam  Constitutional: He appears well-developed.  Patient has bilateral handcuffs and his feet are in shackles.  HENT:  Head: Atraumatic.  Cardiovascular:  Patient with tachycardia  Pulmonary/Chest:  No respiratory distress.  Abdominal: He exhibits no distension.  Musculoskeletal:  No visible trauma.  Neurological: He is alert.  Skin: Skin is warm.  Psychiatric:  Patient is pressured and somewhat confused.     ED Treatments / Results  Labs (all labs ordered are listed, but only abnormal results are displayed) Labs Reviewed  COMPREHENSIVE METABOLIC PANEL - Abnormal; Notable for the following components:      Result Value   Sodium 160 (*)    Chloride 121 (*)    CO2 21 (*)    BUN 21 (*)    Creatinine, Ser 1.40 (*)    AST 61 (*)    Total Bilirubin 1.8 (*)    Anion gap 18 (*)    All other  components within normal limits  CBC WITH DIFFERENTIAL/PLATELET - Abnormal; Notable for the following components:   WBC 12.1 (*)    Hemoglobin 12.2 (*)    HCT 37.2 (*)    RDW 16.7 (*)    All other components within normal limits  LITHIUM LEVEL - Abnormal; Notable for the following components:   Lithium Lvl 0.31 (*)    All other components within normal limits  ETHANOL  URINALYSIS, ROUTINE W REFLEX MICROSCOPIC  RAPID URINE DRUG SCREEN, HOSP PERFORMED    EKG None  Radiology No results found.  Procedures Procedures (including critical care time)  Medications Ordered in ED Medications  LORazepam (ATIVAN) injection 2 mg (has no  administration in time range)  diphenhydrAMINE (BENADRYL) injection 50 mg (has no administration in time range)  sodium chloride 0.9 % bolus 1,000 mL (1,000 mLs Intravenous New Bag/Given 06/17/17 1528)  0.45 % sodium chloride infusion (has no administration in time range)     Initial Impression / Assessment and Plan / ED Course  I have reviewed the triage vital signs and the nursing notes.  Pertinent labs & imaging results that were available during my care of the patient were reviewed by me and considered in my medical decision making (see chart for details).     Patient with hyponatremia likely secondary to dehydration.  Has reportedly not been eating and drinking.  Also aggressive and manic.  Has not been taking his lithium.  Patient is in custody and reportedly can go back to jail for more psychiatric treatment after this however will need further clearance with his hyponatremia.  Will give fluid bolus followed by half-normal saline for some free water.  Care will be turned over to Dr. Donnald Garre  Final Clinical Impressions(s) / ED Diagnoses   Final diagnoses:  Manic behavior (HCC)  Noncompliance with medication regimen  Hypernatremia  Dehydration    ED Discharge Orders    None       Benjiman Core, MD 06/17/17 1626

## 2017-06-17 NOTE — ED Provider Notes (Signed)
Patient accepted at shift change.  Additional history obtained from guards.  Guard reports patient has been at the present for approximately 1 week.  For over 4 days now he has refused any thing to eat or drink.  He has refused all medications.  He reports that the prisoner has been standing almost the entire time with a constant rambling speech.  He was referred to the emergency department today for concern of the staff for dehydration and monitoring of vital signs due to refusal of all oral intake for greater than 3 days. Physical Exam  BP 112/77 (BP Location: Right Arm)   Pulse (!) 109   Temp (!) 97.5 F (36.4 C) (Oral)   Resp 20   SpO2 93%   Physical Exam  Constitutional:  Patient is alert and hypervigilant.  No respiratory distress.  He has a constant stream of consciousness rambling speech.  He is restrained on 4 extremities of shackles.  HENT:  Head: Normocephalic and atraumatic.  Mucous membranes very dry.  Eyes: Pupils are equal, round, and reactive to light. EOM are normal.  Neck: Neck supple.  Cardiovascular: Normal rate, regular rhythm, normal heart sounds and intact distal pulses.  Pulmonary/Chest: Effort normal and breath sounds normal.  Abdominal: Soft. He exhibits no distension. There is no tenderness.  Musculoskeletal:  Patient appears to have trace peripheral edema.  He is currently wearing shackles to the bed.  No areas of soft tissue injury or constriction from strain.  Patient is not fighting against the restraints.  He is constantly moving his arms to gesture as he speaks but not in a combative fashion.  Neurological: He is alert.  Patient is hyper alert.  When asked about the month or the year, he goes into a rambling sequence of numbers that are unrelated.  Speech then veers to multiple unrelated topics.  He is moving all 4 extremities at will.  No focal extremity neurologic deficits.  Skin: Skin is warm and dry.    ED Course/Procedures     Procedures  MDM   Consult: Tried hospitalist for admission.  At this time patient has 4 days of refusal of oral intake and significant hypernatremia at 160 mg/dL.  Patient will require IV hydration and medication management until he has stabilized medically.  At that time assessment can be made for final disposition based on psychiatric needs.  At this time patient is not medically cleared for psychiatric admission.       Arby Barrette, MD 06/17/17 [phone]

## 2017-06-17 NOTE — ED Notes (Signed)
Patient coming from jail-nurse states he is altered-unable to obtain vitals

## 2017-06-17 NOTE — H&P (Signed)
[facility] History and Physical  Brandon Fuentes ZOX:[id number] DOB: May 01, 1990 DOA: 06/17/2017  PCP: Patient, No Pcp Per  Patient coming from: [place]  Chief Complaint: Altered mental status  HPI: Brandon Fuentes is a 27 y.o. male with a medical history of bipolar disorder with a history of noncompliance.  Information in this history and physical were obtained from EDP as well as officers at bedside.  It seems that patient was in jail when he began having more altered mental status and psychosis due to noncompliance with his medications.  He has not been eating for the past several days and has not been taking his medications.  Patient has also been combative and noncooperative.  Currently he is singing and not responding to any questions.  ED Course: Patient found to have a sodium of 160, chloride of 121, creatinine 1.4.  Does appear to be manic.  Started on IV fluids.  [facility] called for admission.  Review of Systems:  All other systems reviewed and are negative.   Past Medical History:  Diagnosis Date  . Bipolar 1 disorder [facility])     Surgical History Cannot obtain from patient currently as he is altered.  Social History:  reports that he has never smoked. He has never used smokeless tobacco. He reports that he does not drink alcohol or use drugs.   Allergies  Allergen Reactions  . Atomoxetine Other (See Comments)    Increased agitation  . Depakote [Divalproex Sodium] Other (See Comments)    Sleepiness, anger  . Geodon [Ziprasidone Hcl] Other (See Comments)    Per mom pt get very agitated  . 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 Risperidone And Related     Per mother caused Neuroleptic malignant syndrome  . Zoloft [Sertraline Hcl]     anger  . Olanzapine Other (See Comments)    NMS - neuroleptic malignant syndrome    Family History  Problem Relation Age of Onset  . Diabetes Mother      Prior to Admission medications     Medication Sig Start Date End Date Taking? Authorizing Provider  lamoTRIgine (LAMICTAL) 25 MG tablet Take 25 mg by mouth every evening.    [provider]  lithium carbonate 300 MG capsule Take 300 mg by mouth 3 (three) times daily. 06/10/14   [provider]  mirtazapine (REMERON) 15 MG tablet Take 7.5 mg by mouth every evening.    [provider]    Physical Exam: Vitals:   06/17/17 1046  BP: 112/77  Pulse: (!) 109  Resp: 20  Temp: (!) 97.5 F (36.4 C)  SpO2: 93%     General: Well developed, well nourished, NAD, appears stated age  HEENT: NCAT, PERRLA, EOMI, Anicteic Sclera, mucous membranes dry  Neck: Supple, no JVD, no masses  Cardiovascular: S1 S2 auscultated, no rubs, murmurs or gallops. Regular rate and rhythm.  Respiratory: Clear to auscultation bilaterally with equal chest rise  Abdomen: Soft, obese, nontender, nondistended, + bowel sounds  Extremities: warm dry without cyanosis clubbing or edema  Neuro: Awake, and alert.  Currently singing and not cooperative with exam.  Will not answer questions.  Moves all extremities spontaneously, although restricted as patient is in 4 pt restraints (handcuffed to the bed)  Skin: Without rashes exudates or nodules  Psych: Manic  Labs on Admission: I have personally reviewed following labs and imaging studies CBC: Recent Labs  Lab 06/11/17 1134 06/17/17 1214  WBC 15.9* 12.1*  NEUTROABS  --  7.3  HGB 12.8* 12.2*  HCT 38.5* 37.2*  MCV 80.4 81.6  PLT 191 226   Basic Metabolic Panel: Recent Labs  Lab 06/11/17 1134 06/17/17 1214  NA 146* 160*  K 4.0 3.8  CL 108 121*  CO2 19* 21*  GLUCOSE 96 80  BUN 9 21*  CREATININE 1.38* 1.40*  CALCIUM 9.6 9.3   GFR: CrCl cannot be calculated (Unknown ideal weight.). Liver Function Tests: Recent Labs  Lab 06/11/17 1134 06/17/17 1214  AST 43* 61*  ALT 31 30  ALKPHOS 83 57  BILITOT 1.9* 1.8*  PROT 8.1 7.3  ALBUMIN 4.3 3.9   No results  for input(s): LIPASE, AMYLASE in the last 168 hours. No results for input(s): AMMONIA in the last 168 hours. Coagulation Profile: No results for input(s): INR, PROTIME in the last 168 hours. Cardiac Enzymes: No results for input(s): CKTOTAL, CKMB, CKMBINDEX, TROPONINI in the last 168 hours. BNP (last 3 results) No results for input(s): PROBNP in the last 8760 hours. HbA1C: No results for input(s): HGBA1C in the last 72 hours. CBG: No results for input(s): GLUCAP in the last 168 hours. Lipid Profile: No results for input(s): CHOL, HDL, LDLCALC, TRIG, CHOLHDL, LDLDIRECT in the last 72 hours. Thyroid Function Tests: No results for input(s): TSH, T4TOTAL, FREET4, T3FREE, THYROIDAB in the last 72 hours. Anemia Panel: No results for input(s): VITAMINB12, FOLATE, FERRITIN, TIBC, IRON, RETICCTPCT in the last 72 hours. Urine analysis:    Component Value Date/Time   COLORURINE YELLOW 05/08/2017 1809   APPEARANCEUR CLEAR 05/08/2017 1809   LABSPEC 1.009 05/08/2017 1809   PHURINE 8.0 05/08/2017 1809   GLUCOSEU NEGATIVE 05/08/2017 1809   HGBUR NEGATIVE 05/08/2017 1809   BILIRUBINUR NEGATIVE 05/08/2017 1809   KETONESUR NEGATIVE 05/08/2017 1809   PROTEINUR NEGATIVE 05/08/2017 1809   NITRITE NEGATIVE 05/08/2017 1809   LEUKOCYTESUR NEGATIVE 05/08/2017 1809   Sepsis Labs: (procalcitonin:4,lacticidven:4) )No results found for this or any previous visit (from the past 240 hour(s)).   Radiological Exams on Admission: No results found.  EKG: None  Assessment/Plan  Hypovolemic hypernatremia/hyperchloremia -Secondary to poor oral intake, dehydration -Upon admission, sodium 160, chloride 121 -Will continue IV fluids with half-normal saline -Currently, patient has no neurological deficits. -Will conduct neuro checks every 4 hours as well as BMP  Acute kidney injury -Secondary to poor oral intake as well as dehydration -Baseline creatinine approximately 0.9-1.  Currently creatinine  1.40 -Treatment and plan as above  SIRS -Presented with leukocytosis, WBC 12.1, as well as tachycardia -Affect this is reactive not infectious, however will obtain UA as well as chest x-ray to rule out infection -Will hold off on antibiotics at this time -Continue IV fluid and monitor CBC  Acute metabolic encephalopathy/bipolar disorder -It seems that patient has not taken his medications recently and is currently manic -Police officers at bedside -Do not think patient would stay still for CT head  -Will continue lamotrigine, Lamictal, Remeron -Will consult psychiatry  Elevated anion gap metabolic acidosis -Anion gap of 18, CO2 21-suspect secondary to starvation/ketosis -Continue IV fluids and monitor BMP  Elevated AST -Suspect secondary to dehydration vs chronic -Upon review of patient's chart patient has had a mildly elevated AST since 2011 -Will place patient on IV fluids and continue to monitor CMP  Chronic anemia -Recently, hemoglobin has ranged between 12 and 13, currently 12.2 -Suspect that there may also be a drop in hemoglobin once patient is rehydrated -Will continue to monitor CBC  Insomnia -Continue Remeron  DVT prophylaxis: Lovenox  Code Status: Full  Family Communication: None at bedside.    Disposition Plan: Jail when stable  Consults called: Psychiatry, consult placed with an epic  Admission status: Observation  Time spent: 65 minutes  Shakeisha Horine D.O. [facility] Pager [phone]  If 7PM-7AM, please contact night-coverage [email or web address] Password [facility] [place] 06/17/2017, 5:36 PM

## 2017-06-17 NOTE — ED Notes (Signed)
Bed: ZO10 Expected date:  Expected time:  Means of arrival:  Comments: EMS-altered

## 2017-06-17 NOTE — ED Notes (Signed)
Pt resting with no acute distress noted. Respirations even and unlabored.

## 2017-06-18 ENCOUNTER — Observation Stay (HOSPITAL_COMMUNITY): Payer: 59

## 2017-06-18 DIAGNOSIS — Z5329 Procedure and treatment not carried out because of patient's decision for other reasons: Secondary | ICD-10-CM

## 2017-06-18 DIAGNOSIS — R450 Nervousness: Secondary | ICD-10-CM | POA: Diagnosis not present

## 2017-06-18 DIAGNOSIS — R4587 Impulsiveness: Secondary | ICD-10-CM

## 2017-06-18 DIAGNOSIS — F419 Anxiety disorder, unspecified: Secondary | ICD-10-CM | POA: Diagnosis not present

## 2017-06-18 DIAGNOSIS — F312 Bipolar disorder, current episode manic severe with psychotic features: Secondary | ICD-10-CM | POA: Diagnosis not present

## 2017-06-18 DIAGNOSIS — R945 Abnormal results of liver function studies: Secondary | ICD-10-CM | POA: Diagnosis not present

## 2017-06-18 DIAGNOSIS — F5089 Other specified eating disorder: Secondary | ICD-10-CM

## 2017-06-18 DIAGNOSIS — E872 Acidosis: Secondary | ICD-10-CM | POA: Diagnosis not present

## 2017-06-18 DIAGNOSIS — E860 Dehydration: Secondary | ICD-10-CM | POA: Diagnosis not present

## 2017-06-18 DIAGNOSIS — E870 Hyperosmolality and hypernatremia: Secondary | ICD-10-CM | POA: Diagnosis not present

## 2017-06-18 DIAGNOSIS — R451 Restlessness and agitation: Secondary | ICD-10-CM

## 2017-06-18 LAB — COMPREHENSIVE METABOLIC PANEL
ALK PHOS: 57 U/L (ref 38–126)
ALT: 28 U/L (ref 17–63)
AST: 44 U/L — ABNORMAL HIGH (ref 15–41)
Albumin: 3.7 g/dL (ref 3.5–5.0)
Anion gap: 12 (ref 5–15)
BILIRUBIN TOTAL: 1.9 mg/dL — AB (ref 0.3–1.2)
BUN: 12 mg/dL (ref 6–20)
CALCIUM: 8.8 mg/dL — AB (ref 8.9–10.3)
CO2: 24 mmol/L (ref 22–32)
CREATININE: 1.03 mg/dL (ref 0.61–1.24)
Chloride: 111 mmol/L (ref 101–111)
Glucose, Bld: 117 mg/dL — ABNORMAL HIGH (ref 65–99)
Potassium: 3 mmol/L — ABNORMAL LOW (ref 3.5–5.1)
Sodium: 147 mmol/L — ABNORMAL HIGH (ref 135–145)
TOTAL PROTEIN: 7.2 g/dL (ref 6.5–8.1)

## 2017-06-18 LAB — CBC
HCT: 38.7 % — ABNORMAL LOW (ref 39.0–52.0)
HEMOGLOBIN: 12.9 g/dL — AB (ref 13.0–17.0)
MCH: 26.5 pg (ref 26.0–34.0)
MCHC: 33.3 g/dL (ref 30.0–36.0)
MCV: 79.6 fL (ref 78.0–100.0)
Platelets: 236 10*3/uL (ref 150–400)
RBC: 4.86 MIL/uL (ref 4.22–5.81)
RDW: 16.9 % — ABNORMAL HIGH (ref 11.5–15.5)
WBC: 11.4 10*3/uL — ABNORMAL HIGH (ref 4.0–10.5)

## 2017-06-18 LAB — BASIC METABOLIC PANEL
ANION GAP: 12 (ref 5–15)
Anion gap: 13 (ref 5–15)
Anion gap: 13 (ref 5–15)
BUN: 11 mg/dL (ref 6–20)
BUN: 18 mg/dL (ref 6–20)
BUN: 18 mg/dL (ref 6–20)
CHLORIDE: 113 mmol/L — AB (ref 101–111)
CHLORIDE: 111 mmol/L (ref 101–111)
CO2: 23 mmol/L (ref 22–32)
CO2: 22 mmol/L (ref 22–32)
CO2: 22 mmol/L (ref 22–32)
CREATININE: 1.05 mg/dL (ref 0.61–1.24)
Calcium: 8.3 mg/dL — ABNORMAL LOW (ref 8.9–10.3)
Calcium: 9 mg/dL (ref 8.9–10.3)
Calcium: 9 mg/dL (ref 8.9–10.3)
Chloride: 115 mmol/L — ABNORMAL HIGH (ref 101–111)
Creatinine, Ser: 3.31 mg/dL — ABNORMAL HIGH (ref 0.61–1.24)
Creatinine, Ser: 3.44 mg/dL — ABNORMAL HIGH (ref 0.61–1.24)
GFR calc non Af Amer: 23 mL/min — ABNORMAL LOW (ref >60)
GFR calc non Af Amer: 24 mL/min — ABNORMAL LOW (ref >60)
GFR calc non Af Amer: >60 mL/min (ref >60)
GFR, EST AFRICAN AMERICAN: 27 mL/min — AB (ref >60)
GFR, EST AFRICAN AMERICAN: 28 mL/min — AB (ref >60)
GLUCOSE: 96 mg/dL (ref 65–99)
GLUCOSE: 114 mg/dL — AB (ref 65–99)
GLUCOSE: 121 mg/dL — AB (ref 65–99)
POTASSIUM: 3.8 mmol/L (ref 3.5–5.1)
Potassium: 4.7 mmol/L (ref 3.5–5.1)
Potassium: 5.5 mmol/L — ABNORMAL HIGH (ref 3.5–5.1)
SODIUM: 150 mmol/L — AB (ref 135–145)
Sodium: 147 mmol/L — ABNORMAL HIGH (ref 135–145)
Sodium: 147 mmol/L — ABNORMAL HIGH (ref 135–145)

## 2017-06-18 LAB — MRSA PCR SCREENING: MRSA by PCR: POSITIVE — AB

## 2017-06-18 MED ORDER — POTASSIUM CHLORIDE CRYS ER 20 MEQ PO TBCR
60.0000 meq | EXTENDED_RELEASE_TABLET | Freq: Once | ORAL | Status: DC
  Filled 2017-06-18: qty 3

## 2017-06-18 MED ORDER — MUPIROCIN 2 % EX OINT
TOPICAL_OINTMENT | CUTANEOUS | Status: AC
  Filled 2017-06-18: qty 22

## 2017-06-18 MED ORDER — MUPIROCIN 2 % EX OINT
TOPICAL_OINTMENT | Freq: Two times a day (BID) | CUTANEOUS | Status: DC
  Administered 2017-06-19: 09:00:00 via NASAL

## 2017-06-18 MED ORDER — POTASSIUM CHLORIDE CRYS ER 20 MEQ PO TBCR: 40.0000 meq | EXTENDED_RELEASE_TABLET | Freq: Once | ORAL | Status: DC

## 2017-06-18 MED ORDER — FUROSEMIDE 10 MG/ML IJ SOLN
20.0000 mg | Freq: Once | INTRAMUSCULAR | Status: AC
  Administered 2017-06-18: 20 mg via INTRAVENOUS
  Filled 2017-06-18: qty 2

## 2017-06-18 MED ORDER — HYDROXYZINE HCL 25 MG PO TABS
50.0000 mg | ORAL_TABLET | Freq: Three times a day (TID) | ORAL | Status: DC | PRN
Start: 2017-06-18 — End: 2017-06-19
  Administered 2017-06-18: 50 mg via ORAL
  Filled 2017-06-18: qty 2

## 2017-06-18 MED ORDER — LORAZEPAM 2 MG/ML IJ SOLN
1.0000 mg | Freq: Once | INTRAMUSCULAR | Status: AC
Start: 2017-06-19 — End: 2017-06-19
  Administered 2017-06-19: 1 mg via INTRAVENOUS
  Filled 2017-06-18: qty 1

## 2017-06-18 MED ORDER — POTASSIUM CHLORIDE 10 MEQ/100ML IV SOLN
INTRAVENOUS | Status: AC
  Administered 2017-06-18: 10 meq
  Filled 2017-06-18: qty 100

## 2017-06-18 MED ORDER — ENSURE ENLIVE PO LIQD
237.0000 mL | Freq: Two times a day (BID) | ORAL | Status: DC
  Administered 2017-06-19 (×2): 237 mL via ORAL

## 2017-06-18 MED ORDER — LORAZEPAM 2 MG/ML IJ SOLN
1.0000 mg | Freq: Once | INTRAMUSCULAR | Status: AC
  Administered 2017-06-18: 1 mg via INTRAVENOUS
  Filled 2017-06-18: qty 1

## 2017-06-18 NOTE — Progress Notes (Signed)
PROGRESS NOTE    Brandon Fuentes  NFA:[id number] DOB: [birth year] DOA: 06/17/2017 PCP: Patient, No Pcp Per   Brief Narrative:  27 year old male history of bipolar disorder and noncompliance presented with altered mental status.  Found to have hypernatremia, hyperchloremia, AKI- improving.  Assessment & Plan   Hypovolemic hypernatremia/hyperchloremia -Secondary to poor oral intake, dehydration -Upon admission, sodium 160, chloride 121 -Patient placed on IV fluids, currently sodium 147, chloride 111 -Patient had no neurological deficits on admission -Continue to monitor BMP  Acute kidney injury -Secondary to poor oral intake as well as dehydration -Baseline creatinine approximately 0.9-1.  On admission, creatinine was 1.4 however creatinine rose to 3.44 (question if this lab was accurate).  Repeat creatinine 1.03 -Treatment and plan as above -Monitor strict intake and output  SIRS -Presented with leukocytosis, WBC 12.1, as well as tachycardia -Suspect this is reactive not infectious as UA and chest x-ray unremarkable for infection. -WBC improved 11.4  Acute metabolic encephalopathy/bipolar disorder -It seems that patient has not taken his medications recently and is currently manic -Police officers at bedside -Do not think patient would stay still for CT head  -Continue ontinue lamotrigine, Lamictal, Remeron -Psychiatry consulted and pending  Elevated anion gap metabolic acidosis -Admission, anion gap of 18, CO2 21-suspect secondary to starvation/ketosis -Anion gap normalized, CO2 24  Elevated AST -Suspect secondary to dehydration vs chronic -Upon review of patient's chart patient has had a mildly elevated AST since 2011 -AST currently 44  Chronic anemia -Stable, baseline hemoglobin between 12 and 13, currently 12.9  -Continue to monitor CBC  Insomnia -Continue Remeron  DVT Prophylaxis Lovenox  Code Status: Full  Family Communication: None at  bedside  Disposition Plan: Observation, Jail in stable, likely 06/19/2017  Consultants None  Procedures  None  Antibiotics   Anti-infectives (From admission, onward)   None      Subjective:   Brandon Fuentes seen and examined today.  Patient still manic, mildly interactive.  Does not truly answer questions appropriately.  Currently singing.   Objective:   Vitals:   06/17/17 1858 06/17/17 2135 06/18/17 0600 06/18/17 1459  BP: 138/88 (!) 122/97 (!) 153/96 (!) 147/98  Pulse: 90 85  (!) 58  Resp: Temp: 98 F (36.7 C) 98.4 F (36.9 C)  98.3 F (36.8 C)  TempSrc: Axillary Oral  Oral  SpO2: 100% 100%  100%    Intake/Output Summary (Last 24 hours) at 06/18/2017 1505 Last data filed at 06/18/2017 0700 Gross per 24 hour  Intake 840 ml  Output -  Net 840 ml   There were no vitals filed for this visit.  Exam  General: Well developed, well nourished, NAD, appears stated age  HEENT: NCAT, mucous membranes moist.   Neck: Supple  Cardiovascular: S1 S2 auscultated, no rubs, murmurs or gallops. Regular rate and rhythm.  Respiratory: Clear to auscultation bilaterally with equal chest rise  Abdomen: Soft, obese, nontender, nondistended, + bowel sounds  Extremities: warm dry without cyanosis clubbing or edema  Neuro: Awake and alert, currently singing and not cooperative with exam.  Moves all extremities spontaneously, although in four-point restraints (handcuffs).  Psych: manic   Data Reviewed: I have personally reviewed following labs and imaging studies  CBC: Recent Labs  Lab 06/17/17 1214 06/18/17 1129  WBC 12.1* 11.4*  NEUTROABS 7.3  --   HGB 12.2* 12.9*  HCT 37.2* 38.7*  MCV 81.6 79.6  PLT 226 236   Basic Metabolic Panel: Recent Labs  Lab 06/17/17 1214  06/18/17 0009 06/18/17 0332 06/18/17 1129  NA 160* 150* 147* 147*  K 3.8 4.7 5.5* 3.0*  CL 121* 115* 113* 111  CO2 21* GLUCOSE 80 121* 114* 117*  BUN 21* CREATININE 1.40* 3.31* 3.44* 1.03  CALCIUM 9.3 9.0 9.0 8.8*   GFR: CrCl cannot be calculated (Unknown ideal weight.). Liver Function Tests: Recent Labs  Lab 06/17/17 1214 06/18/17 1129  AST 61* 44*  ALT 30 28  ALKPHOS 57 57  BILITOT 1.8* 1.9*  PROT 7.3 7.2  ALBUMIN 3.9 3.7   No results for input(s): LIPASE, AMYLASE in the last 168 hours. No results for input(s): AMMONIA in the last 168 hours. Coagulation Profile: No results for input(s): INR, PROTIME in the last 168 hours. Cardiac Enzymes: No results for input(s): CKTOTAL, CKMB, CKMBINDEX, TROPONINI in the last 168 hours. BNP (last 3 results) No results for input(s): PROBNP in the last 8760 hours. HbA1C: No results for input(s): HGBA1C in the last 72 hours. CBG: No results for input(s): GLUCAP in the last 168 hours. Lipid Profile: No results for input(s): CHOL, HDL, LDLCALC, TRIG, CHOLHDL, LDLDIRECT in the last 72 hours. Thyroid Function Tests: No results for input(s): TSH, T4TOTAL, FREET4, T3FREE, THYROIDAB in the last 72 hours. Anemia Panel: No results for input(s): VITAMINB12, FOLATE, FERRITIN, TIBC, IRON, RETICCTPCT in the last 72 hours. Urine analysis:    Component Value Date/Time   COLORURINE YELLOW 06/17/2017 1747   APPEARANCEUR CLEAR 06/17/2017 1747   LABSPEC 1.034 (H) 06/17/2017 1747   PHURINE 5.0 06/17/2017 1747   GLUCOSEU NEGATIVE 06/17/2017 1747   HGBUR NEGATIVE 06/17/2017 1747   BILIRUBINUR SMALL (A) 06/17/2017 1747   KETONESUR 80 (A) 06/17/2017 1747   PROTEINUR 100 (A) 06/17/2017 1747   NITRITE NEGATIVE 06/17/2017 1747   LEUKOCYTESUR NEGATIVE 06/17/2017 1747   Sepsis Labs: (procalcitonin:4,lacticidven:4)  ) Recent Results (from the past 240 hour(s))  MRSA PCR Screening     Status: Abnormal   Collection Time: 06/18/17  9:40 AM  Result Value Ref Range Status   MRSA by PCR POSITIVE (A) NEGATIVE Final    Comment:        The GeneXpert MRSA Assay (FDA approved for NASAL  specimens only), is one component of a comprehensive MRSA colonization surveillance program. It is not intended to diagnose MRSA infection nor to guide or monitor treatment for MRSA infections. RESULT CALLED TO, READ BACK BY AND VERIFIED WITH: Hubbard Robinson [id number] @ 1414 BY J SCOTTON Performed at [facility], [address], [place], [place] [address]       Radiology Studies: Dg Chest Port 1 View  Result Date: 06/17/2017 CLINICAL DATA:  Leukocytosis EXAM: PORTABLE CHEST 1 VIEW COMPARISON:  05/15/2017 chest radiograph. FINDINGS: Stable cardiomediastinal silhouette with normal heart size. No pneumothorax. No pleural effusion. No pulmonary edema. No acute consolidative airspace disease. Hazy right lung base opacity described on prior chest radiograph is largely resolved. IMPRESSION: No acute cardiopulmonary disease. Previously described hazy right lung base opacity on 05/15/2017 chest radiographs appears largely resolved. Short-term follow-up PA and lateral chest radiographs are advised to document complete resolution. Electronically Signed   By: Delbert Phenix M.D.   On: 06/17/2017 19:30     Scheduled Meds: . enoxaparin (LOVENOX) injection  40 mg Subcutaneous Q24H  . feeding supplement (ENSURE ENLIVE)  237 mL Oral BID BM  . lamoTRIgine  25 mg Oral QPM  . lithium carbonate  300 mg Oral TID  . LORazepam  1 mg Intravenous Once  . mirtazapine  7.5 mg Oral QPM  . potassium chloride  60 mEq Oral Once   Continuous Infusions: . sodium chloride 50 mL/hr at 06/18/17 0720     LOS: 0 days   Time Spent in minutes   30 minutes  Caileigh Canche D.O. on 06/18/2017 at 3:05 PM  Between 7am to 7pm - Pager - [phone]  After 7pm go to [email or web address] - password [email or web address]  And look for the night coverage person covering for me after hours  [facility] Office  [phone]

## 2017-06-18 NOTE — Consult Note (Signed)
[facility], [place] Face-to-Face Psychiatry Consult   Reason for Consult:  Mania with psychosis Referring Physician:  EDP Patient Identification: Brandon Fuentes MRN:  [id number] Principal Diagnosis: Bipolar disorder, curr episode mixed, severe, w/o psychotic features ([place]) Diagnosis:   Patient Active Problem List   Diagnosis Date Noted  . Bipolar disorder, curr episode mixed, severe, w/o psychotic features ([place]) [F31.63] 04/10/2016    Priority: High  . Dehydration [E86.0] 06/17/2017  . Hypernatremia [E87.0] 06/17/2017  . Increased anion gap metabolic acidosis [B63.8] 06/17/2017  . Rhabdomyolysis [M62.82] 05/15/2017  . Pneumomediastinum ([place]) [J98.2]   . Pneumopericardium [I31.9]   . Elevated LFTs [R94.5]   . Bipolar I disorder, current or most recent episode manic, severe ([place]) [F31.13] 04/01/2016  . 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 Psychosis ([place]) [F29] 04/01/2016    Total Time spent with patient: 45 minutes  Subjective:   Brandon Fuentes is a 27 y.o. male patient with mania:  Tangential, pressured speech, agitated, hallucinating.  Recommend Lithium 300 mg TID for mood stabilization and Vistaril 50 mg TID for agitation.  Ativan 2 mg IV has been used in the past, may give every 2 hours as needed for agitation (not allergic but thinks he is).  Dr Nancy Fetter, psychiatrist, has reviewed this patient and concurs with the plan.  HPI:  27 yo male who [facility] ED from jail after refusing medications, food, and liquids in manic, agitated, psychotic state.  He is well known to this provider and will only take Lithium and Vistaril willingly.  Last month, he was on a medical floor and stabilized with Lithium 300 mg TID for mood stabilization and Vistaril 50 mg TID agitation.  Currently, he is psychotic and agitated.  Would benefit from an antipsychotic but he and his mother are adamant about him not having these do to past experience of neuroleptic malignant syndrome.  Ativan, despite not being an  antipsychotic, is grouped with these medications for the patient.  However, he has had this in the past with no issues--IV is very beneficial for him in this current manic state.  When he is stable, he is to transfer to [facility].  Past Psychiatric History: bipolar disorder  Risk to Self: Is patient at risk for suicide?: No, but patient needs Medical Clearance Risk to Others:  Yes Prior Inpatient Therapy:  Yes Prior Outpatient Therapy:  Yes  Past Medical History:  Past Medical History:  Diagnosis Date  . Bipolar 1 disorder ([place])    History reviewed. No pertinent surgical history. Family History:  Family History  Problem Relation Age of Onset  . Diabetes Mother    Family Psychiatric  History: none Social History:  Social History   Substance and Sexual Activity  Alcohol Use No     Social History   Substance and Sexual Activity  Drug Use No    Social History   Socioeconomic History  . Marital status: Single    Spouse name: Not on file  . Number of children: Not on file  . Years of education: Not on file  . Highest education level: Not on file  Occupational History  . Not on file  Social Needs  . Financial resource strain: Patient refused  . Food insecurity:    Worry: Patient refused    Inability: Patient refused  . Transportation needs:    Medical: Patient refused    Non-medical: Patient refused  Tobacco Use  . Smoking status: Never Smoker  . Smokeless tobacco: Never Used  Substance and Sexual  Activity  . Alcohol use: No  . Drug use: No  . Sexual activity: Yes    Birth control/protection: None  Lifestyle  . Physical activity:    Days per week: Not on file    Minutes per session: Not on file  . Stress: Not on file  Relationships  . Social connections:    Talks on phone: Not on file    Gets together: Not on file    Attends religious service: Not on file    Active member of club or organization: Not on file    Attends meetings of clubs or  organizations: Not on file    Relationship status: Not on file  Other Topics Concern  . Not on file  Social History Narrative  . Not on file   Additional Social History:    Allergies:   Allergies  Allergen Reactions  . Atomoxetine Other (See Comments)    Increased agitation  . Depakote [Divalproex Sodium] Other (See Comments)    Sleepiness, anger  . Geodon [Ziprasidone Hcl] Other (See Comments)    Per mom pt get very agitated  . 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 Risperidone And Related     Per mother caused Neuroleptic malignant syndrome  . Zoloft [Sertraline Hcl]     anger  . Olanzapine Other (See Comments)    NMS - neuroleptic malignant syndrome    Labs:  Results for orders placed or performed during the hospital encounter of 06/17/17 (from the past 48 hour(s))  Comprehensive metabolic panel     Status: Abnormal   Collection Time: 06/17/17 12:14 PM  Result Value Ref Range   Sodium 160 (H) 135 - 145 mmol/L   Potassium 3.8 3.5 - 5.1 mmol/L   Chloride 121 (H) 101 - 111 mmol/L   CO2 21 (L) 22 - 32 mmol/L   Glucose, Bld 80 65 - 99 mg/dL   BUN 21 (H) 6 - 20 mg/dL   Creatinine, Ser 1.40 (H) 0.61 - 1.24 mg/dL   Calcium 9.3 8.9 - 10.3 mg/dL   Total Protein 7.3 6.5 - 8.1 g/dL   Albumin 3.9 3.5 - 5.0 g/dL   AST 61 (H) 15 - 41 U/L   ALT 30 17 - 63 U/L   Alkaline Phosphatase 57 38 - 126 U/L   Total Bilirubin 1.8 (H) 0.3 - 1.2 mg/dL   GFR calc non Af Amer >60 >60 mL/min   GFR calc Af Amer >60 >60 mL/min    Comment: (NOTE) The eGFR has been calculated using the CKD EPI equation. This calculation has not been validated in all clinical situations. eGFR's persistently <60 mL/min signify possible Chronic Kidney Disease.    Anion gap 18 (H) 5 - 15    Comment: Performed at [facility], [place] [address], [place], [place] [address]  Ethanol     Status: None   Collection Time: 06/17/17 12:14 PM  Result Value Ref  Range   Alcohol, Ethyl (B) <10 <10 mg/dL    Comment: (NOTE) Lowest detectable limit for serum alcohol is 10 mg/dL. For medical purposes only. Performed at [facility], [place] [address], [place], [place] [address]   CBC with Differential     Status: Abnormal   Collection Time: 06/17/17 12:14 PM  Result Value Ref Range   WBC 12.1 (H) 4.0 - 10.5 K/uL   RBC 4.56 4.22 - 5.81 MIL/uL   Hemoglobin 12.2 (L) 13.0 - 17.0  g/dL   HCT 37.2 (L) 39.0 - 52.0 %   MCV 81.6 78.0 - 100.0 fL   MCH 26.8 26.0 - 34.0 pg   MCHC 32.8 30.0 - 36.0 g/dL   RDW 16.7 (H) 11.5 - 15.5 %   Platelets 226 150 - 400 K/uL   Neutrophils Relative % 61 %   Neutro Abs 7.3 1.7 - 7.7 K/uL   Lymphocytes Relative 28 %   Lymphs Abs 3.4 0.7 - 4.0 K/uL   Monocytes Relative 9 %   Monocytes Absolute 1.0 0.1 - 1.0 K/uL   Eosinophils Relative 2 %   Eosinophils Absolute 0.2 0.0 - 0.7 K/uL   Basophils Relative 0 %   Basophils Absolute 0.1 0.0 - 0.1 K/uL    Comment: Performed at [facility], [place] [address], [place], [place] [address]  Lithium level     Status: Abnormal   Collection Time: 06/17/17 12:15 PM  Result Value Ref Range   Lithium Lvl 0.31 (L) 0.60 - 1.20 mmol/L    Comment: Performed at [facility], [place] [address] [place], [place] [address]  Urinalysis, Routine w reflex microscopic     Status: Abnormal   Collection Time: 06/17/17  4:40 PM  Result Value Ref Range   Color, Urine YELLOW YELLOW   APPearance CLEAR CLEAR   Specific Gravity, Urine 1.034 (H) 1.005 - 1.030   pH 5.0 5.0 - 8.0   Glucose, UA NEGATIVE NEGATIVE mg/dL   Hgb urine dipstick NEGATIVE NEGATIVE   Bilirubin Urine SMALL (A) NEGATIVE   Ketones, ur 80 (A) NEGATIVE mg/dL   Protein, ur 100 (A) NEGATIVE mg/dL   Nitrite NEGATIVE NEGATIVE   Leukocytes, UA NEGATIVE NEGATIVE   RBC / HPF 0-5 0 - 5 RBC/hpf   WBC, UA 0-5 0 - 5 WBC/hpf   Bacteria, UA NONE SEEN NONE SEEN   Mucus PRESENT     Comment:  Performed at [facility], [place] [address], [place], [place] [address]  Urine rapid drug screen (hosp performed)     Status: Abnormal   Collection Time: 06/17/17  4:40 PM  Result Value Ref Range   Opiates NONE DETECTED NONE DETECTED   Cocaine NONE DETECTED NONE DETECTED   Benzodiazepines POSITIVE (A) NONE DETECTED   Amphetamines NONE DETECTED NONE DETECTED   Tetrahydrocannabinol NONE DETECTED NONE DETECTED   Barbiturates NONE DETECTED NONE DETECTED    Comment: (NOTE) DRUG SCREEN FOR MEDICAL PURPOSES ONLY.  IF CONFIRMATION IS NEEDED FOR ANY PURPOSE, NOTIFY LAB WITHIN 5 DAYS. LOWEST DETECTABLE LIMITS FOR URINE DRUG SCREEN Drug Class                     Cutoff (ng/mL) Amphetamine and metabolites    1000 Barbiturate and metabolites    200 Benzodiazepine                 741 Tricyclics and metabolites     300 Opiates and metabolites        300 Cocaine and metabolites        300 THC                            50 Performed at [facility], [place] [address], [place], [place] [address]   Urinalysis, Routine w reflex microscopic     Status: Abnormal   Collection Time: 06/17/17  5:47 PM  Result Value Ref Range   Color, Urine  YELLOW YELLOW   APPearance CLEAR CLEAR   Specific Gravity, Urine 1.034 (H) 1.005 - 1.030   pH 5.0 5.0 - 8.0   Glucose, UA NEGATIVE NEGATIVE mg/dL   Hgb urine dipstick NEGATIVE NEGATIVE   Bilirubin Urine SMALL (A) NEGATIVE   Ketones, ur 80 (A) NEGATIVE mg/dL   Protein, ur 100 (A) NEGATIVE mg/dL   Nitrite NEGATIVE NEGATIVE   Leukocytes, UA NEGATIVE NEGATIVE   RBC / HPF 0-5 0 - 5 RBC/hpf   WBC, UA 0-5 0 - 5 WBC/hpf   Bacteria, UA RARE (A) NONE SEEN   Squamous Epithelial / LPF 0-5 0 - 5   Mucus PRESENT     Comment: Performed at [facility], [place] [address], [place], [place] [address]  Basic metabolic panel     Status: Abnormal   Collection Time: 06/18/17 12:09 AM  Result Value Ref Range   Sodium 150 (H) 135  - 145 mmol/L    Comment: DELTA CHECK NOTED REPEATED TO VERIFY    Potassium 4.7 3.5 - 5.1 mmol/L    Comment: DELTA CHECK NOTED NO VISIBLE HEMOLYSIS REPEATED TO VERIFY    Chloride 115 (H) 101 - 111 mmol/L   CO2 22 22 - 32 mmol/L   Glucose, Bld 121 (H) 65 - 99 mg/dL   BUN 18 6 - 20 mg/dL   Creatinine, Ser 3.31 (H) 0.61 - 1.24 mg/dL    Comment: DELTA CHECK NOTED REPEATED TO VERIFY    Calcium 9.0 8.9 - 10.3 mg/dL   GFR calc non Af Amer 24 (L) >60 mL/min   GFR calc Af Amer 28 (L) >60 mL/min    Comment: (NOTE) The eGFR has been calculated using the CKD EPI equation. This calculation has not been validated in all clinical situations. eGFR's persistently <60 mL/min signify possible Chronic Kidney Disease.    Anion gap 13 5 - 15    Comment: Performed at [facility], [place] [address] [place], [place] [address]  Basic metabolic panel     Status: Abnormal   Collection Time: 06/18/17  3:32 AM  Result Value Ref Range   Sodium 147 (H) 135 - 145 mmol/L   Potassium 5.5 (H) 3.5 - 5.1 mmol/L   Chloride 113 (H) 101 - 111 mmol/L   CO2 22 22 - 32 mmol/L   Glucose, Bld 114 (H) 65 - 99 mg/dL   BUN 18 6 - 20 mg/dL   Creatinine, Ser 3.44 (H) 0.61 - 1.24 mg/dL   Calcium 9.0 8.9 - 10.3 mg/dL   GFR calc non Af Amer 23 (L) >60 mL/min   GFR calc Af Amer 27 (L) >60 mL/min    Comment: (NOTE) The eGFR has been calculated using the CKD EPI equation. This calculation has not been validated in all clinical situations. eGFR's persistently <60 mL/min signify possible Chronic Kidney Disease.    Anion gap 12 5 - 15    Comment: Performed at [facility], [place] [address] [place], [place] [address]  MRSA PCR Screening     Status: Abnormal   Collection Time: 06/18/17  9:40 AM  Result Value Ref Range   MRSA by PCR POSITIVE (A) NEGATIVE    Comment:        The GeneXpert MRSA Assay (FDA approved for NASAL specimens only), is one component of a comprehensive MRSA  colonization surveillance program. It is not intended to diagnose MRSA infection nor to guide or monitor treatment for MRSA infections. RESULT CALLED TO, READ  BACK BY AND VERIFIED WITH: Vincenza Hews [id number] @ [address] Performed at [place] [address] [place], [place] [address]   CBC     Status: Abnormal   Collection Time: 06/18/17 11:29 AM  Result Value Ref Range   WBC 11.4 (H) 4.0 - 10.5 K/uL   RBC 4.86 4.22 - 5.81 MIL/uL   Hemoglobin 12.9 (L) 13.0 - 17.0 g/dL   HCT 38.7 (L) 39.0 - 52.0 %   MCV 79.6 78.0 - 100.0 fL   MCH 26.5 26.0 - 34.0 pg   MCHC 33.3 30.0 - 36.0 g/dL   RDW 16.9 (H) 11.5 - 15.5 %   Platelets 236 150 - 400 K/uL    Comment: Performed at [facility], [place] [address] [place], [place] [address]  Comprehensive metabolic panel     Status: Abnormal   Collection Time: 06/18/17 11:29 AM  Result Value Ref Range   Sodium 147 (H) 135 - 145 mmol/L   Potassium 3.0 (L) 3.5 - 5.1 mmol/L   Chloride 111 101 - 111 mmol/L   CO2 24 22 - 32 mmol/L   Glucose, Bld 117 (H) 65 - 99 mg/dL   BUN 12 6 - 20 mg/dL    Comment: REPEATED TO VERIFY   Creatinine, Ser 1.03 0.61 - 1.24 mg/dL    Comment: DELTA CHECK NOTED REPEATED TO VERIFY    Calcium 8.8 (L) 8.9 - 10.3 mg/dL   Total Protein 7.2 6.5 - 8.1 g/dL   Albumin 3.7 3.5 - 5.0 g/dL   AST 44 (H) 15 - 41 U/L   ALT 28 17 - 63 U/L   Alkaline Phosphatase 57 38 - 126 U/L   Total Bilirubin 1.9 (H) 0.3 - 1.2 mg/dL   GFR calc non Af Amer >60 >60 mL/min   GFR calc Af Amer >60 >60 mL/min    Comment: (NOTE) The eGFR has been calculated using the CKD EPI equation. This calculation has not been validated in all clinical situations. eGFR's persistently <60 mL/min signify possible Chronic Kidney Disease.    Anion gap 12 5 - 15    Comment: Performed at [facility], [place] [address], [place], [place] [address]  Basic metabolic panel     Status: Abnormal (Preliminary  result)   Collection Time: 06/18/17  2:25 PM  Result Value Ref Range   Sodium 147 (H) 135 - 145 mmol/L   Potassium PENDING 3.5 - 5.1 mmol/L   Chloride 111 101 - 111 mmol/L   CO2 23 22 - 32 mmol/L   Glucose, Bld 96 65 - 99 mg/dL   BUN 11 6 - 20 mg/dL   Creatinine, Ser 1.05 0.61 - 1.24 mg/dL   Calcium 8.3 (L) 8.9 - 10.3 mg/dL   GFR calc non Af Amer >60 >60 mL/min   GFR calc Af Amer >60 >60 mL/min    Comment: (NOTE) The eGFR has been calculated using the CKD EPI equation. This calculation has not been validated in all clinical situations. eGFR's persistently <60 mL/min signify possible Chronic Kidney Disease.    Anion gap 13 5 - 15    Comment: Performed at [facility], [place] [address], [place], [place] [address]    Current Facility-Administered Medications  Medication Dose Route Frequency Provider Last Rate Last Dose  . 0.45 % sodium chloride infusion   Intravenous Continuous Cristal Ford, DO 50 mL/hr at 06/18/17 0720    . acetaminophen (TYLENOL) tablet 650 mg  650 mg Oral Q6H PRN Cristal Ford, DO       Or  . acetaminophen (TYLENOL) suppository 650 mg  650 mg Rectal Q6H PRN Mikhail, Velta Addison, DO      . enoxaparin (LOVENOX) injection 40 mg  40 mg Subcutaneous Q24H Mikhail, [place], DO      . feeding supplement (ENSURE ENLIVE) (ENSURE ENLIVE) liquid 237 mL  237 mL Oral BID BM Mikhail, Velta Addison, DO      . hydrOXYzine (ATARAX/VISTARIL) tablet 50 mg  50 mg Oral TID PRN Patrecia Pour, NP      . lamoTRIgine (LAMICTAL) tablet 25 mg  25 mg Oral QPM Mikhail, Velta Addison, DO   25 mg at 06/17/17 2237  . lithium carbonate capsule 300 mg  300 mg Oral TID Cristal Ford, DO   300 mg at 06/18/17 0932  . LORazepam (ATIVAN) injection 1 mg  1 mg Intravenous Once Cristal Ford, DO      . mirtazapine (REMERON) tablet 7.5 mg  7.5 mg Oral QPM Mikhail, [place], DO   7.5 mg at 06/17/17 1937  . mupirocin ointment (BACTROBAN) 2 %   Nasal BID Cristal Ford, DO      . ondansetron  [facility]) tablet 4 mg  4 mg Oral Q6H PRN Cristal Ford, DO       Or  . ondansetron (ZOFRAN) injection 4 mg  4 mg Intravenous Q6H PRN Mikhail, Velta Addison, DO      . potassium chloride SA (K-DUR,KLOR-CON) CR tablet 60 mEq  60 mEq Oral Once Cristal Ford, DO        Musculoskeletal: Strength & Muscle Tone: within normal limits Gait & Station: normal Patient leans: N/A  Psychiatric Specialty Exam: Physical Exam  Constitutional: He appears well-developed and well-nourished.  HENT:  Head: Normocephalic.  Neck: Normal range of motion.  Respiratory: Effort normal.  Musculoskeletal: Normal range of motion.  Neurological: He is alert.  Psychiatric: His mood appears anxious. His affect is labile. His speech is tangential. He is agitated, aggressive and actively hallucinating. Thought content is paranoid. Cognition and memory are impaired. He expresses impulsivity and inappropriate judgment.    Review of Systems  Psychiatric/Behavioral: Positive for hallucinations. The patient is nervous/anxious.   All other systems reviewed and are negative.   Blood pressure (!) 147/98, pulse (!) 58, temperature 98.3 F (36.8 C), temperature source Oral, resp. rate 18, SpO2 100 %.There is no height or weight on file to calculate BMI.  General Appearance: Casual  Eye Contact:  Fair  Speech:  Pressured  Volume:  Increased  Mood:  Anxious and Irritable  Affect:  Blunt  Thought Process:  Descriptions of Associations: Tangential  Orientation:  Other:  person  Thought Content:  Delusions, Hallucinations: Auditory Visual and Paranoid Ideation  Suicidal Thoughts:  No  Homicidal Thoughts:  No  Memory:  Immediate;   Poor Recent;   Poor Remote;   Poor  Judgement:  Impaired  Insight:  Lacking  Psychomotor Activity:  Increased  Concentration:  Concentration: Poor and Attention Span: Poor  Recall:  Poor  Fund of Knowledge:  Fair  Language:  Fair  Akathisia:  No  Handed:  Right  AIMS (if indicated):      Assets:  Leisure Time Physical Health Resilience Social Support  ADL's:  Impaired  Cognition:  Impaired,  Moderate  Sleep:        Treatment Plan Summary: Bipolar affective disorder, mania, severe with psychosis: -Start Lithium 300 mg TID for mood stabilization -Start Vistaril 50 mg TID for  agitation -Start Ativan 2 mg every two hours PRN agitation  Disposition: Supportive therapy provided about ongoing stressors.  Waylan Boga, NP 06/18/2017 4:03 PM

## 2017-06-19 DIAGNOSIS — F3163 Bipolar disorder, current episode mixed, severe, without psychotic features: Secondary | ICD-10-CM | POA: Diagnosis not present

## 2017-06-19 LAB — BASIC METABOLIC PANEL
Anion gap: 13 (ref 5–15)
BUN: 10 mg/dL (ref 6–20)
CHLORIDE: 109 mmol/L (ref 101–111)
CO2: 25 mmol/L (ref 22–32)
CREATININE: 0.9 mg/dL (ref 0.61–1.24)
Calcium: 8.9 mg/dL (ref 8.9–10.3)
GFR calc Af Amer: >60 mL/min (ref >60)
Glucose, Bld: 99 mg/dL (ref 65–99)
POTASSIUM: 3.2 mmol/L — AB (ref 3.5–5.1)
SODIUM: 147 mmol/L — AB (ref 135–145)

## 2017-06-19 LAB — CBC
HEMATOCRIT: 39.7 % (ref 39.0–52.0)
Hemoglobin: 12.9 g/dL — ABNORMAL LOW (ref 13.0–17.0)
MCH: 26.2 pg (ref 26.0–34.0)
MCHC: 32.5 g/dL (ref 30.0–36.0)
MCV: 80.7 fL (ref 78.0–100.0)
PLATELETS: 222 10*3/uL (ref 150–400)
RBC: 4.92 MIL/uL (ref 4.22–5.81)
RDW: 16.5 % — AB (ref 11.5–15.5)
WBC: 8.2 10*3/uL (ref 4.0–10.5)

## 2017-06-19 MED ORDER — POTASSIUM CHLORIDE CRYS ER 20 MEQ PO TBCR
40.0000 meq | EXTENDED_RELEASE_TABLET | Freq: Once | ORAL | Status: AC
  Administered 2017-06-19: 40 meq via ORAL
  Filled 2017-06-19: qty 2

## 2017-06-19 MED ORDER — LORAZEPAM 2 MG/ML IJ SOLN
1.0000 mg | Freq: Once | INTRAMUSCULAR | Status: AC
  Administered 2017-06-19: 1 mg via INTRAVENOUS
  Filled 2017-06-19: qty 1

## 2017-06-19 MED ORDER — ENSURE ENLIVE PO LIQD: 237.0000 mL | Freq: Two times a day (BID) | ORAL | 12 refills | Status: AC

## 2017-06-19 MED ORDER — HYDROXYZINE HCL 50 MG PO TABS: 50.0000 mg | ORAL_TABLET | Freq: Three times a day (TID) | ORAL | 0 refills | Status: DC | PRN

## 2017-06-19 NOTE — Plan of Care (Signed)
  Problem: Nutrition: Goal: Adequate nutrition will be maintained Outcome: Progressing   Problem: Elimination: Goal: Will not experience complications related to bowel motility Outcome: Progressing   Problem: Safety: Goal: Ability to remain free from injury will improve Outcome: Progressing   

## 2017-06-19 NOTE — Discharge Summary (Signed)
[facility] Discharge Summary   Patient: Brandon Fuentes UJW:[id number]   PCP: Patient, No Pcp Per DOB: 10-Sep-1990   Date of admission: 06/17/2017   Date of discharge:  06/19/2017    Discharge Diagnoses:  Principal Problem:   Bipolar disorder, curr episode mixed, severe, w/o psychotic features (HCC) Active Problems:   Psychosis (HCC)   Elevated LFTs   Dehydration   Hypernatremia   Increased anion gap metabolic acidosis   [facility]  Recommendations for Outpatient Follow-up:  1. Please follow-up with PCP in 1 week, patient will need a repeat BMP to ensure sodium level stable. 2. At some time patient will also require a repeat lithium level checked to ensure the levels are within therapeutic range  Follow-up Information    PCP. Schedule an appointment as soon as possible for a visit in 1 week(s).   Why:  need repeat sodium level in 1 week       psychiatry. Schedule an appointment as soon as possible for a visit.   Why:  [facility]          Diet recommendation: Low-sodium diet, encourage fluids  Activity: The patient is advised to gradually reintroduce usual activities.  Discharge Condition: good  Code Status: Full code  History of present illness: As per the H and P dictated on admission, " Brandon Fuentes is a 27 y.o. male with a medical history of bipolar disorder with a history of noncompliance.  Information in this history and physical were obtained from EDP as well as officers at bedside.  It seems that patient was in jail when he began having more altered mental status and psychosis due to noncompliance with his medications.  He has not been eating for the past several days and has not been taking his medications.  Patient has also been combative and noncooperative.  Currently he is singing and not responding to any questions."  Hospital Course:  Summary of his  active problems in the hospital is as following. Hypovolemic hypernatremia/hyperchloremia -Secondary to poor oral intake, dehydration -Upon admission, sodium 160, chloride 121 -Patient placed on IV fluids, currently sodium 147 -Patient had no neurological deficits on admission -Continue to monitor BMP in 1 week -Patient has been able to drink oral fluids and I suspect that the patient's sodium level should remain stable as long as he is able to hydrate himself orally.  No further IV fluids needed.  Acute kidney injury -Secondary to poor oral intake as well as dehydration -Baseline creatinine approximately 0.9-1.On admission, creatinine was 1.4 however creatinine rose to 3.44 Repeat creatinine is stable  SIRS -Presented with leukocytosis, WBC 12.1, as well as tachycardia -Suspect this is reactive not infectious as UA and chest x-ray unremarkable for infection. -WBC improved 11.4  Acute metabolic encephalopathy/bipolar disorder Acute psychosis -It seems that patient has not taken his medications recently and is currently manic -Policeofficers at bedside -Psychiatry consulted and recommend to continue Lamictal, lithium, Remeron and Atarax. -Patient in active psychosis with hallucination, psychiatric feels that continuing current regimen should help to resolve patient acute psychosis. Has reported history of neuroleptic malignant syndrome on antipsychotic medication and therefore psychiatry and family recommending not use this medication present.  Elevated anion gap metabolic acidosis -Admission, aniongap of 18, CO2 21-suspect secondary to starvation/ketosis -Anion gap normalized, CO2 24  Elevated AST -Suspect secondary to dehydrationvschronic -Upon review of patient's chart patient has hada mildlyelevated AST since 2011  Chronic anemia -Stable,  baseline hemoglobin between 12 and 13, currently 12.9   Insomnia -ContinueRemeron  All other chronic medical condition  were stable during the hospitalization.  Patient is currently being transferred to [facility] [place] hospital for continuing his care. On the day of the discharge the patient's vitals were stable , and no other acute medical condition were reported by patient. the patient was felt [facility].  Consultants: Psychiatry Procedures: none  DISCHARGE MEDICATION: Allergies as of 06/19/2017      Reactions   Atomoxetine Other (See Comments)   Increased agitation   Depakote [divalproex Sodium] Other (See Comments)   Sleepiness, anger   Geodon [ziprasidone Hcl] Other (See Comments)   Per mom pt get very agitated   Haldol [haloperidol]    Per mother--caused NMS neuroleptic malignant syndrome   Risperidone And Related    Per mother caused Neuroleptic malignant syndrome   Zoloft [sertraline Hcl]    anger   Olanzapine Other (See Comments)   NMS - neuroleptic malignant syndrome      Medication List    TAKE these medications   feeding supplement (ENSURE ENLIVE) Liqd Take 237 mLs by mouth 2 (two) times daily between meals.   hydrOXYzine 50 MG tablet Commonly known as:  ATARAX/VISTARIL Take 1 tablet (50 mg total) by mouth 3 (three) times daily as needed (agitation).   lamoTRIgine 25 MG tablet Commonly known as:  LAMICTAL Take 25 mg by mouth every evening.   lithium carbonate 300 MG capsule Take 300 mg by mouth 3 (three) times daily.   mirtazapine 15 MG tablet Commonly known as:  REMERON Take 7.5 mg by mouth every evening.   pantoprazole 40 MG tablet Commonly known as:  PROTONIX Take 40 mg by mouth daily.      Allergies  Allergen Reactions  . Atomoxetine Other (See Comments)    Increased agitation  . Depakote [Divalproex Sodium] Other (See Comments)    Sleepiness, anger  . Geodon [Ziprasidone Hcl] Other (See Comments)    Per mom pt get very agitated  . Haldol [Haloperidol]     Per mother--caused NMS neuroleptic malignant  syndrome  . Risperidone And Related     Per mother caused Neuroleptic malignant syndrome  . Zoloft [Sertraline Hcl]     anger  . Olanzapine Other (See Comments)    NMS - neuroleptic malignant syndrome   Discharge Instructions    Diet - low sodium heart healthy   Complete by:  As directed    Encourage fluids   Complete by:  As directed    Increase activity slowly   Complete by:  As directed      Discharge Exam: There were no vitals filed for this visit. Vitals:   06/18/17 2150 06/19/17 0704  BP: 138/90 (!) 152/101  Pulse: 72   Resp: 17 20  Temp: 97.8 F (36.6 C)   SpO2: 98% 100%   General: Appear in mild distress, no Rash; Oral Mucosa moist. Cardiovascular: S1 and S2 Present, no Murmur, no JVD Respiratory: Bilateral Air entry present and Clear to Auscultation, no Crackles, no wheezes Abdomen: Bowel Sound present, Soft and no tenderness Extremities: no Pedal edema, no calf tenderness Neurology: Grossly no focal neuro deficit. He has hallucination, remains combative and aggressive, verbally abusive.  The results of significant diagnostics from this hospitalization (including imaging, microbiology, ancillary and laboratory) are listed below for reference.    Significant Diagnostic Studies: US Renal  Result Date: 06/18/2017 CLINICAL DATA:  Acute  kidney injury EXAM: RENAL / URINARY TRACT ULTRASOUND COMPLETE COMPARISON:  None. FINDINGS: Right Kidney: Length: 11.0 cm. Echogenicity within normal limits. No mass or hydronephrosis visualized. Left Kidney: Length: 11.8 cm. Echogenicity within normal limits. No mass or hydronephrosis visualized. Bladder: Appears normal for degree of bladder distention. IMPRESSION: No acute findings.  No hydronephrosis. Electronically Signed   By: Charlett Nose M.D.   On: 06/18/2017 19:02   Dg Chest Port 1 View  Result Date: 06/17/2017 CLINICAL DATA:  Leukocytosis EXAM: PORTABLE CHEST 1 VIEW COMPARISON:  05/15/2017 chest radiograph. FINDINGS: Stable  cardiomediastinal silhouette with normal heart size. No pneumothorax. No pleural effusion. No pulmonary edema. No acute consolidative airspace disease. Hazy right lung base opacity described on prior chest radiograph is largely resolved. IMPRESSION: No acute cardiopulmonary disease. Previously described hazy right lung base opacity on 05/15/2017 chest radiographs appears largely resolved. Short-term follow-up PA and lateral chest radiographs are advised to document complete resolution. Electronically Signed   By: Delbert Phenix M.D.   On: 06/17/2017 19:30    Microbiology: Recent Results (from the past 240 hour(s))  MRSA PCR Screening     Status: Abnormal   Collection Time: 06/18/17  9:40 AM  Result Value Ref Range Status   MRSA by PCR POSITIVE (A) NEGATIVE Final    Comment:        The GeneXpert MRSA Assay (FDA approved for NASAL specimens only), is one component of a comprehensive MRSA colonization surveillance program. It is not intended to diagnose MRSA infection nor to guide or monitor treatment for MRSA infections. RESULT CALLED TO, READ BACK BY AND VERIFIED WITH: Hubbard Robinson [id number] @ 1414 BY J SCOTTON Performed at [facility], [address], [place], [place] [address]      Labs: CBC: Recent Labs  Lab 06/17/17 1214 06/18/17 1129 06/19/17 0800  WBC 12.1* 11.4* 8.2  NEUTROABS 7.3  --   --   HGB 12.2* 12.9* 12.9*  HCT 37.2* 38.7* 39.7  MCV 81.6 79.6 80.7  PLT 226 236 222   Basic Metabolic Panel: Recent Labs  Lab 06/18/17 0009 06/18/17 0332 06/18/17 1129 06/18/17 1425 06/19/17 0800  NA 150* 147* 147* 147* 147*  K 4.7 5.5* 3.0* 3.8 3.2*  CL 115* 113* 111 111 109  CO2 GLUCOSE 121* 114* 117* 96 99  BUN CREATININE 3.31* 3.44* 1.03 1.05 0.90  CALCIUM 9.0 9.0 8.8* 8.3* 8.9   Liver Function Tests: Recent Labs  Lab 06/17/17 1214 06/18/17 1129  AST 61* 44*  ALT 30 28  ALKPHOS 57 57  BILITOT 1.8* 1.9*  PROT  7.3 7.2  ALBUMIN 3.9 3.7   Time spent: 35 minutes  Signed:  Lynden Oxford  [facility]  06/19/2017  , 11:09 AM

## 2017-06-19 NOTE — Progress Notes (Signed)
LCSW consulted for placement. Patient from [facility] jail.   PLAN: patient will return to jail, unless the court deem otherwise.   Patient cleared by Psych. LCSW signing off. No CSW needs.    Beulah Gandy [facility] CSW [phone]

## 2017-06-19 NOTE — Progress Notes (Signed)
06/19/17  Patient ate for breakfast:  Choc carination instant breakfast, [place] Toast, three strips bacon, applesauce, and cranberry juice.  Lunch:  [place] fries, orange juice, [place] vanilla carination instant breakfast. Pt also had 2 ensures.

## 2018-10-08 IMAGING — CR DG CHEST 2V
2 series · 2 of 2 positions shown · non-contrast
Comparison: 05/08/2017

CLINICAL DATA: Pleuritic chest pain

EXAM:
CHEST - 2 VIEW

[chest pa]
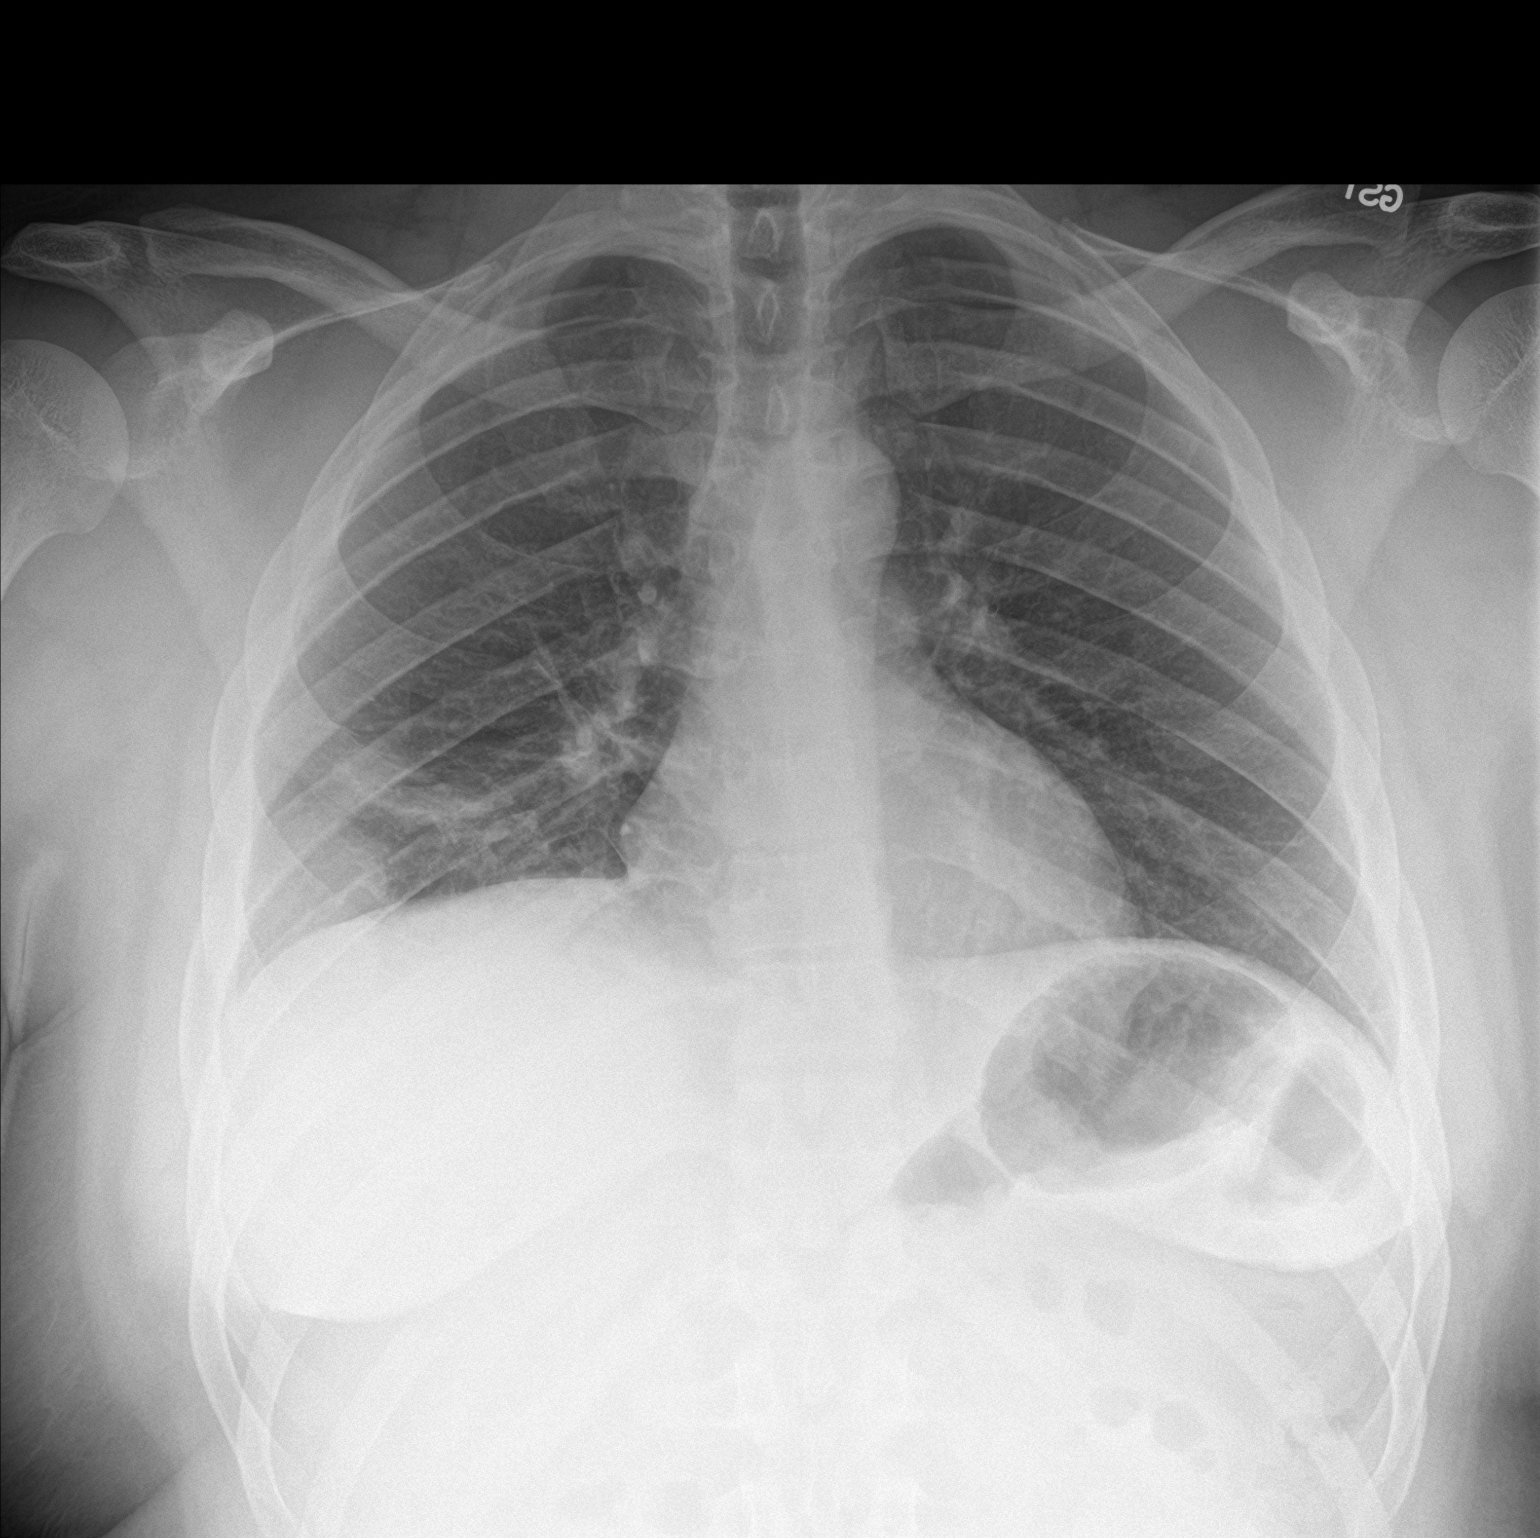

[chest lat]
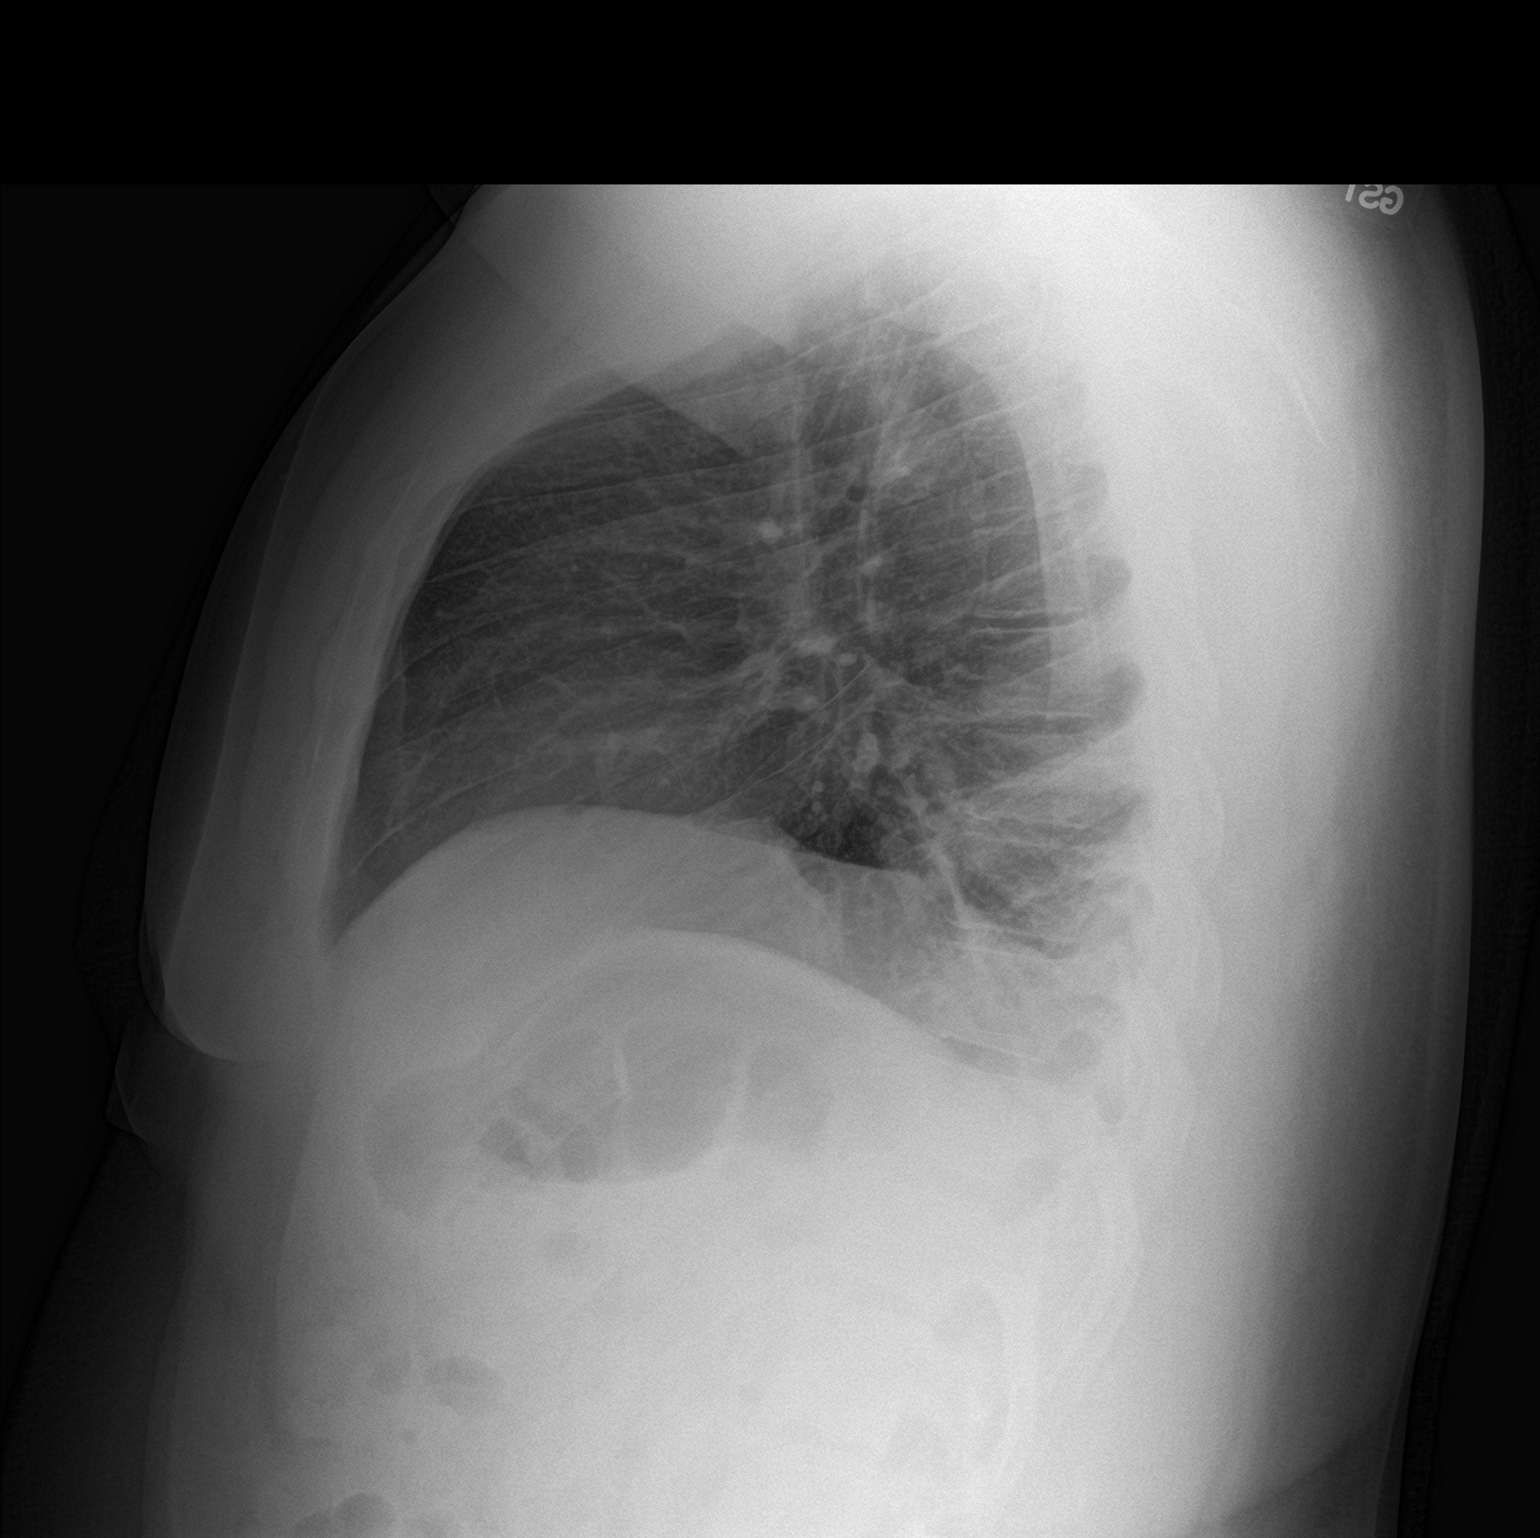

[2 of 2 positions shown; findings below may reference images not displayed]

FINDINGS: Heart size is normal. The mediastinum is normal. Left chest is
clear. There is right lower lobe infiltrate posteriorly consistent
with pneumonia. Upper lungs are clear.
IMPRESSION: Posterior right lower lobe pneumonia.

## 2019-03-29 ENCOUNTER — Encounter (HOSPITAL_COMMUNITY): Payer: Self-pay

## 2019-03-29 ENCOUNTER — Other Ambulatory Visit: Payer: Self-pay

## 2019-03-29 ENCOUNTER — Emergency Department (HOSPITAL_COMMUNITY)
Admission: EM | Admit: 2019-03-29 | Discharge: 2019-03-30 | Disposition: A | Discharge status: Home / Self Care | Payer: Medicare Other | Attending: Emergency Medicine | Admitting: Emergency Medicine

## 2019-03-29 DIAGNOSIS — F319 Bipolar disorder, unspecified: Secondary | ICD-10-CM

## 2019-03-29 DIAGNOSIS — Z79899 Other long term (current) drug therapy: Secondary | ICD-10-CM | POA: Insufficient documentation

## 2019-03-29 DIAGNOSIS — Z0489 Encounter for examination and observation for other specified reasons: Secondary | ICD-10-CM | POA: Diagnosis present

## 2019-03-29 LAB — COMPREHENSIVE METABOLIC PANEL
ALT: 88 U/L — ABNORMAL HIGH (ref 0–44)
AST: 55 U/L — ABNORMAL HIGH (ref 15–41)
Albumin: 3.8 g/dL (ref 3.5–5.0)
Alkaline Phosphatase: 71 U/L (ref 38–126)
Anion gap: 7 (ref 5–15)
BUN: 13 mg/dL (ref 6–20)
CO2: 26 mmol/L (ref 22–32)
Calcium: 8.7 mg/dL — ABNORMAL LOW (ref 8.9–10.3)
Chloride: 105 mmol/L (ref 98–111)
Creatinine, Ser: 1.01 mg/dL (ref 0.61–1.24)
GFR calc Af Amer: >60 mL/min (ref >60)
GFR calc non Af Amer: >60 mL/min (ref >60)
Glucose, Bld: 113 mg/dL — ABNORMAL HIGH (ref 70–99)
Potassium: 3.6 mmol/L (ref 3.5–5.1)
Sodium: 138 mmol/L (ref 135–145)
Total Bilirubin: 0.5 mg/dL (ref 0.3–1.2)
Total Protein: 7.4 g/dL (ref 6.5–8.1)

## 2019-03-29 LAB — RAPID URINE DRUG SCREEN, HOSP PERFORMED
Amphetamines: NOT DETECTED
Barbiturates: NOT DETECTED
Benzodiazepines: NOT DETECTED
Cocaine: NOT DETECTED
Opiates: NOT DETECTED
Tetrahydrocannabinol: NOT DETECTED

## 2019-03-29 LAB — CBC
HCT: 39.2 % (ref 39.0–52.0)
Hemoglobin: 12.7 g/dL — ABNORMAL LOW (ref 13.0–17.0)
MCH: 26.9 pg (ref 26.0–34.0)
MCHC: 32.4 g/dL (ref 30.0–36.0)
MCV: 83.1 fL (ref 80.0–100.0)
Platelets: 235 10*3/uL (ref 150–400)
RBC: 4.72 MIL/uL (ref 4.22–5.81)
RDW: 13.7 % (ref 11.5–15.5)
WBC: 10.1 10*3/uL (ref 4.0–10.5)
nRBC: 0 % (ref 0.0–0.2)

## 2019-03-29 LAB — ETHANOL: Alcohol, Ethyl (B): <10 mg/dL (ref <10)

## 2019-03-29 NOTE — ED Provider Notes (Signed)
[facility]-EMERGENCY DEPT Provider Note   CSN: [id number] Arrival date & time: 03/29/19  2155     History Chief Complaint  Patient presents with  . Medical Clearance    Brandon Fuentes is a 29 y.o. male.  The history is provided by the patient.  Mental Health Problem Presenting symptoms: no aggressive behavior, no agitation, no bizarre behavior, no delusions, no depression, no disorganized speech, no disorganized thought process, no hallucinations, no homicidal ideas, no paranoid behavior, no self-mutilation, no suicidal thoughts, no suicidal threats and no suicide attempt   Patient accompanied by: none. Degree of incapacity (severity):  Mild Onset quality:  Gradual Timing:  Constant Progression:  Unchanged Associated symptoms: no abdominal pain and no chest pain        Past Medical History:  Diagnosis Date  . Bipolar 1 disorder [facility])     Patient Active Problem List   Diagnosis Date Noted  . Dehydration 06/17/2017  . Hypernatremia 06/17/2017  . Increased anion gap metabolic acidosis 06/17/2017  . Rhabdomyolysis 05/15/2017  . Pneumomediastinum (HCC)   . Pneumopericardium   . Elevated LFTs   . Bipolar disorder, curr episode mixed, severe, w/o psychotic features (HCC) 04/10/2016  . Bipolar I disorder, current or most recent episode manic, severe (HCC) 04/01/2016  . 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 Psychosis (HCC) 04/01/2016    History reviewed. No pertinent surgical history.     Family History  Problem Relation Age of Onset  . Diabetes Mother     Social History   Tobacco Use  . Smoking status: Never Smoker  . Smokeless tobacco: Never Used  Substance Use Topics  . Alcohol use: No  . Drug use: No    Home Medications Prior to Admission medications   Medication Sig Start Date End Date Taking? Authorizing Provider  feeding supplement, ENSURE ENLIVE, (ENSURE ENLIVE) LIQD Take 237 mLs by mouth 2 (two) times daily between meals. 06/19/17    Rolly Salter, MD  hydrOXYzine (ATARAX/VISTARIL) 50 MG tablet Take 1 tablet (50 mg total) by mouth 3 (three) times daily as needed (agitation). 06/19/17   Rolly Salter, MD  lamoTRIgine (LAMICTAL) 25 MG tablet Take 25 mg by mouth every evening.    [provider]  lithium carbonate 300 MG capsule Take 300 mg by mouth 3 (three) times daily. 06/10/14   [provider]  mirtazapine (REMERON) 15 MG tablet Take 7.5 mg by mouth every evening.    [provider]  pantoprazole (PROTONIX) 40 MG tablet Take 40 mg by mouth daily.    [provider]    Allergies    Atomoxetine, Depakote [divalproex sodium], Geodon [ziprasidone hcl], Haldol [haloperidol], Risperidone and related, Zoloft [sertraline hcl], and Olanzapine  Review of Systems   Review of Systems  Constitutional: Negative for fever.  HENT: Negative for congestion.   Eyes: Negative for visual disturbance.  Respiratory: Negative for cough.   Cardiovascular: Negative for chest pain.  Gastrointestinal: Negative for abdominal pain.  Genitourinary: Negative for difficulty urinating.  Musculoskeletal: Negative for arthralgias.  Neurological: Negative for dizziness.  Psychiatric/Behavioral: Negative for agitation, hallucinations, homicidal ideas, paranoia, self-injury and suicidal ideas.  All other systems reviewed and are negative.   Physical Exam Updated Vital Signs BP 134/75   Pulse 100   Temp 98.5 F (36.9 C) (Oral)   Resp 17   SpO2 96%   Physical Exam Vitals and nursing note reviewed.  Constitutional:      General: He is not in acute distress.  Appearance: Normal appearance.  HENT:     Head: Normocephalic and atraumatic.     Nose: Nose normal.  Eyes:     Conjunctiva/sclera: Conjunctivae normal.     Pupils: Pupils are equal, round, and reactive to light.  Cardiovascular:     Rate and Rhythm: Normal rate and regular rhythm.     Pulses: Normal pulses.     Heart sounds: Normal heart  sounds.  Pulmonary:     Effort: Pulmonary effort is normal.     Breath sounds: Normal breath sounds.  Abdominal:     General: Abdomen is flat. Bowel sounds are normal.     Tenderness: There is no abdominal tenderness. There is no guarding.  Musculoskeletal:        General: Normal range of motion.     Cervical back: Normal range of motion and neck supple.  Skin:    General: Skin is warm and dry.     Capillary Refill: Capillary refill takes less than 2 seconds.  Neurological:     General: No focal deficit present.     Mental Status: He is alert and oriented to person, place, and time.     Deep Tendon Reflexes: Reflexes normal.  Psychiatric:        Mood and Affect: Mood normal.        Behavior: Behavior normal.     ED Results / Procedures / Treatments   Labs (all labs ordered are listed, but only abnormal results are displayed) Results for orders placed or performed during the hospital encounter of 03/29/19  Comprehensive metabolic panel  Result Value Ref Range   Sodium 138 135 - 145 mmol/L   Potassium 3.6 3.5 - 5.1 mmol/L   Chloride 105 98 - 111 mmol/L   CO2 26 22 - 32 mmol/L   Glucose, Bld 113 (H) 70 - 99 mg/dL   BUN 13 6 - 20 mg/dL   Creatinine, Ser 1.01 0.61 - 1.24 mg/dL   Calcium 8.7 (L) 8.9 - 10.3 mg/dL   Total Protein 7.4 6.5 - 8.1 g/dL   Albumin 3.8 3.5 - 5.0 g/dL   AST 55 (H) 15 - 41 U/L   ALT 88 (H) 0 - 44 U/L   Alkaline Phosphatase 71 38 - 126 U/L   Total Bilirubin 0.5 0.3 - 1.2 mg/dL   GFR calc non Af Amer >60 >60 mL/min   GFR calc Af Amer >60 >60 mL/min   Anion gap 7 5 - 15  Ethanol  Result Value Ref Range   Alcohol, Ethyl (B) <10 <10 mg/dL  cbc  Result Value Ref Range   WBC 10.1 4.0 - 10.5 K/uL   RBC 4.72 4.22 - 5.81 MIL/uL   Hemoglobin 12.7 (L) 13.0 - 17.0 g/dL   HCT 39.2 39.0 - 52.0 %   MCV 83.1 80.0 - 100.0 fL   MCH 26.9 26.0 - 34.0 pg   MCHC 32.4 30.0 - 36.0 g/dL   RDW 13.7 11.5 - 15.5 %   Platelets 235 150 - 400 K/uL   nRBC 0.0 0.0 - 0.2 %    Rapid urine drug screen (hospital performed)  Result Value Ref Range   Opiates NONE DETECTED NONE DETECTED   Cocaine NONE DETECTED NONE DETECTED   Benzodiazepines NONE DETECTED NONE DETECTED   Amphetamines NONE DETECTED NONE DETECTED   Tetrahydrocannabinol NONE DETECTED NONE DETECTED   Barbiturates NONE DETECTED NONE DETECTED   No results found. ' Radiology No results found.  Procedures Procedures (including critical care  time)  Medications Ordered in ED Medications - No data to display  ED Course  I have reviewed the triage vital signs and the nursing notes.  Pertinent labs & imaging results that were available during my care of the patient were reviewed by me and considered in my medical decision making (see chart for details).    Was just at [facility] today and left because he did not want to see anyone there.  Patient is neither SI nor HI no AH nor VH.  Medically cleared by me for TTS.    TTS concurs no criteria for admission.  Patient is taking his medications  Final Clinical Impression(s) / ED Diagnoses  Return for weakness, numbness, changes in vision or speech, fevers >100.4 unrelieved by medication, shortness of breath, intractable vomiting, or diarrhea, abdominal pain, Inability to tolerate liquids or food, cough, altered mental status or any concerns. No signs of systemic illness or infection. The patient is nontoxic-appearing on exam and vital signs are within normal limits.   I have reviewed the triage vital signs and the nursing notes. Pertinent labs &imaging results that were available during my care of the patient were reviewed by me and considered in my medical decision making (see chart for details).  After history, exam, and medical workup I feel the patient has been appropriately medically screened and is safe for discharge home. Pertinent diagnoses were discussed with the patient. Patient was given return precautions   Trenna Kiely, MD 03/30/19  0160

## 2019-03-29 NOTE — ED Triage Notes (Signed)
Pt states he wants to be voluntarily committed. Pt states he is bipolar. Pt states family said he needed to be admitted to hospital and placed with behavioral health due to being bipolar. Pt denies HI, SI.

## 2019-03-30 ENCOUNTER — Encounter (HOSPITAL_COMMUNITY): Payer: Self-pay | Admitting: Emergency Medicine

## 2019-03-30 DIAGNOSIS — F319 Bipolar disorder, unspecified: Secondary | ICD-10-CM | POA: Diagnosis not present

## 2019-03-30 LAB — LITHIUM LEVEL: Lithium Lvl: 0.52 mmol/L — ABNORMAL LOW (ref 0.60–1.20)

## 2019-03-30 NOTE — BH Assessment (Signed)
Tele Assessment Note   Patient Name: Brandon Fuentes MRN: [id number] Referring Physician: Cy Blamer, MD Location of Patient: [facility] Location of Provider: Behavioral Health TTS Department  Brandon Fuentes is an 30 y.o. male who presents to the ED voluntarily. Pt reports he came to the ED because he needs to be in a stable place. Pt states his mom told him that he cannot come home. Pt reports he was d/c from [place] 5 days ago. Pt states he was with his ex-girlfriend for the past 5 days but they broke up and now he has nowhere to go. Pt denies SI, HI, and AVH. Pt states he receives disability and his mother is his payee. Pt states he wants to be in control of his own money. Pt smiles and laughs throughout the assessment. 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   TTS spoke with the pt's mother with his consent. Mom reports she wants the pt to be admitted because "he is not well." TTS asked the pt's mother to disclose details regarding the pt's presenting concerns and she states "he is just not well, he is not being himself. He's talkative and cursing a lot. I just not he's not acting like himself." Mom reports the pt cannot return to her home until he is "better." When asked what would "better" look like for her, she states "just being himself, he's just been very talkative and he is Bipolar, he is not well." Mom denies that pt has reported any SI, HI, or AVH to her. Mom states pt has been acting strange because he drove to [place] today to check himself into a hospital and was texting her multiple times while she was at work.   Brandon Murdoch, NP states the pt does not meet criteria for inpt tx. EDP Palumbo, April, MD and Brandon Brass, RN have been advised. Pt is recommended to follow up with his current MH provider.  Diagnosis: Bipolar d/o, hypomanic episode  Past Medical History:  Past  Medical History:  Diagnosis Date  . Bipolar 1 disorder (HCC)     History reviewed. No pertinent surgical history.  Family History:  Family History  Problem Relation Age of Onset  . Diabetes Mother     Social History:  reports that he has never smoked. He has never used smokeless tobacco. He reports that he does not drink alcohol or use drugs.  Additional Social History:  Alcohol / Drug Use Pain Medications: See MAR Prescriptions: See MAR Over the Counter: See MAR History of alcohol / drug use?: No history of alcohol / drug abuse  CIWA: CIWA-Ar BP: 137/68 Pulse Rate: 66 COWS:    Allergies:  Allergies  Allergen Reactions  . Atomoxetine Other (See Comments)    Increased agitation  . Depakote [Divalproex Sodium] Other (See Comments)    Sleepiness, anger  . Geodon [Ziprasidone Hcl] Other (See Comments)    Per mom pt get very agitated  . 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 Risperidone And Related     Per mother caused Neuroleptic malignant syndrome  . Zoloft [Sertraline Hcl]     anger  . Olanzapine Other (See Comments)    NMS - neuroleptic malignant syndrome    Home Medications: (Not in a hospital admission)   OB/GYN Status:  No LMP for male patient.  General Assessment  Data Assessment unable to be completed: Yes Reason for not completing assessment: TTS calling nurse and ED but no answer Location of Assessment: [facility] TTS Assessment: In system Is this a Tele or Face-to-Face Assessment?: Tele Assessment Is this an Initial Assessment or a Re-assessment for this encounter?: Initial Assessment Patient Accompanied by:: N/A Language Other than English: No Living Arrangements: Homeless/Shelter What gender do you identify as?: Male Marital status: Single Pregnancy Status: No Living Arrangements: Alone Can pt return to current living arrangement?: Yes Admission Status: Voluntary Is patient capable of signing voluntary  admission?: Yes Referral Source: Self/Family/Friend Insurance type: MCD     Crisis Care Plan Living Arrangements: Alone Name of Psychiatrist: ACTT team, unsure of name Name of Therapist: ACTT team, unsure of name  Education Status Is patient currently in school?: No Is the patient employed, unemployed or receiving disability?: Receiving disability income  Risk to self with the past 6 months Suicidal Ideation: No Has patient been a risk to self within the past 6 months prior to admission? : No Suicidal Intent: No Has patient had any suicidal intent within the past 6 months prior to admission? : No Is patient at risk for suicide?: No Suicidal Plan?: No Has patient had any suicidal plan within the past 6 months prior to admission? : No Access to Means: No What has been your use of drugs/alcohol within the last 12 months?: denies Previous Attempts/Gestures: No Triggers for Past Attempts: None known Intentional Self Injurious Behavior: None Family Suicide History: No Recent stressful life event(s): Other (Comment)(broke up with girlfriend) Persecutory voices/beliefs?: No Depression: No Substance abuse history and/or treatment for substance abuse?: No Suicide prevention information given to non-admitted patients: Not applicable  Risk to Others within the past 6 months Homicidal Ideation: No Does patient have any lifetime risk of violence toward others beyond the six months prior to admission? : No Thoughts of Harm to Others: No Current Homicidal Intent: No Current Homicidal Plan: No Access to Homicidal Means: No History of harm to others?: No Assessment of Violence: None Noted Does patient have access to weapons?: No Criminal Charges Pending?: No Does patient have a court date: No Is patient on probation?: No  Psychosis Hallucinations: None noted Delusions: None noted  Mental Status Report Appearance/Hygiene: Unremarkable Eye Contact: Good Motor Activity: Freedom of  movement Speech: Logical/coherent Level of Consciousness: Alert Mood: Euthymic Affect: Appropriate to circumstance Anxiety Level: None Thought Processes: Relevant, Coherent Judgement: Unimpaired Orientation: Person, Place, Time, Situation, Appropriate for developmental age Obsessive Compulsive Thoughts/Behaviors: None  Cognitive Functioning Concentration: Normal Memory: Remote Intact, Recent Intact Is patient IDD: No Insight: Fair Impulse Control: Good Appetite: Good Have you had any weight changes? : No Change Sleep: No Change Total Hours of Sleep: 7 Vegetative Symptoms: None  ADLScreening [facility] Assessment Services) Patient's cognitive ability adequate to safely complete daily activities?: Yes Patient able to express need for assistance with ADLs?: Yes Independently performs ADLs?: Yes (appropriate for developmental age)  Prior Inpatient Therapy Prior Inpatient Therapy: Yes Prior Therapy Dates: 2021 and mult admissions Prior Therapy Facilty/Provider(s): [facility], [facility] Reason for Treatment: BIPOLAR  Prior Outpatient Therapy Prior Outpatient Therapy: Yes Prior Therapy Dates: ONGOING Prior Therapy Facilty/Provider(s): MOM AND PT DO NOT KNOW THE NAME Reason for Treatment: BIPOLAR Does patient have an ACCT team?: Yes(first appt is pending) Does patient have Intensive In-House Services?  : No Does patient have [facility] services? : No Does patient have P4CC services?: No  ADL Screening (condition at time of admission)  Patient's cognitive ability adequate to safely complete daily activities?: Yes Is the patient deaf or have difficulty hearing?: No Does the patient have difficulty seeing, even when wearing glasses/contacts?: No Does the patient have difficulty concentrating, remembering, or making decisions?: No Patient able to express need for assistance with ADLs?: Yes Does the patient have difficulty dressing or bathing?: No Independently performs ADLs?: Yes  (appropriate for developmental age) Does the patient have difficulty walking or climbing stairs?: No Weakness of Legs: None Weakness of Arms/Hands: None  Home Assistive Devices/Equipment Home Assistive Devices/Equipment: Eyeglasses    Abuse/Neglect Assessment (Assessment to be complete while patient is alone) Abuse/Neglect Assessment Can Be Completed: Yes Physical Abuse: Denies Verbal Abuse: Denies Sexual Abuse: Denies Exploitation of patient/patient's resources: Denies Self-Neglect: Denies     Merchant navy officer (For Healthcare) Does Patient Have a Medical Advance Directive?: No Would patient like information on creating a medical advance directive?: No - Patient declined          Disposition: Brandon Murdoch, NP states the pt does not meet criteria for inpt tx. EDP Palumbo, April, MD and Brandon Brass, RN have been advised. Pt is recommended to follow up with his current MH provider. Disposition Initial Assessment Completed for this Encounter: Yes Disposition of Patient: Discharge Patient refused recommended treatment: No Mode of transportation if patient is discharged/movement?: Walking  This service was provided via telemedicine using a 2-way, interactive audio and video technology.  Names of all persons participating in this telemedicine service and their role in this encounter. Name: Lise Auer Role: Patient  Name: Berenson,Sandra Role: Mom  Name: Princess Bruins Role: TTS  Name: Brandon Murdoch, NP Role: [facility] Provider    Karolee Ohs 03/30/2019 1:59 AM

## 2019-03-30 NOTE — Progress Notes (Signed)
Brandon Murdoch, NP states the pt does not meet criteria for inpt tx. EDP Palumbo, April, MD and Weston Brass, RN have been advised. Pt is recommended to follow up with his current MH provider.

## 2019-09-10 ENCOUNTER — Ambulatory Visit: Payer: Self-pay | Admitting: Internal Medicine

## 2020-03-13 DIAGNOSIS — E876 Hypokalemia: Secondary | ICD-10-CM | POA: Diagnosis not present

## 2020-03-13 DIAGNOSIS — I10 Essential (primary) hypertension: Secondary | ICD-10-CM | POA: Diagnosis not present

## 2020-03-13 DIAGNOSIS — M6282 Rhabdomyolysis: Secondary | ICD-10-CM | POA: Diagnosis not present

## 2020-03-13 DIAGNOSIS — Z9119 Patient's noncompliance with other medical treatment and regimen: Secondary | ICD-10-CM | POA: Diagnosis not present

## 2020-03-13 DIAGNOSIS — Z79899 Other long term (current) drug therapy: Secondary | ICD-10-CM | POA: Diagnosis not present

## 2020-03-13 DIAGNOSIS — R454 Irritability and anger: Secondary | ICD-10-CM | POA: Diagnosis not present

## 2020-03-13 DIAGNOSIS — I4581 Long QT syndrome: Secondary | ICD-10-CM | POA: Diagnosis not present

## 2020-03-13 DIAGNOSIS — G210 Malignant neuroleptic syndrome: Secondary | ICD-10-CM | POA: Diagnosis not present

## 2020-03-13 DIAGNOSIS — D72829 Elevated white blood cell count, unspecified: Secondary | ICD-10-CM | POA: Diagnosis not present

## 2020-03-13 DIAGNOSIS — R7401 Elevation of levels of liver transaminase levels: Secondary | ICD-10-CM | POA: Diagnosis not present

## 2020-03-13 DIAGNOSIS — Z20822 Contact with and (suspected) exposure to covid-19: Secondary | ICD-10-CM | POA: Diagnosis not present

## 2020-03-13 DIAGNOSIS — J45909 Unspecified asthma, uncomplicated: Secondary | ICD-10-CM | POA: Diagnosis not present

## 2020-03-13 DIAGNOSIS — K219 Gastro-esophageal reflux disease without esophagitis: Secondary | ICD-10-CM | POA: Diagnosis not present

## 2020-03-17 DIAGNOSIS — I10 Essential (primary) hypertension: Secondary | ICD-10-CM | POA: Insufficient documentation

## 2020-03-21 DIAGNOSIS — R932 Abnormal findings on diagnostic imaging of liver and biliary tract: Secondary | ICD-10-CM | POA: Diagnosis not present

## 2020-03-21 DIAGNOSIS — R7401 Elevation of levels of liver transaminase levels: Secondary | ICD-10-CM | POA: Diagnosis not present

## 2020-05-30 DIAGNOSIS — Z6841 Body Mass Index (BMI) 40.0 and over, adult: Secondary | ICD-10-CM

## 2020-06-26 ENCOUNTER — Emergency Department (HOSPITAL_COMMUNITY)
Admission: EM | Admit: 2020-06-26 | Discharge: 2020-07-01 | Disposition: A | Discharge status: Other Acute Inpatient Hospital | Payer: Medicare Other | Attending: Emergency Medicine | Admitting: Emergency Medicine

## 2020-06-26 ENCOUNTER — Encounter (HOSPITAL_COMMUNITY): Payer: Self-pay | Admitting: Emergency Medicine

## 2020-06-26 ENCOUNTER — Other Ambulatory Visit: Payer: Self-pay

## 2020-06-26 DIAGNOSIS — F3163 Bipolar disorder, current episode mixed, severe, without psychotic features: Secondary | ICD-10-CM | POA: Diagnosis not present

## 2020-06-26 DIAGNOSIS — F22 Delusional disorders: Secondary | ICD-10-CM | POA: Insufficient documentation

## 2020-06-26 DIAGNOSIS — F309 Manic episode, unspecified: Secondary | ICD-10-CM

## 2020-06-26 DIAGNOSIS — R000 Tachycardia, unspecified: Secondary | ICD-10-CM | POA: Insufficient documentation

## 2020-06-26 DIAGNOSIS — Z79899 Other long term (current) drug therapy: Secondary | ICD-10-CM | POA: Diagnosis not present

## 2020-06-26 DIAGNOSIS — Z20822 Contact with and (suspected) exposure to covid-19: Secondary | ICD-10-CM | POA: Insufficient documentation

## 2020-06-26 DIAGNOSIS — Z046 Encounter for general psychiatric examination, requested by authority: Secondary | ICD-10-CM | POA: Diagnosis present

## 2020-06-26 DIAGNOSIS — F3113 Bipolar disorder, current episode manic without psychotic features, severe: Secondary | ICD-10-CM | POA: Diagnosis present

## 2020-06-26 NOTE — ED Triage Notes (Signed)
Patient brought in by [employer] voluntary. Patient was "terrorizing" his apartment complex, acting bizarre and erratic. Patient has been diagnosed bipolar and is on lithium per his mother. Mother states medications aren't working. Patient noted to be tangential, pressured speech, and loud. Patient easily agitated. Patient unable to properly answer questions.

## 2020-06-27 DIAGNOSIS — F3163 Bipolar disorder, current episode mixed, severe, without psychotic features: Secondary | ICD-10-CM | POA: Diagnosis not present

## 2020-06-27 LAB — CBC WITH DIFFERENTIAL/PLATELET
Abs Immature Granulocytes: 0.05 10*3/uL (ref 0.00–0.07)
Basophils Absolute: 0 10*3/uL (ref 0.0–0.1)
Basophils Relative: 0 %
Eosinophils Absolute: 0.3 10*3/uL (ref 0.0–0.5)
Eosinophils Relative: 2 %
HCT: 40.8 % (ref 39.0–52.0)
Hemoglobin: 12.8 g/dL — ABNORMAL LOW (ref 13.0–17.0)
Immature Granulocytes: 0 %
Lymphocytes Relative: 24 %
Lymphs Abs: 3.3 10*3/uL (ref 0.7–4.0)
MCH: 26.8 pg (ref 26.0–34.0)
MCHC: 31.4 g/dL (ref 30.0–36.0)
MCV: 85.5 fL (ref 80.0–100.0)
Monocytes Absolute: 1.4 10*3/uL — ABNORMAL HIGH (ref 0.1–1.0)
Monocytes Relative: 10 %
Neutro Abs: 8.6 10*3/uL — ABNORMAL HIGH (ref 1.7–7.7)
Neutrophils Relative %: 64 %
Platelets: 170 10*3/uL (ref 150–400)
RBC: 4.77 MIL/uL (ref 4.22–5.81)
RDW: 14.8 % (ref 11.5–15.5)
WBC: 13.6 10*3/uL — ABNORMAL HIGH (ref 4.0–10.5)
nRBC: 0 % (ref 0.0–0.2)

## 2020-06-27 LAB — COMPREHENSIVE METABOLIC PANEL
ALT: 32 U/L (ref 0–44)
AST: 36 U/L (ref 15–41)
Albumin: 3.9 g/dL (ref 3.5–5.0)
Alkaline Phosphatase: 50 U/L (ref 38–126)
Anion gap: 9 (ref 5–15)
BUN: 11 mg/dL (ref 6–20)
CO2: 26 mmol/L (ref 22–32)
Calcium: 8.7 mg/dL — ABNORMAL LOW (ref 8.9–10.3)
Chloride: 105 mmol/L (ref 98–111)
Creatinine, Ser: 1.23 mg/dL (ref 0.61–1.24)
GFR, Estimated: >60 mL/min (ref >60)
Glucose, Bld: 116 mg/dL — ABNORMAL HIGH (ref 70–99)
Potassium: 3.3 mmol/L — ABNORMAL LOW (ref 3.5–5.1)
Sodium: 140 mmol/L (ref 135–145)
Total Bilirubin: 0.3 mg/dL (ref 0.3–1.2)
Total Protein: 7.7 g/dL (ref 6.5–8.1)

## 2020-06-27 LAB — RESP PANEL BY RT-PCR (FLU A&B, COVID) ARPGX2
Influenza A by PCR: NEGATIVE
Influenza B by PCR: NEGATIVE
SARS Coronavirus 2 by RT PCR: NEGATIVE

## 2020-06-27 LAB — CK: Total CK: 1147 U/L — ABNORMAL HIGH (ref 49–397)

## 2020-06-27 LAB — SALICYLATE LEVEL: Salicylate Lvl: <7 mg/dL — ABNORMAL LOW (ref 7.0–30.0)

## 2020-06-27 LAB — RAPID URINE DRUG SCREEN, HOSP PERFORMED
Amphetamines: NOT DETECTED
Barbiturates: NOT DETECTED
Benzodiazepines: NOT DETECTED
Cocaine: NOT DETECTED
Opiates: NOT DETECTED
Tetrahydrocannabinol: NOT DETECTED

## 2020-06-27 LAB — ACETAMINOPHEN LEVEL: Acetaminophen (Tylenol), Serum: <10 ug/mL — ABNORMAL LOW (ref 10–30)

## 2020-06-27 LAB — LITHIUM LEVEL: Lithium Lvl: 0.55 mmol/L — ABNORMAL LOW (ref 0.60–1.20)

## 2020-06-27 LAB — ETHANOL: Alcohol, Ethyl (B): <10 mg/dL (ref <10)

## 2020-06-27 MED ORDER — DROPERIDOL 2.5 MG/ML IJ SOLN
5.0000 mg | Freq: Once | INTRAMUSCULAR | Status: AC
  Administered 2020-06-27: 5 mg via INTRAVENOUS
  Filled 2020-06-27: qty 2

## 2020-06-27 MED ORDER — PANTOPRAZOLE SODIUM 40 MG PO TBEC
40.0000 mg | DELAYED_RELEASE_TABLET | Freq: Every day | ORAL | Status: DC
  Administered 2020-06-27 – 2020-07-01 (×5): 40 mg via ORAL
  Filled 2020-06-27 (×5): qty 1

## 2020-06-27 MED ORDER — ALUM & MAG HYDROXIDE-SIMETH 200-200-20 MG/5ML PO SUSP: 30.0000 mL | Freq: Four times a day (QID) | ORAL | Status: DC | PRN

## 2020-06-27 MED ORDER — STERILE WATER FOR INJECTION IJ SOLN
INTRAMUSCULAR | Status: AC
  Filled 2020-06-27: qty 10

## 2020-06-27 MED ORDER — ONDANSETRON HCL 4 MG PO TABS: 4.0000 mg | ORAL_TABLET | Freq: Three times a day (TID) | ORAL | Status: DC | PRN

## 2020-06-27 MED ORDER — ZIPRASIDONE MESYLATE 20 MG IM SOLR
INTRAMUSCULAR | Status: AC
  Filled 2020-06-27: qty 20

## 2020-06-27 MED ORDER — LORAZEPAM 2 MG/ML IJ SOLN
2.0000 mg | INTRAMUSCULAR | Status: DC | PRN
  Administered 2020-06-27 – 2020-06-30 (×7): 2 mg via INTRAMUSCULAR
  Filled 2020-06-27 (×8): qty 1

## 2020-06-27 MED ORDER — ZIPRASIDONE MESYLATE 20 MG IM SOLR
20.0000 mg | Freq: Once | INTRAMUSCULAR | Status: AC
  Filled 2020-06-27: qty 20

## 2020-06-27 MED ORDER — LAMOTRIGINE 25 MG PO TABS
25.0000 mg | ORAL_TABLET | Freq: Every evening | ORAL | Status: DC
  Administered 2020-06-27 – 2020-06-30 (×4): 25 mg via ORAL
  Filled 2020-06-27 (×4): qty 1

## 2020-06-27 MED ORDER — HYDROXYZINE HCL 25 MG PO TABS
50.0000 mg | ORAL_TABLET | Freq: Three times a day (TID) | ORAL | Status: DC | PRN
  Administered 2020-06-27 – 2020-06-30 (×3): 50 mg via ORAL
  Filled 2020-06-27 (×3): qty 2

## 2020-06-27 MED ORDER — SODIUM CHLORIDE 0.9 % IV BOLUS
1000.0000 mL | Freq: Once | INTRAVENOUS | Status: AC
  Administered 2020-06-27: 1000 mL via INTRAVENOUS

## 2020-06-27 MED ORDER — IBUPROFEN 200 MG PO TABS: 600.0000 mg | ORAL_TABLET | Freq: Three times a day (TID) | ORAL | Status: DC | PRN

## 2020-06-27 MED ORDER — STERILE WATER FOR INJECTION IJ SOLN
INTRAMUSCULAR | Status: AC
  Administered 2020-06-27: 1.2 mL
  Filled 2020-06-27: qty 10

## 2020-06-27 MED ORDER — DROPERIDOL 2.5 MG/ML IJ SOLN: 5.0000 mg | Freq: Once | INTRAMUSCULAR | Status: DC

## 2020-06-27 MED ORDER — DIPHENHYDRAMINE HCL 50 MG/ML IJ SOLN
50.0000 mg | Freq: Once | INTRAMUSCULAR | Status: AC
  Administered 2020-06-27: 50 mg via INTRAVENOUS
  Filled 2020-06-27: qty 1

## 2020-06-27 MED ORDER — DIPHENHYDRAMINE HCL 50 MG/ML IJ SOLN: 50.0000 mg | Freq: Once | INTRAMUSCULAR | Status: DC

## 2020-06-27 MED ORDER — MIRTAZAPINE 7.5 MG PO TABS
7.5000 mg | ORAL_TABLET | Freq: Every evening | ORAL | Status: DC
  Administered 2020-06-27 – 2020-06-28 (×2): 7.5 mg via ORAL
  Filled 2020-06-27 (×2): qty 1

## 2020-06-27 MED ORDER — LITHIUM CARBONATE 300 MG PO CAPS
300.0000 mg | ORAL_CAPSULE | Freq: Three times a day (TID) | ORAL | Status: DC
  Administered 2020-06-27 – 2020-06-28 (×4): 300 mg via ORAL
  Filled 2020-06-27 (×4): qty 1

## 2020-06-27 NOTE — ED Notes (Signed)
Patient is talking excessively at times.  Restraints remain.  1:1 sitter is at the bedside

## 2020-06-27 NOTE — ED Notes (Signed)
Pt is snoring loudly and in no acute distress.

## 2020-06-27 NOTE — ED Notes (Signed)
Pt given a ham sandwich, two cartons of milk, and a piece of cheese. Pt is pleased with the portions.

## 2020-06-27 NOTE — ED Notes (Signed)
Patient appears to becoming more agitated.  Sitter is at the bedside.

## 2020-06-27 NOTE — ED Notes (Signed)
Pt is now ripping off all monitoring equipment.

## 2020-06-27 NOTE — ED Notes (Signed)
Patient's mother was updated in regards of going to [facility] tomorrow

## 2020-06-27 NOTE — BH Assessment (Signed)
Berneice Heinrich, NP recommends psychiatric hospitalization

## 2020-06-27 NOTE — ED Notes (Signed)
Day shift sitter off duty, no replacement sitter currently.  Charge nurse aware.

## 2020-06-27 NOTE — Progress Notes (Signed)
Pt accepted to [facility].   Patient meets inpatient criteria per Doran Heater, NP.     Dr.Thomas Loyola Mast is the attending provider.    Call report to [phone]    Robby Sermon @ [facility] notified.     Pt scheduled  to arrive at [facility] on 06/28/2020 AFTR 0800 AM

## 2020-06-27 NOTE — ED Notes (Signed)
Patient continues to have loud outbursts, yelling, making foul mouth remarks.  Numerous attempts to calm and make the room more calm

## 2020-06-27 NOTE — ED Notes (Signed)
Medication given for agitation- Patient laughing, using foul language etc.  1:1 sitter is at the bedside

## 2020-06-27 NOTE — ED Notes (Signed)
Pt out of room and in hallway. Pt is speaking to other patients in the hallway. Pt urinated in the hallway and went back into room and urinated and defecated on himself. PD called for assistance due to pt noncompliance

## 2020-06-27 NOTE — ED Notes (Addendum)
Pt came out of his room asking to get some new pants. His IV was noted to be out of his arm, the bed wet, and he was not wearing any pants. He then asked, "any nurses around here wanna fuck? Because I'm trying to nut." Pt's erratic behavior was not directable. Medication assistance requested. Pt was directed back in the bed and clean, dry clothes given to the patient to wear.

## 2020-06-27 NOTE — ED Notes (Signed)
Pt redirected to bed, lights turned off, and tv turned on to a calming television program.

## 2020-06-27 NOTE — ED Notes (Addendum)
Pt is awake and disturbing other patients in the hallway by loud singing and dancing. His actions are not redirectable.

## 2020-06-27 NOTE — ED Notes (Signed)
Patient urinated all over bed- Clean linens and patient was cleaned and dried.  Male purewick device applied to avoid wetting the bed.  Sitter is at the bedside.  The patient is beginning to be more calm at this time

## 2020-06-27 NOTE — ED Notes (Signed)
Verbal order for restraints obtained by Frederik Pear, PA. Pt was tied down to the bed with 4 point restraints for safety of the patient, staff, and other patients. While being in restraints, he defecated and urinated on himself. He was assisted to be cleaned up by four staff members.

## 2020-06-27 NOTE — ED Notes (Signed)
Patient is currently delusional- speaking about churches etc.  1:1 sitter remains at the bedside.  Bilateral restraints x 4 in place.  Patient smiles and is pleasant toward staff

## 2020-06-27 NOTE — ED Notes (Signed)
Pt's belongings were placed in one white bag that is kept in the cabinet labeled, "patient belongings 9-12 Hall [address]."

## 2020-06-27 NOTE — ED Notes (Signed)
Pt sitter at bedside. Pt called and requested milk to drink because his throat is dry. Pt also states that it is "time to come out of his restraints now" and "it has been long enough and he has rested." Pt informed by this tech that I am not allowed to make that decision but the RN will be informed. Pt in four point restraints at this time.
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
## 2020-06-27 NOTE — ED Notes (Addendum)
Upon going to move patient, [employer] noted to have left without warning or letting someone know.

## 2020-06-27 NOTE — Progress Notes (Signed)
Patient has been denied by [facility], therefore he has been faxed out. Patient meets inpatient criteria per Inetta Fermo Allen,NP. Patient referred to the following facilities:  [facility]  [address] [place] [address] [phone]  [facility]  [address], [place] [address] [phone]  [facility]  [address], [place] [address] [phone]  [facility]  [address] [place] [address] [phone]  [facility]  [address], [place] [address] [phone]  [facility]  [address] [place], [place] [address] [phone]  [facility]  [address] [place], [place] [address] [phone]  [facility]  [address], [place] [address] [phone]  [facility]  [address] [place] [address] [phone]  [facility]  [address] [place] [address] [phone]  [facility]  [address], [place] [address] [phone]   CSW will continue to monitor disposition.   Damita Dunnings, MSW, LCSW-A  11:27 AM 06/27/2020

## 2020-06-27 NOTE — BH Assessment (Signed)
Clinician attempted to complete pt's MH Assessment but pt was under the effects of medication and was slurring his words, making completing the assessment not possible at this time. The only thing clinician was able to understand was when pt stated, "Leave me alone, I'm trying to sleep." Clinician provided pt's team an update that the assessment would need to be completed at a later time.

## 2020-06-27 NOTE — ED Notes (Signed)
Pt speaking with TTS, TTS unable to assess pt due to him sleeping
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
## 2020-06-27 NOTE — ED Notes (Signed)
TTS consult completed- received message that the patient has been accepted at [facility] but will not be able to go until tomorrow

## 2020-06-27 NOTE — ED Notes (Signed)
Currently sleeping- 1:1 sitter is at the bedside

## 2020-06-27 NOTE — BH Assessment (Signed)
Attempted telepsych assessment with pt several times. Connection is slow and freezing up- unable to communicate with pt. Can meet with pt via speaker phone if available.

## 2020-06-27 NOTE — Consult Note (Signed)
Patient's attending RN requests as needed medication related to continued "outburst and loud behavior." Patient's QTC mildly prolonged at 472.  He also has several allergies to antipsychotic medications. Will order. -Ativan 2 mg every 4 as needed/agitation   Continue to monitor QTC.  Patient reviewed with Dr Bronwen Betters.

## 2020-06-27 NOTE — ED Notes (Signed)
PD at bedside.

## 2020-06-27 NOTE — ED Notes (Signed)
Medicated for anxiety and agitation

## 2020-06-27 NOTE — ED Notes (Signed)
Pt standing outside of room naked. Pt running towards the TCU unit trying to get in.

## 2020-06-27 NOTE — ED Notes (Addendum)
Pt given water, crackers and sandwich.  Pt continues to be loud, outbursts of mania, easily agitated.

## 2020-06-27 NOTE — BH Assessment (Addendum)
Comprehensive Clinical Assessment (CCA) Note  06/27/2020 Brandon Fuentes [id number]  Chief Complaint:  Chief Complaint  Patient presents with  . Medical Clearance  . IVC   Visit Diagnosis: Bipolar, severe, mixed Disposition: Berneice Heinrich, NP recommends psychiatric hospitalization  Brandon Fuentes is a 30 yo male who voluntarily presents to [facility] after mother called [employer] due to worry for pt. Pt presents with multiple sx of mania, including being hyperverbal, tangential & hyper-religious.  Pt presents lying in bed in restraints with a blanket covering his body. Pt is eager to talk and able to be interrupted without becoming irritable.  Pt reports he was discharged from [facility] in [place], [place] 2-3 weeks ago. He states he was there "for like a month". Pt states he has hx of several psychiatric hospitalizations.  He denies SI and HI. He reports AVH "nothly ghostly, but those, too and stuff you can't track- like B-32 electonics".  He reports decreased sleep (3-4hrs q hs) and increased appetite. When asked how he thinks he would best be helped in the hospital, pt states "Get me home to my Momma. She wants me to be coherent & not talk to the [place]".     Flowsheet Row ED from 06/26/2020 in [facility]  C-SSRS RISK CATEGORY No Risk    Therefore, for suicide precaution, telemonitoring is recommended.  The patient demonstrates the following risk factors for suicide: Chronic risk factors for suicide include: psychiatric disorder of  Bipolar Disorder. Acute risk factors for suicide include: unemployment, social withdrawal/isolation, loss (financial, interpersonal, professional) and recent discharge from inpatient psychiatry. Protective factors for this patient include: positive social support, responsibility to others (children, family) and religious beliefs against suicide. Considering these factors, the overall suicide risk at this point appears to be low. Patient  is not appropriate for outpatient follow up.    CCA Screening, Triage and Referral (STR)  Patient Reported Information How did you hear about [place]? Family/Friend  Referral name: Mother called police due to concern for pt's mental health  Whom do you see for routine medical problems? ED or Urgent Care  What Is the Reason for Your Visit/Call Today? I came for Warrent Orvan Falconer, the Lord told me, to take over for him. My mother told me not to go to [place]  How Long Has This Been Causing You Problems? No data recorded What Do You Feel Would Help You the Most Today? -- (Get me home to my Momma. She wants me to be coherent)   Have You Recently Been in Any Inpatient Treatment (Hospital/Detox/Crisis Center/28-Day Program)? Yes  Name/Location of Program/Hospital:[place] at [facility] about 2-3 weeks ago; pt reports hx of several inpt psychiatric admissions  How Long Were You There? "like a month"  When Were You Discharged?  ("2 or 3 weeks ago")   Have You Ever Received Services From [facility] Before? Yes  Who Do You See at [facility]? ED   Have You Recently Had Any Thoughts About Hurting Yourself? No  Are You Planning to Commit Suicide/Harm Yourself At This time? No   Have you Recently Had Thoughts About Hurting Someone Karolee Ohs? No   Have You Used Any Alcohol or Drugs in the Past 24 Hours? No   Do You Currently Have a Therapist/Psychiatrist? No (Dr. Richarda Blade at [facility])  Have You Been Recently Discharged From Any Office Practice or Programs? No    CCA Screening Triage Referral Assessment Type of Contact: Tele-Assessment  Is this Initial or  Reassessment? Initial Assessment  Date Telepsych consult ordered in [facility]:  06/27/2020  Time Telepsych consult ordered in [facility]:  0421   Patient Reported Information Reviewed? Yes   Collateral Involvement: Mother, Excell Seltzer [phone]   Does Patient Have a Court Appointed Legal Guardian?No  Is CPS involved or  ever been involved? Never  Is APS involved or ever been involved? Never   Patient Determined To Be At Risk for Harm To Self or Others Based on Review of Patient Reported Information or Presenting Complaint? No  Location of Assessment: [facility] ED   Does Patient Present under Involuntary Commitment? No  [place] of Residence: [place]   Patient Currently Receiving the Following Services: Medication Management   Determination of Need: Emergent (2 hours)   Options For Referral: Inpatient Hospitalization; Medication Management     CCA Biopsychosocial Intake/Chief Complaint:  Manic sx  Current Symptoms/Problems: mania   Patient Reported Schizophrenia/Schizoaffective Diagnosis in Past: No data recorded  Strengths: Unconditional love from God, Mom and family, smart   Type of Services Patient Feels are Needed: [facility], local psychiatrist   Initial Clinical Notes/Concerns: No data recorded  Mental Health Symptoms Depression:  Change in energy/activity; Difficulty Concentrating; Increase/decrease in appetite; Sleep (too much or little); Tearfulness   Duration of Depressive symptoms: Greater than two weeks   Mania:  Change in energy/activity; Increased Energy; Euphoria; Racing thoughts; Overconfidence; Recklessness   Anxiety:   Difficulty concentrating; Fatigue; Restlessness; Tension; Worrying   Psychosis:  Hallucinations ("Nothing ghostly,but that, too & stuff you can't track electronic B-32")   Duration of Psychotic symptoms: Less than six months   Trauma:  None ("not knowing how to decifer what God has lined up vs. the enemy")   Obsessions:  N/A   Compulsions:  N/A   Inattention:  N/A   Hyperactivity/Impulsivity:  Feeling of restlessness   Oppositional/Defiant Behaviors:  Aggression towards people/animals; Defies rules   Emotional Irregularity:  Intense/inappropriate anger; Potentially harmful impulsivity   Other Mood/Personality Symptoms:  No data  recorded   Mental Status Exam Appearance and self-care  Stature:  Tall   Weight:  Average weight   Clothing:  Careless/inappropriate (due to repeated disrobing, pt is covered with just a blanket)   Grooming:  No data recorded  Cosmetic use:  None   Posture/gait:  Tense   Motor activity:  Restless   Sensorium  Attention:  Distractible   Concentration:  Preoccupied   Orientation:  X5   Recall/memory:  Normal   Affect and Mood  Affect:  Labile   Mood:  Hypomania; Euphoric   Relating  Eye contact:  Normal   Facial expression:  Responsive   Attitude toward examiner:  Cooperative; Dramatic; Silly   Thought and Language  Speech flow: Pressured   Thought content:  Ideas of Reference; Delusions; Ideas of Influence   Preoccupation:  Religion   Hallucinations:  Visual   Organization:  No data recorded  [employer] of Knowledge:  Good   Intelligence:  Average   Abstraction:  Functional   Judgement:  Impaired   Reality Testing:  Distorted   Insight:  Gaps   Decision Making:  Impulsive   Social Functioning  Social Maturity:  Impulsive   Social Judgement:  Impropriety; Heedless   Stress  Stressors:  Other (Comment) (maintaining stability)   Coping Ability: resilient  Skill Deficits:  Self-care  Supports:  family    Religion: Religion/Spirituality Are You A Religious Person?: Yes What is Your Religious Affiliation?: [address]  Leisure/Recreation: Leisure / Recreation Do You Have Hobbies?: Yes Leisure and Hobbies: basketball, read, sing, write songs, help people- from previous assessment  Exercise/Diet: Exercise/Diet Do You Exercise?: No Do You Follow a Special Diet?: No Do You Have Any Trouble Sleeping?: Yes Explanation of Sleeping Difficulties: 3-4 hours q hs recently   CCA Employment/Education Employment/Work Situation: Employment / Work Situation Employment situation: Surveyor, minerals job has been impacted by current  illness: Yes Describe how patient's job has been impacted: mania  What is the longest time patient has a held a job?: 4 months Where was the patient employed at that time?: [employer] Has patient ever been in the [employer]?: No  Education: Education Is Patient Currently Attending School?: Yes School Currently Attending: [employer] Did Garment/textile technologist From [employer]?: Yes Did Theme park manager?: Yes What Type of College Degree Do you Have?: graduating from [employer] this semester Did You Attend Graduate School?: No Did You Have An Individualized Education Program (IIEP): No Did You Have Any Difficulty At [employer]?: No Patient's Education Has Been Impacted by Current Illness: Yes How Does Current Illness Impact Education?: mania   CCA Family/Childhood History Family and Relationship History: Family history Marital status: Single Does patient have children?: No  Childhood History:  Childhood History By whom was/is the patient raised?: Mother Additional childhood history information: parents divorced at age 71, good relationship with father and mother Description of patient's relationship with caregiver when they were a child: even after divorce, cooperative  How were you disciplined when you got in trouble as a child/adolescent?: whoopings  Does patient have siblings?: Yes Number of Siblings: 5 Description of patient's current relationship with siblings: 1 brother, 4 sisters- gets along with all of them Did patient suffer any verbal/emotional/physical/sexual abuse as a child?: No ("verbal abuse by anybody & I gave it back") Did patient suffer from severe childhood neglect?: No Has patient ever been sexually abused/assaulted/raped as an adolescent or adult?: No Was the patient ever a victim of a crime or a disaster?: No Witnessed domestic violence?: No Has patient been affected by domestic violence as an adult?: No    CCA Substance Use Alcohol/Drug Use: Alcohol / Drug Use Pain Medications:  See MAR Prescriptions: See MAR- Depakote & Lithium Over the Counter: See MAR History of alcohol / drug use?: No history of alcohol / drug abuse Longest period of sobriety (when/how long): NA          DSM5 Diagnoses: Patient Active Problem List   Diagnosis Date Noted  . Dehydration 06/17/2017  . Hypernatremia 06/17/2017  . Increased anion gap metabolic acidosis 06/17/2017  . Rhabdomyolysis 05/15/2017  . Pneumomediastinum (HCC)   . Pneumopericardium   . Elevated LFTs   . Bipolar disorder, curr episode mixed, severe, w/o psychotic features (HCC) 04/10/2016  . Bipolar I disorder, current or most recent episode manic, severe (HCC) 04/01/2016  . 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 Psychosis (HCC) 04/01/2016    Patient Centered Plan: Patient is on the following Treatment Plan(s):  Impulse Control    Anica Alcaraz Suzan Nailer, LCSW

## 2020-06-27 NOTE — ED Notes (Signed)
Pt is now urinating in the hallway.

## 2020-06-27 NOTE — ED Notes (Signed)
Secure message sent to Dr Freida Busman for need of IM medication for agitation.  Referred to [facility] NP.  Doran Heater NP was sent a message in regards

## 2020-06-28 ENCOUNTER — Encounter (HOSPITAL_BASED_OUTPATIENT_CLINIC_OR_DEPARTMENT_OTHER): Payer: Self-pay

## 2020-06-28 DIAGNOSIS — F309 Manic episode, unspecified: Secondary | ICD-10-CM | POA: Insufficient documentation

## 2020-06-28 DIAGNOSIS — F3163 Bipolar disorder, current episode mixed, severe, without psychotic features: Secondary | ICD-10-CM | POA: Diagnosis not present

## 2020-06-28 MED ORDER — CLONAZEPAM 1 MG PO TBDP
2.0000 mg | ORAL_TABLET | Freq: Two times a day (BID) | ORAL | Status: DC
  Administered 2020-06-28 – 2020-06-29 (×3): 2 mg via ORAL
  Filled 2020-06-28 (×3): qty 2

## 2020-06-28 MED ORDER — ZIPRASIDONE HCL 20 MG PO CAPS
20.0000 mg | ORAL_CAPSULE | Freq: Two times a day (BID) | ORAL | Status: DC
  Administered 2020-06-28 – 2020-06-29 (×2): 20 mg via ORAL
  Filled 2020-06-28 (×3): qty 1

## 2020-06-28 MED ORDER — STERILE WATER FOR INJECTION IJ SOLN
INTRAMUSCULAR | Status: AC
  Filled 2020-06-28: qty 10

## 2020-06-28 MED ORDER — ZIPRASIDONE MESYLATE 20 MG IM SOLR
20.0000 mg | Freq: Once | INTRAMUSCULAR | Status: DC
  Filled 2020-06-28: qty 20

## 2020-06-28 MED ORDER — KETAMINE HCL 50 MG/ML IJ SOLN
300.0000 mg | Freq: Once | INTRAMUSCULAR | Status: AC
  Administered 2020-06-28: 300 mg via INTRAMUSCULAR
  Filled 2020-06-28: qty 10

## 2020-06-28 MED ORDER — ZIPRASIDONE MESYLATE 20 MG IM SOLR
20.0000 mg | Freq: Once | INTRAMUSCULAR | Status: AC
  Administered 2020-06-28: 20 mg via INTRAMUSCULAR
  Filled 2020-06-28: qty 20

## 2020-06-28 MED ORDER — LITHIUM CARBONATE 300 MG PO CAPS
900.0000 mg | ORAL_CAPSULE | Freq: Two times a day (BID) | ORAL | Status: DC
  Administered 2020-06-28 – 2020-06-29 (×2): 900 mg via ORAL
  Filled 2020-06-28 (×2): qty 3

## 2020-06-28 MED ORDER — LITHIUM CARBONATE 300 MG PO CAPS
600.0000 mg | ORAL_CAPSULE | Freq: Once | ORAL | Status: AC
  Administered 2020-06-28: 600 mg via ORAL
  Filled 2020-06-28: qty 2

## 2020-06-28 NOTE — ED Notes (Signed)
Pt came out of restraints and walked into the hall naked and proceeded to dance down the hall into [place]. Pt redirected to his room and placed back into a gown with clean linen on the bed.

## 2020-06-28 NOTE — Consult Note (Addendum)
[facility] Face-to[facility]Face [facility] Consult   Reason for Consult:  Psychiatric reevaluation for manic behavior Referring Physician:  Dr. Freida BusmanAllen EDP Patient Identification: Brandon Fuentes MRN:  [id number] Principal Diagnosis: Bipolar I disorder, current or most recent episode manic, severe (HCC) Diagnosis:  Principal Problem:   Bipolar I disorder, current or most recent episode manic, severe (HCC)   Total Time spent with patient: 15 minutes  Subjective:   Brandon Fuentes is a 30 y.o. male patient  [facility] mother called [employer] due to worry for pt. Pt presents with multiple sx of mania, including being hyperverbal, tangential & hyper-religious.  Brandon Fuentes, 30 y.o., male patient seen face to face by this provider, consulted with Dr. Bronwen BettersLaubach; and chart reviewed on 06/28/20.     HPI:   During evaluation Brandon Fuentes is in a laying position and is currently in restraints. He is in no acute distress. He is alert, oriented x 4. He is sleepy and dozed off during the evaluation, but was easily aroused.  Eye contact is minimal. His speech is clear but pressured. He became agitated with questions. He is irritable with congruent affect. Patient began to raise his voice loudly and began praying out loud and religious talk. Patient is hyper religious and tangential. Judgment and insight are poor. Patient denies suicidal/self-harm/homicidal ideation and psychosis.  Collateral: Mother Jake SharkSandra Rohwer [phone].  Mother states the only medications patient is on currently is Depakote and lithium 900 mg p.o. twice daily.  Mother states that once the patient started taking Depakote he took a turn in his symptoms worsened. States she does not want him to take Depakote any longer.  Mother reports several allergies to antipsychotics.  States the patient has taken Geodon at low doses and his last psychiatric hospitalization and did well.  States patient was prescribed 2 mg of clonazepam  twice daily while he was hospitalized and was discharged home with a prescription but patient never filled it.  Mother stated she would prefer for patient to not go to [facility].  Explained to the mother that patient will go to [facility] if he is accepted.    Past Psychiatric History: psychosis, BPD 1,   Risk to Self:  pt denies Risk to Others:  pt denies Prior Inpatient Therapy:  yes  Prior Outpatient Therapy:  yes   Past Medical History:  Past Medical History:  Diagnosis Date  . Bipolar 1 disorder (HCC)    History reviewed. No pertinent surgical history. Family History:  Family History  Problem Relation Age of Onset  . Diabetes Mother    Family Psychiatric  History: unkown  Social History:  Social History   Substance and Sexual Activity  Alcohol Use No     Social History   Substance and Sexual Activity  Drug Use No    Social History   Socioeconomic History  . Marital status: Single    Spouse name: Not on file  . Number of children: Not on file  . Years of education: Not on file  . Highest education level: Not on file  Occupational History  . Not on file  Tobacco Use  . Smoking status: Never Smoker  . Smokeless tobacco: Never Used  Vaping Use  . Vaping Use: Never used  Substance and Sexual Activity  . Alcohol use: No  . Drug use: No  . Sexual activity: Yes    Birth control/protection: None  Other Topics Concern  . Not on file  Social History  Narrative  . Not on file   Social Determinants of Health   Financial Resource Strain: Not on file  Food Insecurity: Not on file  Transportation Needs: Not on file  Physical Activity: Not on file  Stress: Not on file  Social Connections: Not on file   Additional Social History:    Allergies:   Allergies  Allergen Reactions  . Atomoxetine Other (See Comments)    Increased agitation, due to Neuroleptic malignant syndrome (NMS)   . Haloperidol Lactate Other (See Comments)    Per mother--caused NMS  neuroleptic malignant syndrome  . Olanzapine Other (See Comments)    Neuroleptic malignant syndrome (NMS) after use  . Valproic Acid Other (See Comments)    Cannot take, due to Neuroleptic malignant syndrome (NMS)    . Chlorpromazine Other (See Comments)    Drug-induced liver injury  . Depakote [Divalproex Sodium] Other (See Comments)    Sleepiness, anger (Patient has Neuroleptic malignant syndrome- NMS)   . Geodon [Ziprasidone Hcl] Other (See Comments)    Per mom, patient gets very agitated  . Haloperidol Other (See Comments)    Per mother--caused NMS neuroleptic malignant syndrome  . Ketorolac Other (See Comments)    Cannot take, due to Neuroleptic malignant syndrome (NMS)  . Risperidone     Per mother caused Neuroleptic malignant syndrome  . Risperidone And Related Other (See Comments)    Per mother caused Neuroleptic malignant syndrome  . Zoloft [Sertraline Hcl] Other (See Comments)    Anger (patient has Neuroleptic malignant syndrome (NMS))    Labs:  Results for orders placed or performed during the hospital encounter of 06/26/20 (from the past 48 hour(s))  Resp Panel by RT-PCR (Flu A&B, Covid) Nasopharyngeal Swab     Status: None   Collection Time: 06/27/20 12:21 AM   Specimen: Nasopharyngeal Swab; Nasopharyngeal(NP) swabs in vial transport medium  Result Value Ref Range   SARS Coronavirus 2 by RT PCR NEGATIVE NEGATIVE    Comment: (NOTE) SARS-CoV-2 target nucleic acids are NOT DETECTED.  The SARS-CoV-2 RNA is generally detectable in upper respiratory specimens during the acute phase of infection. The lowest concentration of SARS-CoV-2 viral copies this assay can detect is 138 copies/mL. A negative result does not preclude SARS-Cov-2 infection and should not be used as the sole basis for treatment or other patient management decisions. A negative result may occur with  improper specimen collection/handling, submission of specimen other than nasopharyngeal swab, presence  of viral mutation(s) within the areas targeted by this assay, and inadequate number of viral copies(<138 copies/mL). A negative result must be combined with clinical observations, patient history, and epidemiological information. The expected result is Negative.  Fact Sheet for Patients:  BloggerCourse.com  Fact Sheet for Healthcare Providers:  SeriousBroker.it  This test is no t yet approved or cleared by the [place] FDA and  has been authorized for detection and/or diagnosis of SARS-CoV-2 by FDA under an Emergency Use Authorization (EUA). This EUA will remain  in effect (meaning this test can be used) for the duration of the COVID-19 declaration under Section 564(b)(1) of the Act, 21 U.S.C.section 360bbb-3(b)(1), unless the authorization is terminated  or revoked sooner.       Influenza A by PCR NEGATIVE NEGATIVE   Influenza B by PCR NEGATIVE NEGATIVE    Comment: (NOTE) The Xpert Xpress SARS-CoV-2/FLU/RSV plus assay is intended as an aid in the diagnosis of influenza from Nasopharyngeal swab specimens and should not be used as a sole basis for treatment. Nasal  washings and aspirates are unacceptable for Xpert Xpress SARS-CoV-2/FLU/RSV testing.  Fact Sheet for Patients: BloggerCourse.com  Fact Sheet for Healthcare Providers: SeriousBroker.it  This test is not yet approved or cleared by the [place] FDA and has been authorized for detection and/or diagnosis of SARS-CoV-2 by FDA under an Emergency Use Authorization (EUA). This EUA will remain in effect (meaning this test can be used) for the duration of the COVID-19 declaration under Section 564(b)(1) of the Act, 21 U.S.C. section 360bbb-3(b)(1), unless the authorization is terminated or revoked.  Performed at [facility], [address] [place], [place] [address]   Comprehensive metabolic  panel     Status: Abnormal   Collection Time: 06/27/20 12:44 AM  Result Value Ref Range   Sodium 140 135 - 145 mmol/L   Potassium 3.3 (L) 3.5 - 5.1 mmol/L   Chloride 105 98 - 111 mmol/L   CO2 26 22 - 32 mmol/L   Glucose, Bld 116 (H) 70 - 99 mg/dL    Comment: Glucose reference range applies only to samples taken after fasting for at least 8 hours.   BUN 11 6 - 20 mg/dL   Creatinine, Ser 6.04 0.61 - 1.24 mg/dL   Calcium 8.7 (L) 8.9 - 10.3 mg/dL   Total Protein 7.7 6.5 - 8.1 g/dL   Albumin 3.9 3.5 - 5.0 g/dL   AST 36 15 - 41 U/L   ALT 32 0 - 44 U/L   Alkaline Phosphatase 50 38 - 126 U/L   Total Bilirubin 0.3 0.3 - 1.2 mg/dL   GFR, Estimated >54 >09 mL/min    Comment: (NOTE) Calculated using the CKD-EPI Creatinine Equation (2021)    Anion gap 9 5 - 15    Comment: Performed at [facility], [address], [place], [place] [address]  Ethanol     Status: None   Collection Time: 06/27/20 12:44 AM  Result Value Ref Range   Alcohol, Ethyl (B) <10 <10 mg/dL    Comment: (NOTE) Lowest detectable limit for serum alcohol is 10 mg/dL.  For medical purposes only. Performed at [facility], [address] [place], [place] [address]   CBC with Diff     Status: Abnormal   Collection Time: 06/27/20 12:44 AM  Result Value Ref Range   WBC 13.6 (H) 4.0 - 10.5 K/uL   RBC 4.77 4.22 - 5.81 MIL/uL   Hemoglobin 12.8 (L) 13.0 - 17.0 g/dL   HCT 56.2 13.0 - 86.5 %   MCV 85.5 80.0 - 100.0 fL   MCH 26.8 26.0 - 34.0 pg   MCHC 31.4 30.0 - 36.0 g/dL   RDW 78.4 69.6 - 29.5 %   Platelets 170 150 - 400 K/uL   nRBC 0.0 0.0 - 0.2 %   Neutrophils Relative % 64 %   Neutro Abs 8.6 (H) 1.7 - 7.7 K/uL   Lymphocytes Relative 24 %   Lymphs Abs 3.3 0.7 - 4.0 K/uL   Monocytes Relative 10 %   Monocytes Absolute 1.4 (H) 0.1 - 1.0 K/uL   Eosinophils Relative 2 %   Eosinophils Absolute 0.3 0.0 - 0.5 K/uL   Basophils Relative 0 %   Basophils Absolute 0.0 0.0 - 0.1 K/uL    Immature Granulocytes 0 %   Abs Immature Granulocytes 0.05 0.00 - 0.07 K/uL    Comment: Performed at [facility], [address] [place], [place] [address]  Acetaminophen level     Status: Abnormal  Collection Time: 06/27/20 12:44 AM  Result Value Ref Range   Acetaminophen (Tylenol), Serum <10 (L) 10 - 30 ug/mL    Comment: (NOTE) Therapeutic concentrations vary significantly. A range of 10-30 ug/mL  may be an effective concentration for many patients. However, some  are best treated at concentrations outside of this range. Acetaminophen concentrations >150 ug/mL at 4 hours after ingestion  and >50 ug/mL at 12 hours after ingestion are often associated with  toxic reactions.  Performed at [facility], [address] [place], [place] [address]   Salicylate level     Status: Abnormal   Collection Time: 06/27/20 12:44 AM  Result Value Ref Range   Salicylate Lvl <7.0 (L) 7.0 - 30.0 mg/dL    Comment: Performed at [facility], [address], [place], [place] [address]  Lithium level     Status: Abnormal   Collection Time: 06/27/20 12:44 AM  Result Value Ref Range   Lithium Lvl 0.55 (L) 0.60 - 1.20 mmol/L    Comment: Performed at [facility], [address] [place], [place] [address]  CK     Status: Abnormal   Collection Time: 06/27/20 12:44 AM  Result Value Ref Range   Total CK 1,147 (H) 49 - 397 U/L    Comment: Performed at [facility], [address], [place], [place] [address]  Urine rapid drug screen (hosp performed)     Status: None   Collection Time: 06/27/20  6:42 AM  Result Value Ref Range   Opiates NONE DETECTED NONE DETECTED   Cocaine NONE DETECTED NONE DETECTED   Benzodiazepines NONE DETECTED NONE DETECTED   Amphetamines NONE DETECTED NONE DETECTED   Tetrahydrocannabinol NONE DETECTED NONE DETECTED   Barbiturates NONE DETECTED NONE DETECTED    Comment: (NOTE) DRUG  SCREEN FOR MEDICAL PURPOSES ONLY.  IF CONFIRMATION IS NEEDED FOR ANY PURPOSE, NOTIFY LAB WITHIN 5 DAYS.  LOWEST DETECTABLE LIMITS FOR URINE DRUG SCREEN Drug Class                     Cutoff (ng/mL) Amphetamine and metabolites    1000 Barbiturate and metabolites    200 Benzodiazepine                 200 Tricyclics and metabolites     300 Opiates and metabolites        300 Cocaine and metabolites        300 THC                            50 Performed at [facility], [address], [place], [place] [address]     Current Facility-Administered Medications  Medication Dose Route Frequency Provider Last Rate Last Admin  . alum & mag hydroxide-simeth (MAALOX/MYLANTA) 200-200-20 MG/5ML suspension 30 mL  30 mL Oral Q6H PRN McDonald, Mia A, PA-C      . clonazePAM (KLONOPIN) disintegrating tablet 2 mg  2 mg Oral BID Ardis Hughs, NP   2 mg at 06/28/20 1357  . hydrOXYzine (ATARAX/VISTARIL) tablet 50 mg  50 mg Oral TID PRN McDonald, Mia A, PA-C   50 mg at 06/28/20 1128  . ibuprofen (ADVIL) tablet 600 mg  600 mg Oral Q8H PRN McDonald, Mia A, PA-C      . lamoTRIgine (LAMICTAL) tablet 25 mg  25 mg Oral QPM McDonald, Mia A, PA-C   25 mg  at 06/27/20 1715  . lithium carbonate capsule 900 mg  900 mg Oral BID Vernard Gambles H, NP      . LORazepam (ATIVAN) injection 2 mg  2 mg Intramuscular Q4H PRN Lenard Lance, FNP   2 mg at 06/28/20 1127  . mirtazapine (REMERON) tablet 7.5 mg  7.5 mg Oral QPM McDonald, Mia A, PA-C   7.5 mg at 06/27/20 1715  . ondansetron (ZOFRAN) tablet 4 mg  4 mg Oral Q8H PRN McDonald, Mia A, PA-C      . pantoprazole (PROTONIX) EC tablet 40 mg  40 mg Oral Daily McDonald, Mia A, PA-C   40 mg at 06/28/20 1004  . ziprasidone (GEODON) capsule 20 mg  20 mg Oral BID WC Vernard Gambles H, NP      . ziprasidone (GEODON) injection 20 mg  20 mg Intramuscular Once Ardis Hughs, NP       Current Outpatient Medications  Medication Sig Dispense Refill  .  divalproex (DEPAKOTE ER) 500 MG 24 hr tablet Take 1,000 mg by mouth 2 (two) times daily.    . feeding supplement, ENSURE ENLIVE, (ENSURE ENLIVE) LIQD Take 237 mLs by mouth 2 (two) times daily between meals. 237 mL 12  . lithium carbonate (ESKALITH) 450 MG CR tablet Take 900 mg by mouth 2 (two) times daily.    Marland Kitchen amLODipine (NORVASC) 5 MG tablet Take 1 tablet by mouth daily.    . carvedilol (COREG) 6.25 MG tablet Take 6.25 mg by mouth 2 (two) times daily.    . hydrOXYzine (ATARAX/VISTARIL) 50 MG tablet Take 1 tablet (50 mg total) by mouth 3 (three) times daily as needed (agitation). 30 tablet 0  . lamoTRIgine (LAMICTAL) 25 MG tablet Take 25 mg by mouth every evening.    . lithium carbonate 300 MG capsule Take 300 mg by mouth 3 (three) times daily. (Patient not taking: Reported on 06/27/2020)    . mirtazapine (REMERON) 15 MG tablet Take 7.5 mg by mouth every evening.    . pantoprazole (PROTONIX) 40 MG tablet Take 40 mg by mouth daily.    . valsartan (DIOVAN) 160 MG tablet Take 160 mg by mouth daily.    . ziprasidone (GEODON) 20 MG capsule Take 20 mg by mouth every morning.      Musculoskeletal: Strength & Muscle Tone: within normal limits Gait & Station: normal Patient leans: N/A   Psychiatric Specialty Exam:  Presentation  General Appearance: Disheveled  Eye Contact:Fleeting  Speech:Clear and Coherent; Normal Rate  Speech Volume:Normal  Handedness:Right   Mood and Affect  Mood:Anxious; Irritable  Affect:Congruent   Thought Process  Thought Processes:Coherent  Descriptions of Associations:Tangential  Orientation:Full (Time, Place and Person)  Thought Content:Tangential  History of Schizophrenia/Schizoaffective disorder:No data recorded Duration of Psychotic Symptoms:Less than six months  Hallucinations:Hallucinations: None  Ideas of Reference:None  Suicidal Thoughts:Suicidal Thoughts: No  Homicidal Thoughts:Homicidal Thoughts: No   Sensorium  Memory:Immediate  Fair; Remote Fair; Recent Fair  Judgment:Poor  Insight:Poor   Executive Functions  Concentration:Poor  Attention Span:Poor  Recall:Poor  Fund of Knowledge:Fair  Language:Poor   Psychomotor Activity  Psychomotor Activity:Psychomotor Activity: Normal   Assets  Assets:Financial Resources/Insurance; Housing; Physical Health; Resilience; Social Support   Sleep  Sleep:Sleep: Poor   Physical Exam: Physical Exam Vitals reviewed.  HENT:     Head: Normocephalic.     Right Ear: External ear normal.     Left Ear: External ear normal.  Eyes:     Conjunctiva/sclera: Conjunctivae normal.  Cardiovascular:  Rate and Rhythm: Normal rate.  Pulmonary:     Effort: No respiratory distress.  Musculoskeletal:        General: Normal range of motion.     Cervical back: Normal range of motion.  Skin:    General: Skin is warm.  Neurological:     Mental Status: He is alert and oriented to person, place, and time.  Psychiatric:        Attention and Perception: Perception normal. He is inattentive.        Mood and Affect: Mood is anxious. Affect is angry.        Speech: Speech is rapid and pressured and tangential.        Behavior: Behavior is agitated.        Thought Content: Thought content does not include homicidal or suicidal ideation.        Cognition and Memory: Cognition normal.        Judgment: Judgment is impulsive.    Review of Systems  Unable to perform ROS: Acuity of condition   Blood pressure 138/90, pulse (!) 117, temperature 97.8 F (36.6 C), temperature source Oral, resp. rate (!) 22, SpO2 98 %. There is no height or weight on file to calculate BMI.   Treatment Plan Summary:  Patient continues to meet inpatient psychiatric criteria. Daily contact with patient by tts/psych provider to monitor progress and medication management.   Case consult with Dr. Bronwen Betters. EKG's were reviewed. QTC 472.   Geodon 20 mg PO BID added Clonazepam 2 mg PO BID  added. Lithium increased to at home dose 900 mg BID Repeat EKG ordered for 06/28/2020   Both medications patients mother states he tolerated well on his last admission with [facility].    Disposition: Patient continues to meet inpatient criteria. Recommend psychiatric Inpatient admission when medically cleared.  Ardis Hughs, NP 06/28/2020 2:57 PM

## 2020-06-28 NOTE — ED Notes (Signed)
Patient is spitting in the air.  Patient is loud and shouting foul language.  Restraints remain x 4.  Sitter is at the bedside

## 2020-06-28 NOTE — ED Notes (Signed)
Pt was becoming increasingly agitated. He removed his condom cath and urinated in bed. Staff removed soiled linens and placed pads under pt. Pt currently singing and engaging is self talk and flight of ideas.

## 2020-06-28 NOTE — ED Provider Notes (Signed)
Emergency Medicine Observation Re-evaluation Note  Brandon Fuentes is a 30 y.o. male, seen on rounds today.  Pt initially presented to the ED for complaints of Medical Clearance and IVC Currently, the patient is dancing in the hallways.  Physical Exam  BP (!) 165/91   Pulse 76   Temp 98 F (36.7 C) (Axillary)   Resp (!) 21   SpO2 95%  Physical Exam General: Alert, somewhat cooperative  Psych: Appears to be responding to internal stimuli  ED Course / MDM  EKG:   I have reviewed the labs performed to date as well as medications administered while in observation.  Recent changes in the last 24 hours include .  Plan  Current plan is for patient was to go to [facility] today but has been out of restraints for 24 hours before they can accept patient.  Have informed nursing to contact psychiatry for medication adjustment Patient is under full IVC at this time.   Lorre Nick, MD 06/28/20 [phone]

## 2020-06-28 NOTE — ED Notes (Addendum)
Writer took over at 0300 PT asleep. Place PT back on monitor.PT vitals have been updated. Empty PT 1800 output urine from urine bag
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
## 2020-06-28 NOTE — ED Notes (Signed)
Patient is out of restraints at this time

## 2020-06-28 NOTE — ED Notes (Signed)
1:1 sitter is at the bedside.  Planning to transfer to [facility] this morning.  Patient is awake at this time.  Rambling speech.

## 2020-06-28 NOTE — BH Assessment (Signed)
[facility] Assessment Progress Note  This pt presents under IVC initiated by EDP Melene Plan, DO.  Per Doran Heater, NP pt requires psychiatric hospitalization, and pt has been accepted to [facility].  For details, please see Damita Dunnings, CSW's note dated 06/27/20 at 11:37.  However, pt has since needed to be placed in restraints, and [facility] has reportedly deferred transfer until pt is out of restraints for 24 hours.  At 12:14 I called [facility] and spoke to [place].  She confirms these details, noting that they will continue to hold bed for this pt until he meets conditions noted above.  She asks that we call periodically with updates.  Final disposition is pending as of this writing.  Doylene Canning, [place] Behavioral Health Coordinator [phone]

## 2020-06-28 NOTE — ED Notes (Signed)
Currently out of restraints.  Sitting in a chair at the bedside.  Currently being cooperative but speech is rambling

## 2020-06-28 NOTE — ED Notes (Signed)
Patient is escorted to the shower with his sitter.  Currently cooperative

## 2020-06-28 NOTE — ED Notes (Signed)
Patient appears to be sleeping at this times.  Sitter is at the bedside.

## 2020-06-28 NOTE — ED Notes (Signed)
The patient's mother, Dois Davenport was notified of today's events and that the transfer to [facility] has been cancelled due to the patient not being able to stay unrestrained.  She verbalizes understanding circumstances

## 2020-06-28 NOTE — ED Notes (Signed)
After much resistance and with much assistance, the patient is back to room 11 and in his bed.  Continuous cardiac monitor and oxygen saturation monitor in place

## 2020-06-28 NOTE — ED Notes (Signed)
Pt verbally aggressive with sitter. Called sitter "bitch" while addressing her at beginning of shift while addressing her. Pt advised to not address members of staff in that manner.

## 2020-06-29 DIAGNOSIS — F3163 Bipolar disorder, current episode mixed, severe, without psychotic features: Secondary | ICD-10-CM | POA: Diagnosis not present

## 2020-06-29 LAB — LITHIUM LEVEL: Lithium Lvl: 0.59 mmol/L — ABNORMAL LOW (ref 0.60–1.20)

## 2020-06-29 MED ORDER — LITHIUM CARBONATE 300 MG PO CAPS
1200.0000 mg | ORAL_CAPSULE | Freq: Two times a day (BID) | ORAL | Status: DC
  Administered 2020-06-29 – 2020-07-01 (×4): 1200 mg via ORAL
  Filled 2020-06-29 (×4): qty 4

## 2020-06-29 MED ORDER — CLONAZEPAM 1 MG PO TBDP
2.0000 mg | ORAL_TABLET | Freq: Three times a day (TID) | ORAL | Status: DC
  Administered 2020-06-29 – 2020-07-01 (×5): 2 mg via ORAL
  Filled 2020-06-29 (×5): qty 2

## 2020-06-29 MED ORDER — KETAMINE HCL 50 MG/ML IJ SOLN
300.0000 mg | Freq: Once | INTRAMUSCULAR | Status: AC
  Administered 2020-06-29: 300 mg via INTRAMUSCULAR
  Filled 2020-06-29: qty 10

## 2020-06-29 MED ORDER — OXCARBAZEPINE 150 MG PO TABS
150.0000 mg | ORAL_TABLET | Freq: Two times a day (BID) | ORAL | Status: DC
  Administered 2020-06-29 – 2020-07-01 (×4): 150 mg via ORAL
  Filled 2020-06-29 (×4): qty 1

## 2020-06-29 MED ORDER — ZIPRASIDONE HCL 20 MG PO CAPS
60.0000 mg | ORAL_CAPSULE | Freq: Two times a day (BID) | ORAL | Status: DC
  Administered 2020-06-29 – 2020-07-01 (×4): 60 mg via ORAL
  Filled 2020-06-29 (×3): qty 3

## 2020-06-29 MED ORDER — PROPRANOLOL HCL 20 MG PO TABS
10.0000 mg | ORAL_TABLET | Freq: Three times a day (TID) | ORAL | Status: DC
  Administered 2020-06-29 – 2020-07-01 (×6): 10 mg via ORAL
  Filled 2020-06-29 (×6): qty 1

## 2020-06-29 NOTE — ED Notes (Signed)
Order for restraints continued with Delo, MD at midnight. Pt continued to have sitter 1:1 and was monitored by staff. His behavior continued to present with aggression and agitation that made it unsafe to release restraints.

## 2020-06-29 NOTE — ED Notes (Signed)
Security at bedside. Staff assist in personal hygiene. Pads placed under pt.  Condom cath applied. Gown placed on pt

## 2020-06-29 NOTE — ED Notes (Signed)
Pt became increasingly agitated and aggressive. He was biting at right wrist restraint. He was verbally aggressive toward provider, sitter and other staff attempting verbal deescalating. Pt was able to get mitten off right wrist. Security stayed while ketamine

## 2020-06-29 NOTE — ED Notes (Signed)
Pt currently not in restraints, sitting in chair, singing, calm, gave pt drink, sitter at bedside, will continue to monitor

## 2020-06-29 NOTE — ED Notes (Signed)
Security at bedside for staff to adjust left wrist restraint.

## 2020-06-29 NOTE — Consult Note (Signed)
  Psychiatry: I was asked at this morning's psychiatric system progression to take a look at this patient because Dr. Lucianne Muss is currently on vacation.  Chart reviewed.  Patient not seen in person.  Case reviewed with mental health staff.  30 year old man with a history of bipolar disorder currently displaying what sounds like manic symptoms in the [facility] emergency room.  Agitated behavior.  Hyperverbal.  Has shown some aggression and swung his fists at doctors and staff.  Has required multiple episodes of restraint.  Lithium level on admission was around 0.56 which is lower than typically needed for this condition.  I had a level rechecked today to be sure that that was not an artifact of noncompliance and the level today was 0.59.  This suggests that the patient needs a higher dose of lithium to reach a therapeutic blood level.  Orders placed to increase dose to 1200 mg twice a day.  Patient has a history of a diagnosis of neuroleptic malignant syndrome.  I read the chart about this episode and it sounds like while it was not absolutely classic there is a good chance that that is what he had which definitely makes it more difficult to treat psychosis and agitation.  I was asked to contact Dr. Adriana Simas at [facility] to make a special pleading for [facility].  I will follow through and talk with him or leave a message now that I am told that the referral has been made.

## 2020-06-29 NOTE — BH Assessment (Addendum)
Disposition:   Upon chart review, "Pt has been intermittently violent and difficult to redirect during ED visit, frequently requiring restraints.  While a referral to [facility] remains active, it has been decided to concurrently refer pt to [facility] as an alternative". Chart review indicates that a referral information was faxed to [facility].  "At 13:30 Jermaine confirmed receipt of referral".    @ 2137 Disposition Counselor followed up with [name] and confirmed with "Selena Batten", patient has been officially accepted to the [facility] wait list.    Called back @ 2140 to inquire about the possibility of patient being placed on the "priority wait list" due to patient's "imtermittently violent behaviors". Per "Vonna Kotyk", patient has been moved to the priority wait list instead.   Next shift Disposition Counselor to continue following patient's ongoing status on the Priority wait list for [name].  Also, active bed status at [facility].

## 2020-06-29 NOTE — ED Notes (Signed)
Pt refused VS at this time .

## 2020-06-29 NOTE — ED Notes (Signed)
Pt rattling bed rails trying to pull restraint from bed

## 2020-06-29 NOTE — ED Notes (Signed)
Pt defecated in sink, advised pt to not do that, brought pt bedside commode. Advised pt if he is calm and cooperative, we could hopefully arrange for him to take a shower later. Pt now sitting in room, calm, sitter at bedside

## 2020-06-29 NOTE — ED Provider Notes (Signed)
Emergency Medicine Observation Re-evaluation Note  Brandon Fuentes is a 30 y.o. male, seen on rounds today.  Pt initially presented to the ED for complaints of Medical Clearance and IVC Currently, the patient is in restraints, communicative.  Physical Exam  BP (!) 145/64   Pulse 66   Temp 97.9 F (36.6 C) (Oral)   Resp (!) 24   SpO2 100%  Physical Exam General: agitated Cardiac: normal rate Lungs: no increased WOB Psych: in restraints, no fighting, but a bit agitated  ED Course / MDM  EKG:EKG Interpretation  Date/Time:  Monday Jun 27 2020 06:20:44 EDT Ventricular Rate:  88 PR Interval:  139 QRS Duration: 73 QT Interval:  390 QTC Calculation: 472 R Axis:   53 Text Interpretation: Sinus rhythm Borderline prolonged QT interval No significant change since last tracing Confirmed by Alvira Monday ([id number]) on 06/28/2020 3:05:41 PM   I have reviewed the labs performed to date as well as medications administered while in observation.  Recent changes in the last 24 hours include needed restraints.  Plan  Current plan is for awaiting placement. Patient is under full IVC at this time.   Rolan Bucco, MD 06/29/20 [phone]

## 2020-06-29 NOTE — ED Notes (Signed)
Pt currently biting at restraints. When sitter asked pt to stop, pt began yelling and cursing at her. RN notified.

## 2020-06-29 NOTE — ED Notes (Signed)
Pt sleeping, chest rising and falling, pt in NAD, sitter at bedside 

## 2020-06-29 NOTE — ED Notes (Signed)
Pt naked and attempting to urinate on staff and security. Pt aggressive and pushing against security.

## 2020-06-29 NOTE — ED Notes (Signed)
When awake, pt tries to pull and use force to break restraint. He also maneuvers hands to try and pull at velcro on restraint. Mittens applied. Delo, MD aware of pt actions.

## 2020-06-29 NOTE — ED Notes (Signed)
Pt spitting at security, pt physically pushing security.

## 2020-06-29 NOTE — ED Notes (Signed)
Pt took off all his clothes and rubbing his hands on his body in hall way. Pt redirected back to room. Pt hyper, but not aggressive at this time.

## 2020-06-29 NOTE — ED Notes (Signed)
Pt intentionally urinated when staff entered the room. He announced it before performing the act. Pt was able to remove the condom cath.

## 2020-06-29 NOTE — ED Notes (Signed)
Spoke w pt, pt agrees to be calm and cooperative if we release restraints. Pt has taken all medications without any issues. Going to release restraints, provider aware, sitter at bedside

## 2020-06-29 NOTE — ED Notes (Addendum)
Pt went to take shower, sitter w patient and security on standby. Pt states he "will be on my best behavior"

## 2020-06-29 NOTE — ED Notes (Signed)
Pt received breakfast trey, sitter assisting pt w eating since pt is in restraints

## 2020-06-29 NOTE — Progress Notes (Signed)
Attempted to see patient. Patient was at his door yelling at security. Unable to perform psychiatric assessment.   Case consulted with Dr. Damian Leavell.   Added Trileptal 150 mg PO BID  Added Propanolol for aggression 10 mg PO TID Increased Geodon to 60 mg PO BID Increased Clonazepam to 2 mg PO TID  Ordered EKG for AM, current ECK 05/31 QT/QTC 396/457

## 2020-06-29 NOTE — ED Provider Notes (Signed)
He continues to be delusional, aggressive and will not stay in his room.  He is responding to internal stimuli.  He is more agitated since there are other psychiatric patients undergoing the same problem at this time.  Additional medications ordered.   Mancel Bale, MD 06/29/20 [phone]

## 2020-06-29 NOTE — BH Assessment (Addendum)
[facility]HH Assessment Progress Note  At 08:39 this Clinical research associate called [facility] and spoke to [place].  She reports that they are still holding a bed for this pt, but he will need to be out of restraints for 24 hours.  Final disposition for this involuntary pt is pending.  EDP Rolan Bucco, MD and pt's nurse, Lillia Abed, have been notified.  Doylene Canning, [place] Behavioral Health Coordinator [phone]  Addendum:  Pt has been intermittently violent and difficult to redirect during ED visit, frequently requiring restraints.  While referral to [facility] remains active, it has been decided to concurrently refer pt to [facility] as an alternative.  At 13:02 this writer called [name] and spoke to [place], who accepted demographic information by telephone.  Referral information was then faxed to [facility].  At 13:30 Jermaine confirmed receipt of referral.  Please note that this does not mean that pt has been accepted to the facility.  As of this writing a final decision is pending.  Doylene Canning, MA Triage Specialist [phone]

## 2020-06-29 NOTE — ED Notes (Signed)
Pt became very hyper, started singing and talking loudly, rambling speech that doesn't make sense. Security at bedside, continuously redirecting patient back into room. Pt attempted to sit in hallway bed, security escorted pt back into room. Pt making sexual comments and gestures towards this RN. Pt urinated into a cup and then took a sip of his own urine. Attempting to re-direct pt and keep pt in his room, will continue to monitor, security at bedside

## 2020-06-29 NOTE — ED Notes (Signed)
Security called to bedside to assist staff.

## 2020-06-29 NOTE — ED Notes (Signed)
Pt woke up, singing very loudly, security at bedside. Attempting to re-direct pt and keep pt in room. Pt undressed completely and walked down hallway, but then put clothes back on. Pt is very hyper, hyper verbal, but not aggressive. Will continue to monitor

## 2020-06-30 DIAGNOSIS — F3163 Bipolar disorder, current episode mixed, severe, without psychotic features: Secondary | ICD-10-CM | POA: Diagnosis not present

## 2020-06-30 MED ORDER — KETAMINE HCL 50 MG/ML IJ SOLN: 300.0000 mg | Freq: Once | INTRAMUSCULAR | Status: DC

## 2020-06-30 MED ORDER — LORAZEPAM 2 MG/ML IJ SOLN: 2.0000 mg | INTRAMUSCULAR | Status: DC | PRN

## 2020-06-30 MED ORDER — KETAMINE HCL 50 MG/ML IJ SOLN
300.0000 mg | Freq: Once | INTRAMUSCULAR | Status: AC
  Administered 2020-06-30: 300 mg via INTRAMUSCULAR
  Filled 2020-06-30: qty 6

## 2020-06-30 NOTE — Progress Notes (Signed)
Was able to see patient today while he was participating in the tts assessment with Melynda Ripple. I came into the room towards the end of the assessment. Patient was alert and pleasant. He was calm and cooperative.   Ativan 2 mg intramuscular injection Q4H PRN for severe agitation was increased to 2 mg intramuscular injection Q2H PRN.   Case was consulted with Dr. Damian Leavell.
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
## 2020-06-30 NOTE — ED Notes (Signed)
PATIENT REFUSED VITALS

## 2020-06-30 NOTE — ED Provider Notes (Signed)
Patient becoming more aggressive, requiring security guards to physically restrain him.  I have reviewed his records.  Patient cannot take most antipsychotics because of history of neuroleptic malignant syndrome.  He was given Geodon earlier which seems to have "made things worse".  Reviewing his records reveals he has received ketamine on several occasions for this type of behavior, will order ketamine IM.  Close observation by nursing staff after administration.   Gilda Crease, MD 06/30/20 [phone]

## 2020-06-30 NOTE — ED Notes (Signed)
Walked in to find pt covered in urine and pt being held down by 3 security guards. Pt spitting in the face of our security. This Clinical research associate asked for pt to be moved to the seclusion room in [facility] for the safety of our staff and pts own safety. Pt walked to seclusion room and then pt got very aggressive hitting walls . Pt urinated in the floor in seclusion room. Pt is now laying down . Security and sitter is watching pt on the monitor at nurses desk.

## 2020-06-30 NOTE — BH Assessment (Signed)
BHH Assessment Progress Note  Pt reportedly continues to require psychiatric hospitalization at this time.  Pt has been accepted to [facility] by Dr Estill Cotta, however, he must be out of restraints and/or seclusion for 24 hours before he can be transferred.  Pt's nurse, Addison Naegeli, spoke to [facility] this morning and they are continuing to hold a bed for pt.  This Clinical research associate also referred pt to [facility] yesterday, 06/29/2020.  At 08:49 I called them and spoke to [facility], who reports that pt is now on their wait list as a priority referral.  Final disposition is pending as of this writing.  Doylene Canning, [place] Behavioral Health Coordinator [phone]

## 2020-06-30 NOTE — ED Notes (Signed)
Pt was let out of seclusion room at 1235. Pt is currently eating his lunch and being cooperative in rm 42. Pt washed himself off and changed his clothes after he finished eating. Pt is now laying in the bed. Pt is calm and cooperative at this time. RN Sibyl Parr made aware  Security and sitter is still maintaining visualization of pt on the monitor

## 2020-06-30 NOTE — ED Notes (Addendum)
Pt has been calm and cooperative. Pt is walking around the area in and out of rooms but not being detructive. Pt has been in and out of asleep. Pt ate his dinner tray in his room and has also showered. Pt spoke to his mother on the phone with no problems. Pt smiling but still talking delusional.

## 2020-06-30 NOTE — Progress Notes (Signed)
06/30/2020  0843 [facility] [phone] Spoke with Nettie Elm at [facility] d/t patient being in the seclusion room it is considered a restraint therefore they will not accept him until he out of restraints for 24 hrs. Per Nettie Elm they will hold the bed and he can come tomorrow if no restraints.

## 2020-06-30 NOTE — ED Notes (Signed)
Pt is continuously dressing and undressing. Pt is currently naked and rubbing his body on the walls.

## 2020-06-30 NOTE — ED Notes (Signed)
Pt is standing in the seclusion room naked and hitting and spitting on the walls and glass. Pt has ripped up his meal tray and poured his drink and ice on himself. Pt has periods of sitting down or laying but gets right back up agitated. Pt continuously putting his gown and socks on then removing them moments later.

## 2020-06-30 NOTE — ED Provider Notes (Addendum)
Patient getting agitated again.  Chart reviewed, it appears that there is escalation in his psych meds yesterday evening.  Patient has received 2 rounds of ketamine IM since 7 PM, I will have to give him third round of medicine.  Allegedly Geodon IM was not very effective.  Patient is still awaiting placement. Will need restraints, as he is a constant threat to safety of our staff.  Reassessment: Charge nurse informed me that patient will be placed in seclusion room.  Still counts as restraints, I have placed the orders.  Patient is pacing back-and-forth in the room.  Does not seem like he is improving in the ER setting.   CRITICAL CARE Performed by: Nayeliz Hipp   Total critical care time: 32 minutes  Critical care time was exclusive of separately billable procedures and treating other patients.  Critical care was necessary to treat or prevent imminent or life-threatening deterioration.  Critical care was time spent personally by me on the following activities: development of treatment plan with patient and/or surrogate as well as nursing, discussions with consultants, evaluation of patient's response to treatment, examination of patient, obtaining history from patient or surrogate, ordering and performing treatments and interventions, ordering and review of laboratory studies, ordering and review of radiographic studies, pulse oximetry and re-evaluation of patient's condition.    Derwood Kaplan, MD 06/30/20 0920  2:04 PM Patient was reassessed around 1:30 PM. He has been removed from the seclusion room.  Patient has received TTS evaluation.  He is currently calm.  I have sent a message to [facility] team to see if we are changing management plan. Hopefully he is remains calm for transfer to outside facility.   Addendum: Patient was assessed by me at 9:15 AM after he was was placed in the seclusion room.  I went to the seclusion area again, patient is still pacing back-and-forth,  doing jumping jacks.  Unchanged from before.  Charge nurse has informed me that the staff is not feeling safe with the patient given his burst of aggression.  Awaiting recommendations from psych.    Derwood Kaplan, MD 06/30/20 1005    Derwood Kaplan, MD 06/30/20 [phone]

## 2020-06-30 NOTE — ED Notes (Signed)
Patient attempting to exit room.  4 security guards currently at bedside.

## 2020-06-30 NOTE — BH Assessment (Addendum)
Requested nursing [facility]) to place TTS machine in patient's room for his TTS re-assessment.   Dekina, RN, notified this Clinician that patient is in the seclusion room and has been asleep for approximately 10 minutes.   Clinician requested Dekina, RN, to contact TTS once patient awakens and is alert enough to complete his TTS re-assessment.   Provided updates to [facility] provider Vernard Gambles, NP) and patient continues to meet inpatient psychiatric treatment. Disposition Counselor to seek appropriate placement for patient. Disposition Counselor and nursing provided disposition updates.

## 2020-07-01 DIAGNOSIS — F3163 Bipolar disorder, current episode mixed, severe, without psychotic features: Secondary | ICD-10-CM | POA: Diagnosis not present

## 2020-07-01 NOTE — ED Notes (Addendum)
Patient is gyrating and in a naked position. Massaging his chest. Sitter plus 2 security officers maintaining visualization of patient at all times

## 2020-07-01 NOTE — BH Assessment (Signed)
BHH Assessment Progress Note  Plans to transfer pt to [facility] have been finalized and pt is now en route via [facility].  At 10:00 I called [name] and spoke to [facility], notifying her that pt no longer needs a bed at their facility.  Doylene Canning, [place] Behavioral Health Coordinator [phone]

## 2020-07-01 NOTE — ED Provider Notes (Signed)
Pt reevaluated before transfer to treatment facility.  Pt feels good.  Pt states he is ready to go.  Vitals stable   Brandon Fuentes 07/01/20 1840    Cheryll Cockayne, MD 07/02/20 1030

## 2020-07-01 NOTE — ED Notes (Addendum)
Patient trying to talk to sitter Sitter plus 2 security officers maintaining visualization of patient at all times

## 2020-07-01 NOTE — ED Notes (Signed)
Patients bags (2) ready for patients transfer

## 2020-07-01 NOTE — ED Notes (Addendum)
Patient trying to preach the gospel and has lost his voice and is whispering do to excessive use of loud voice.  Sitter plus 2 security officers maintaining visualization of patient at all times

## 2020-07-01 NOTE — ED Notes (Signed)
Patient received breakfast tray.  Sitter plus 2 security officers maintaining visualization of patient at all times.

## 2020-07-01 NOTE — ED Notes (Signed)
Pt off unit to [facility] per provider. Pt alert, no s/s of distress. DC information and belongings given to sheriff for transport. Pt ambulatory off unit , escorted and transported by sheriff.

## 2020-07-01 NOTE — ED Notes (Signed)
Two police officers arrived at [employer] for patient Sitter plus two security officers obtaining vital signs for patient transfer

## 2020-07-02 NOTE — ED Notes (Signed)
Patient took himself out of restraints- trial release started

## 2020-07-07 NOTE — ED Notes (Signed)
The restraints were not released.  Patient was able to come out of the restraints. Patient was attempted to give directions but is noncompliant.  Security is at the bedside

## 2020-07-07 NOTE — ED Notes (Signed)
Patient had 1:1 sitter- Patient was able to take him out of restraints and was up out of bed.  The restraints were never released purposely.  Patient was up, running around the unit, naked.  Patient noncompliant to any direction.  MD was made aware

## 2021-01-05 ENCOUNTER — Other Ambulatory Visit: Payer: Self-pay

## 2021-01-05 ENCOUNTER — Emergency Department (HOSPITAL_COMMUNITY)
Admission: EM | Admit: 2021-01-05 | Discharge: 2021-01-05 | Disposition: A | Discharge status: Home / Self Care | Attending: Emergency Medicine | Admitting: Emergency Medicine

## 2021-01-05 DIAGNOSIS — Y900 Blood alcohol level of less than 20 mg/100 ml: Secondary | ICD-10-CM | POA: Insufficient documentation

## 2021-01-05 DIAGNOSIS — E860 Dehydration: Secondary | ICD-10-CM | POA: Diagnosis not present

## 2021-01-05 DIAGNOSIS — R456 Violent behavior: Secondary | ICD-10-CM | POA: Diagnosis present

## 2021-01-05 DIAGNOSIS — Z20822 Contact with and (suspected) exposure to covid-19: Secondary | ICD-10-CM | POA: Diagnosis not present

## 2021-01-05 DIAGNOSIS — R4689 Other symptoms and signs involving appearance and behavior: Secondary | ICD-10-CM

## 2021-01-05 LAB — RESP PANEL BY RT-PCR (FLU A&B, COVID) ARPGX2
Influenza A by PCR: NEGATIVE
Influenza B by PCR: NEGATIVE
SARS Coronavirus 2 by RT PCR: NEGATIVE

## 2021-01-05 LAB — CBC WITH DIFFERENTIAL/PLATELET
Abs Immature Granulocytes: 0.02 10*3/uL (ref 0.00–0.07)
Basophils Absolute: 0.1 10*3/uL (ref 0.0–0.1)
Basophils Relative: 1 %
Eosinophils Absolute: 0.3 10*3/uL (ref 0.0–0.5)
Eosinophils Relative: 3 %
HCT: 50.2 % (ref 39.0–52.0)
Hemoglobin: 15.7 g/dL (ref 13.0–17.0)
Immature Granulocytes: 0 %
Lymphocytes Relative: 26 %
Lymphs Abs: 2.5 10*3/uL (ref 0.7–4.0)
MCH: 25.5 pg — ABNORMAL LOW (ref 26.0–34.0)
MCHC: 31.3 g/dL (ref 30.0–36.0)
MCV: 81.5 fL (ref 80.0–100.0)
Monocytes Absolute: 1 10*3/uL (ref 0.1–1.0)
Monocytes Relative: 10 %
Neutro Abs: 5.9 10*3/uL (ref 1.7–7.7)
Neutrophils Relative %: 60 %
Platelets: 226 10*3/uL (ref 150–400)
RBC: 6.16 MIL/uL — ABNORMAL HIGH (ref 4.22–5.81)
RDW: 15.4 % (ref 11.5–15.5)
WBC: 9.9 10*3/uL (ref 4.0–10.5)
nRBC: 0 % (ref 0.0–0.2)

## 2021-01-05 LAB — COMPREHENSIVE METABOLIC PANEL
ALT: 87 U/L — ABNORMAL HIGH (ref 0–44)
AST: 56 U/L — ABNORMAL HIGH (ref 15–41)
Albumin: 4.2 g/dL (ref 3.5–5.0)
Alkaline Phosphatase: 57 U/L (ref 38–126)
Anion gap: 15 (ref 5–15)
BUN: 16 mg/dL (ref 6–20)
CO2: 24 mmol/L (ref 22–32)
Calcium: 9.9 mg/dL (ref 8.9–10.3)
Chloride: 108 mmol/L (ref 98–111)
Creatinine, Ser: 1.46 mg/dL — ABNORMAL HIGH (ref 0.61–1.24)
GFR, Estimated: >60 mL/min (ref >60)
Glucose, Bld: 90 mg/dL (ref 70–99)
Potassium: 3.8 mmol/L (ref 3.5–5.1)
Sodium: 147 mmol/L — ABNORMAL HIGH (ref 135–145)
Total Bilirubin: 1.7 mg/dL — ABNORMAL HIGH (ref 0.3–1.2)
Total Protein: 7.7 g/dL (ref 6.5–8.1)

## 2021-01-05 LAB — ETHANOL: Alcohol, Ethyl (B): <10 mg/dL (ref <10)

## 2021-01-05 LAB — CK: Total CK: 690 U/L — ABNORMAL HIGH (ref 49–397)

## 2021-01-05 MED ORDER — PANTOPRAZOLE SODIUM 40 MG PO TBEC: 40.0000 mg | DELAYED_RELEASE_TABLET | Freq: Every day | ORAL | Status: DC

## 2021-01-05 MED ORDER — AMLODIPINE BESYLATE 5 MG PO TABS: 5.0000 mg | ORAL_TABLET | Freq: Every day | ORAL | Status: DC

## 2021-01-05 MED ORDER — ZIPRASIDONE HCL 20 MG PO CAPS: 20.0000 mg | ORAL_CAPSULE | Freq: Every morning | ORAL | Status: DC

## 2021-01-05 MED ORDER — DIVALPROEX SODIUM ER 500 MG PO TB24
1000.0000 mg | ORAL_TABLET | Freq: Two times a day (BID) | ORAL | Status: DC
  Filled 2021-01-05: qty 2

## 2021-01-05 MED ORDER — CARVEDILOL 3.125 MG PO TABS: 6.2500 mg | ORAL_TABLET | Freq: Two times a day (BID) | ORAL | Status: DC

## 2021-01-05 MED ORDER — ZIPRASIDONE MESYLATE 20 MG IM SOLR: 20.0000 mg | Freq: Four times a day (QID) | INTRAMUSCULAR | Status: DC | PRN

## 2021-01-05 MED ORDER — IRBESARTAN 150 MG PO TABS
150.0000 mg | ORAL_TABLET | Freq: Every day | ORAL | Status: DC
  Filled 2021-01-05: qty 1

## 2021-01-05 MED ORDER — LAMOTRIGINE 25 MG PO TABS
25.0000 mg | ORAL_TABLET | Freq: Every evening | ORAL | Status: DC
  Filled 2021-01-05: qty 1

## 2021-01-05 MED ORDER — LITHIUM CARBONATE ER 300 MG PO TBCR
300.0000 mg | EXTENDED_RELEASE_TABLET | Freq: Two times a day (BID) | ORAL | Status: DC
  Filled 2021-01-05: qty 1

## 2021-01-05 MED ORDER — MIRTAZAPINE 7.5 MG PO TABS
7.5000 mg | ORAL_TABLET | Freq: Every evening | ORAL | Status: DC
  Filled 2021-01-05: qty 1

## 2021-01-05 MED ORDER — LITHIUM CARBONATE ER 450 MG PO TBCR: 900.0000 mg | EXTENDED_RELEASE_TABLET | Freq: Two times a day (BID) | ORAL | Status: DC

## 2021-01-05 NOTE — ED Notes (Signed)
EDP and sheriff spoke and discussed plan of care and discharge of patient .

## 2021-01-05 NOTE — Discharge Instructions (Signed)
It is very important that you maximize your fluid hydration and the soonest possible discuss your medication regimen with a psychiatrist.  Your history of being on lithium or other medications is complex and requires a thorough conversation to find the appropriate medication for management of your bipolar disorder.  Return here for concerning changes in your condition.

## 2021-01-05 NOTE — ED Triage Notes (Signed)
[facility]. For about a week pt has been noncompliant with meds. Pt refuses to eat/drink, bangs on the cell, and was found naked. Pt has been speaking incoherently. Pt has schizophrenia and bipolar. Pt rakes haldol and Depakote.  EMS gave 5mg  of midazolam enroute. Pt agreeable upon arrival. Able to engage in short conversation before speaking incoherently.

## 2021-01-05 NOTE — ED Provider Notes (Signed)
[facility] EMERGENCY DEPARTMENT Provider Note   CSN: [id number] Arrival date & time: 01/05/21  1701     History Chief Complaint  Patient presents with   Psychiatric Evaluation    Brandon Fuentes is a 30 y.o. male.  HPI Patient presents accompanied by correctional facility officers with concerns for agitation, labile behavior and psychosis.  Patient is currently calm, answering some questions himself, but cannot specify his history in depth.  He intermittently talks about his relationship with God, seems to indicate that he is not currently taking his prior medication regimen which may have included lithium for bipolar disorder.  Level 5 caveat secondary to behavioral health condition. Per correctional facility officers the patient has had multiple episodes in the past days of explosive agitation with violence.  No precipitant identified, and episodes seem to resolve without substantial intervention.  Reported the patient was previously on lithium, but is currently taking Haldol.  Chart review notable for hospitalizations in another region of the state with prior mention of neuroleptic malignant syndrome and rhabdomyolysis, though without obvious cause.     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     Family History  Problem Relation Age of Onset   Diabetes Mother     Social History   Tobacco Use    Smoking status: Never   Smokeless tobacco: Never  Vaping Use   Vaping Use: Never used  Substance Use Topics   Alcohol use: No   Drug use: No    Home Medications Prior to Admission medications   Medication Sig Start Date End Date Taking? Authorizing Provider  amLODipine (NORVASC) 5 MG tablet Take 1 tablet by mouth daily. 06/14/20   [provider]  carvedilol (COREG) 6.25 MG tablet Take 6.25 mg by mouth 2 (two) times daily. 05/11/20   [provider]  divalproex (DEPAKOTE ER) 500 MG 24 hr tablet Take 1,000 mg by mouth 2 (two) times daily. 06/14/20   [provider]  feeding supplement, ENSURE ENLIVE, (ENSURE ENLIVE) LIQD Take 237 mLs by mouth 2 (two) times daily between meals. 06/19/17   Rolly Salter, MD  hydrOXYzine (ATARAX/VISTARIL) 50 MG tablet Take 1 tablet (50 mg total) by mouth 3 (three) times daily as needed (agitation). 06/19/17   Rolly Salter, MD  lamoTRIgine (LAMICTAL) 25 MG tablet Take 25 mg by mouth every evening.    [provider]  lithium carbonate (ESKALITH) 450 MG CR tablet Take 900 mg by mouth 2 (two) times daily. 06/14/20   [provider]  lithium carbonate 300 MG capsule Take 300 mg by mouth 3 (three) times daily. Patient not taking: Reported on 06/27/2020 06/10/14   [provider]  mirtazapine (REMERON) 15 MG tablet Take 7.5 mg by mouth every evening.    [provider]  pantoprazole (PROTONIX) 40 MG tablet Take 40 mg by mouth daily.    [provider]  valsartan (DIOVAN) 160 MG tablet Take 160 mg by mouth daily. 06/14/20   [provider]  ziprasidone (GEODON) 20 MG capsule Take 20 mg by mouth every morning. 05/11/20   [provider]    Allergies    Atomoxetine, Haloperidol lactate, Olanzapine, Valproic acid, Chlorpromazine, Depakote [divalproex sodium], Geodon [ziprasidone hcl], Haloperidol, Ketorolac, Risperidone, Risperidone and related, and Zoloft [sertraline hcl]  Review of  Systems   Review of Systems  Unable to perform ROS: Psychiatric disorder   Physical Exam Updated Vital Signs BP 137/80   Pulse 73   Temp 97.7 F (36.5 C) (Temporal)   Resp 18   Ht 6\' 1"  (1.854 m)   Wt (!) 174.2 kg   SpO2 98%   BMI 50.66 kg/m   Physical Exam Vitals and nursing note reviewed.  Constitutional:      General: He is not in acute distress.    Appearance: He is well-developed.     Comments: Large young adult male awake and alert  HENT:     Head: Normocephalic and atraumatic.  Eyes:     Conjunctiva/sclera: Conjunctivae normal.  Cardiovascular:     Rate and Rhythm: Normal rate and regular rhythm.  Pulmonary:     Effort: Pulmonary effort is normal. No respiratory distress.     Breath sounds: No stridor.  Abdominal:     General: There is no distension.  Skin:    General: Skin is warm and dry.  Neurological:     Mental Status: He is alert and oriented to person, place, and time.  Psychiatric:     Comments: Delusional, with statements of grandiosity and his relationship with God.  No insight into his current presentation.    ED Results / Procedures / Treatments   Labs (all labs ordered are listed, but only abnormal results are displayed) Labs Reviewed  COMPREHENSIVE METABOLIC PANEL - Abnormal; Notable for the following components:      Result Value   Sodium 147 (*)    Creatinine, Ser 1.46 (*)    AST 56 (*)    ALT 87 (*)    Total Bilirubin 1.7 (*)    All other components within normal limits  CBC WITH DIFFERENTIAL/PLATELET - Abnormal; Notable for the following components:   RBC 6.16 (*)    MCH 25.5 (*)    All other components within normal limits  CK - Abnormal; Notable for the following components:   Total CK 690 (*)    All other components within normal limits  RESP PANEL BY RT-PCR (FLU A&B, COVID) ARPGX2  ETHANOL  URINALYSIS, ROUTINE W REFLEX MICROSCOPIC    EKG EKG Interpretation  Date/Time:  Thursday January 05 2021 17:09:49 EST Ventricular  Rate:  62 PR Interval:  115 QRS Duration: 96 QT Interval:  504 QTC Calculation: 512 R Axis:   61 Text Interpretation: Unknown rhythm, irregular rate Borderline short PR interval Nonspecific T abnormalities, inferior leads Prolonged QT interval Abnormal ECG Confirmed by Carmin Muskrat [phone]) on 01/05/2021 6:34:55 PM  Radiology No results found.  Procedures Procedures   Medications Ordered in ED Medications  lithium carbonate (LITHOBID) CR tablet 300 mg (has no administration in time range)  ziprasidone (GEODON) injection 20 mg (has no administration in time range)  carvedilol (COREG) tablet 6.25 mg (has no administration in time range)  divalproex (DEPAKOTE ER) 24 hr tablet 1,000 mg (has no administration in time range)  lamoTRIgine (LAMICTAL) tablet 25 mg (has no administration in time range)  lithium carbonate (  ESKALITH) CR tablet 900 mg (has no administration in time range)  mirtazapine (REMERON) tablet 7.5 mg (has no administration in time range)  irbesartan (AVAPRO) tablet 150 mg (has no administration in time range)  ziprasidone (GEODON) capsule 20 mg (has no administration in time range)  amLODipine (NORVASC) tablet 5 mg (has no administration in time range)  pantoprazole (PROTONIX) EC tablet 40 mg (has no administration in time range)    ED Course  I have reviewed the triage vital signs and the nursing notes.  Pertinent labs & imaging results that were available during my care of the patient were reviewed by me and considered in my medical decision making (see chart for details). After initial evaluation with consideration of the patient's manic behavior, questionable medication usage he was provided lithium, and home medications were restarted.  7:52 PM Patient awake, alert, pleasant.  Labs reviewed, discussed with [facility] department staff.  Patient was sent here for medical evaluation, and the staff representative notes that they have a psychiatrist on staff who is  comfortable managing the patient's bipolar medications, and the preference is for the patient to return to that facility for ongoing behavior health management there if he is medically appropriate here.  Here findings consistent with mild dehydration with slight elevation in creatinine compared to that of 7 months ago, recurrence of slight liver enzyme abnormalities, though not critically, otherwise reassuring labs.  With some suspicion for mild dehydration, patient was encouraged to drink plenty fluids, in staff notes that this can be facilitated there.  Without additional outbursts here without violence here, with calm demeanor, patient is appropriate for discharge with additional instructions to consider his bipolar medication regimen with [place] staff soon as possible. MDM Rules/Calculators/A&P MDM Number of Diagnoses or Management Options Aggressive behavior: new, needed workup Mild dehydration: new, needed workup   Amount and/or Complexity of Data Reviewed Clinical lab tests: ordered and reviewed Tests in the medicine section of CPT: reviewed and ordered Decide to obtain previous medical records or to obtain history from someone other than the patient: yes Obtain history from someone other than the patient: yes Review and summarize past medical records: yes Independent visualization of images, tracings, or specimens: yes  Risk of Complications, Morbidity, and/or Mortality Presenting problems: high Diagnostic procedures: high Management options: high  Critical Care Total time providing critical care: < 30 minutes  Patient Progress Patient progress: stable   Final Clinical Impression(s) / ED Diagnoses Final diagnoses:  Aggressive behavior  Mild dehydration    Rx / DC Orders ED Discharge Orders     None        Carmin Muskrat, MD 01/05/21 1954

## 2021-02-19 ENCOUNTER — Emergency Department (HOSPITAL_COMMUNITY)

## 2021-02-19 ENCOUNTER — Inpatient Hospital Stay (HOSPITAL_COMMUNITY)
Admission: EM | Admit: 2021-02-19 | Discharge: 2021-02-24 | DRG: 640 | Attending: Internal Medicine | Admitting: Internal Medicine

## 2021-02-19 DIAGNOSIS — E871 Hypo-osmolality and hyponatremia: Secondary | ICD-10-CM | POA: Diagnosis present

## 2021-02-19 DIAGNOSIS — E870 Hyperosmolality and hypernatremia: Secondary | ICD-10-CM | POA: Diagnosis not present

## 2021-02-19 DIAGNOSIS — J982 Interstitial emphysema: Secondary | ICD-10-CM | POA: Diagnosis present

## 2021-02-19 DIAGNOSIS — Z888 Allergy status to other drugs, medicaments and biological substances status: Secondary | ICD-10-CM

## 2021-02-19 DIAGNOSIS — R4182 Altered mental status, unspecified: Secondary | ICD-10-CM

## 2021-02-19 DIAGNOSIS — Z6841 Body Mass Index (BMI) 40.0 and over, adult: Secondary | ICD-10-CM

## 2021-02-19 DIAGNOSIS — N179 Acute kidney failure, unspecified: Secondary | ICD-10-CM | POA: Diagnosis present

## 2021-02-19 DIAGNOSIS — E876 Hypokalemia: Secondary | ICD-10-CM | POA: Diagnosis not present

## 2021-02-19 DIAGNOSIS — Z79899 Other long term (current) drug therapy: Secondary | ICD-10-CM

## 2021-02-19 DIAGNOSIS — R739 Hyperglycemia, unspecified: Secondary | ICD-10-CM | POA: Diagnosis present

## 2021-02-19 DIAGNOSIS — D696 Thrombocytopenia, unspecified: Secondary | ICD-10-CM | POA: Diagnosis present

## 2021-02-19 DIAGNOSIS — E875 Hyperkalemia: Secondary | ICD-10-CM | POA: Diagnosis not present

## 2021-02-19 DIAGNOSIS — E860 Dehydration: Secondary | ICD-10-CM | POA: Diagnosis present

## 2021-02-19 DIAGNOSIS — Z833 Family history of diabetes mellitus: Secondary | ICD-10-CM

## 2021-02-19 DIAGNOSIS — F3113 Bipolar disorder, current episode manic without psychotic features, severe: Secondary | ICD-10-CM | POA: Diagnosis present

## 2021-02-19 DIAGNOSIS — G9341 Metabolic encephalopathy: Secondary | ICD-10-CM | POA: Diagnosis present

## 2021-02-19 DIAGNOSIS — D72829 Elevated white blood cell count, unspecified: Secondary | ICD-10-CM | POA: Diagnosis present

## 2021-02-19 DIAGNOSIS — R9431 Abnormal electrocardiogram [ECG] [EKG]: Secondary | ICD-10-CM | POA: Diagnosis present

## 2021-02-19 LAB — COMPREHENSIVE METABOLIC PANEL
ALT: 67 U/L — ABNORMAL HIGH (ref 0–44)
AST: 68 U/L — ABNORMAL HIGH (ref 15–41)
Albumin: 4.4 g/dL (ref 3.5–5.0)
Alkaline Phosphatase: 54 U/L (ref 38–126)
Anion gap: 19 — ABNORMAL HIGH (ref 5–15)
BUN: 38 mg/dL — ABNORMAL HIGH (ref 6–20)
CO2: 25 mmol/L (ref 22–32)
Calcium: 10.2 mg/dL (ref 8.9–10.3)
Chloride: 119 mmol/L — ABNORMAL HIGH (ref 98–111)
Creatinine, Ser: 1.7 mg/dL — ABNORMAL HIGH (ref 0.61–1.24)
GFR, Estimated: 56 mL/min — ABNORMAL LOW (ref >60)
Glucose, Bld: 127 mg/dL — ABNORMAL HIGH (ref 70–99)
Potassium: 3.8 mmol/L (ref 3.5–5.1)
Sodium: 163 mmol/L (ref 135–145)
Total Bilirubin: 1.7 mg/dL — ABNORMAL HIGH (ref 0.3–1.2)
Total Protein: 8.1 g/dL (ref 6.5–8.1)

## 2021-02-19 LAB — CBC WITH DIFFERENTIAL/PLATELET
Abs Immature Granulocytes: 0.02 10*3/uL (ref 0.00–0.07)
Basophils Absolute: 0.1 10*3/uL (ref 0.0–0.1)
Basophils Relative: 1 %
Eosinophils Absolute: 0.2 10*3/uL (ref 0.0–0.5)
Eosinophils Relative: 1 %
HCT: 53 % — ABNORMAL HIGH (ref 39.0–52.0)
Hemoglobin: 16.5 g/dL (ref 13.0–17.0)
Immature Granulocytes: 0 %
Lymphocytes Relative: 23 %
Lymphs Abs: 2.9 10*3/uL (ref 0.7–4.0)
MCH: 26.7 pg (ref 26.0–34.0)
MCHC: 31.1 g/dL (ref 30.0–36.0)
MCV: 85.8 fL (ref 80.0–100.0)
Monocytes Absolute: 1.2 10*3/uL — ABNORMAL HIGH (ref 0.1–1.0)
Monocytes Relative: 9 %
Neutro Abs: 8.6 10*3/uL — ABNORMAL HIGH (ref 1.7–7.7)
Neutrophils Relative %: 66 %
Platelets: 126 10*3/uL — ABNORMAL LOW (ref 150–400)
RBC: 6.18 MIL/uL — ABNORMAL HIGH (ref 4.22–5.81)
RDW: 18.6 % — ABNORMAL HIGH (ref 11.5–15.5)
WBC: 12.9 10*3/uL — ABNORMAL HIGH (ref 4.0–10.5)
nRBC: 0 % (ref 0.0–0.2)

## 2021-02-19 LAB — ACETAMINOPHEN LEVEL: Acetaminophen (Tylenol), Serum: <10 ug/mL — ABNORMAL LOW (ref 10–30)

## 2021-02-19 LAB — ETHANOL: Alcohol, Ethyl (B): <10 mg/dL (ref <10)

## 2021-02-19 LAB — SALICYLATE LEVEL: Salicylate Lvl: <7 mg/dL — ABNORMAL LOW (ref 7.0–30.0)

## 2021-02-19 NOTE — ED Provider Notes (Signed)
[facility] EMERGENCY DEPARTMENT Provider Note   CSN: [id number] Arrival date & time: 02/19/21  2152     History  Chief Complaint  Patient presents with   Altered Mental Status    Brandon Fuentes is a 31 y.o. male.  `   HPI will deferred due to level 5 caveat altered mental status  Patient with medical history including bipolar, mania, rhabdo, neuroleptic malignant syndrome [facility] status.  Police officer at bedside explained that patient has been altered, he states that over the last 48 hours the patient has not been able to communicate very well and has not been eating or drinking, he does endorse that patient still making urine and having bowel movements, he thinks that the patient ate his breakfast today.  He states that the patient still has all of his motor functions but is unable to understand the patient's speech as he is just mumbling incoherent words, at baseline apparently patient was able to have normal conversations and can understand his speech.  It appears last several is approximately 48 hours ago.  He states that he has had no recent falls no recent traumas, he  states that there is been no changes in medications to his knowledge, they are concerned that patient possibly is dehydrated and is the cause of his altered mental status.  After reviewing patient's charts has been seen here multiple times for agitation, most recent was 12/08, patient was discharged home and was recommend to be placed back on his antipsychotic medications.  Reviewing the documents coming from the jail appears patient is just taking hydralazine he was at 1 point taking lithium as well as Depakote peers but is not taking these any longer.  Looks like patient was taken off these medications as he had neuroleptic malignant syndrome back in October 2022 at another facility.  Home Medications Prior to Admission  medications   Medication Sig Start Date End Date Taking? Authorizing Provider  hydrOXYzine (ATARAX/VISTARIL) 50 MG tablet Take 1 tablet (50 mg total) by mouth 3 (three) times daily as needed (agitation). Patient taking differently: Take 50 mg by mouth 3 (three) times daily. 06/19/17  Yes Lavina Hamman, MD  feeding supplement, ENSURE ENLIVE, (ENSURE ENLIVE) LIQD Take 237 mLs by mouth 2 (two) times daily between meals. Patient not taking: Reported on 02/20/2021 06/19/17   Lavina Hamman, MD      Allergies    Atomoxetine, Haloperidol lactate, Olanzapine, Valproic acid, Chlorpromazine, Depakote [divalproex sodium], Geodon [ziprasidone hcl], Haloperidol, Ketorolac, Risperidone, Risperidone and related, and Zoloft [sertraline hcl]    Review of Systems   Review of Systems  Unable to perform ROS: Mental status change   Physical Exam Updated Vital Signs BP 113/73    Pulse 66    Resp 20    SpO2 100%  Physical Exam Vitals and nursing note reviewed.  Constitutional:      General: He is not in acute distress.    Appearance: He is not ill-appearing.     Comments: Found resting comfortably in bed, show no acute signs distress, he was handcuffed at his hands and legs he was not strapped to the bed.  HENT:     Head: Normocephalic and atraumatic.     Nose: No congestion.     Mouth/Throat:     Mouth: Mucous membranes are dry.     Pharynx: Oropharynx is clear. No oropharyngeal exudate or posterior oropharyngeal erythema.  Eyes:     Extraocular Movements: Extraocular movements intact.     Conjunctiva/sclera: Conjunctivae normal.     Pupils: Pupils are equal, round, and reactive to light.  Cardiovascular:     Rate and Rhythm: Normal rate and regular rhythm.     Pulses: Normal pulses.     Heart sounds: No murmur heard.   No friction rub. No gallop.  Pulmonary:     Effort: No respiratory distress.     Breath sounds: No wheezing, rhonchi or rales.  Abdominal:     General: There is no distension.      Palpations: Abdomen is soft.     Tenderness: There is abdominal tenderness. There is no right CVA tenderness or left CVA tenderness.     Comments: Abdomen nondistended, normal bowel sounds, dull to percussion, he was slightly tender to palpation noted in his lower abdomen, there is no guarding, rebound tenderness, peritoneal sign.  Musculoskeletal:     Cervical back: Neck supple.     Right lower leg: No edema.     Left lower leg: No edema.     Comments: Good muscle tone, moving all 4 extremities he was nonrigid.  Spine was palpated is nontender palpation no overlying skin changes present.  Skin:    General: Skin is warm and dry.     Comments: Skin exam was performed there is no noted rashes, ecchymosis, abrasions, no track marks present.  Neurological:     Mental Status: He is alert.     Comments: Neurological exam was limited, he was oriented to person only,speech was incoherent, did not appear to be slurring. no facial asymmetry, no unilateral weakness, he is able follow two-step commands.  Psychiatric:        Mood and Affect: Mood normal.     Comments: None tremulous on my exam no signs of obvious withdrawals, does not appear to be responding to internal stimuli    ED Results / Procedures / Treatments   Labs (all labs ordered are listed, but only abnormal results are displayed) Labs Reviewed  COMPREHENSIVE METABOLIC PANEL - Abnormal; Notable for the following components:      Result Value   Sodium 163 (*)    Chloride 119 (*)    Glucose, Bld 127 (*)    BUN 38 (*)    Creatinine, Ser 1.70 (*)    AST 68 (*)    ALT 67 (*)    Total Bilirubin 1.7 (*)    GFR, Estimated 56 (*)    Anion gap 19 (*)    All other components within normal limits  CBC WITH DIFFERENTIAL/PLATELET - Abnormal; Notable for the following components:   WBC 12.9 (*)    RBC 6.18 (*)    HCT 53.0 (*)    RDW 18.6 (*)    Platelets 126 (*)    Neutro Abs 8.6 (*)    Monocytes Absolute 1.2 (*)    All other  components within normal limits  ACETAMINOPHEN LEVEL - Abnormal; Notable for the following components:   Acetaminophen (Tylenol), Serum <10 (*)    All other components within normal limits  SALICYLATE LEVEL - Abnormal; Notable for the following components:   Salicylate Lvl [id number] (*)    All other components within normal limits  ETHANOL  URINALYSIS, ROUTINE W REFLEX MICROSCOPIC  RAPID URINE DRUG SCREEN, HOSP PERFORMED  BASIC METABOLIC PANEL  BASIC METABOLIC PANEL  BASIC METABOLIC PANEL  BASIC METABOLIC PANEL  BASIC METABOLIC PANEL  HEMOGLOBIN A1C  EKG None  Radiology CT ABDOMEN PELVIS WO CONTRAST  Result Date: 02/20/2021 CLINICAL DATA:  Abdominal pain. EXAM: CT ABDOMEN AND PELVIS WITHOUT CONTRAST TECHNIQUE: Multidetector CT imaging of the abdomen and pelvis was performed following the standard protocol without IV contrast. RADIATION DOSE REDUCTION: This exam was performed according to the departmental dose-optimization program which includes automated exposure control, adjustment of the mA and/or kV according to patient size and/or use of iterative reconstruction technique. COMPARISON:  CT dated 04/26/2017. FINDINGS: Evaluation of this exam is limited in the absence of intravenous contrast. Lower chest: Minimal bibasilar atelectasis. Small crescentic lucency adjacent to the descending thoracic aorta may represent small amount of pneumomediastinum or trapped air from prior pneumomediastinum. No intra-abdominal free air or free fluid. Hepatobiliary: No focal liver abnormality is seen. No gallstones, gallbladder wall thickening, or biliary dilatation. Pancreas: Unremarkable. No pancreatic ductal dilatation or surrounding inflammatory changes. Spleen: Normal in size without focal abnormality. Adrenals/Urinary Tract: The adrenal glands are unremarkable. The kidneys, visualized ureters, and urinary bladder appear unremarkable. Stomach/Bowel: Moderate stool throughout the colon. No bowel obstruction  or active inflammation. The appendix is normal. Vascular/Lymphatic: The abdominal aorta and IVC unremarkable on this noncontrast CT. No portal venous gas. There is no adenopathy. Reproductive: The prostate and seminal vesicles are grossly unremarkable. No pelvic mass. Other: None Musculoskeletal: No acute or significant osseous findings. IMPRESSION: 1. No acute intra-abdominal or pelvic pathology. 2. Moderate colonic stool burden. No bowel obstruction. Normal appendix. 3. Small crescentic lucency adjacent to the descending thoracic aorta may represent small amount of pneumomediastinum or trapped air from prior pneumomediastinum. Electronically Signed   By: Anner Crete M.D.   On: 02/20/2021 00:45   CT Head Wo Contrast  Result Date: 02/19/2021 CLINICAL DATA:  Mental status change EXAM: CT HEAD WITHOUT CONTRAST TECHNIQUE: Contiguous axial images were obtained from the base of the skull through the vertex without intravenous contrast. RADIATION DOSE REDUCTION: This exam was performed according to the departmental dose-optimization program which includes automated exposure control, adjustment of the mA and/or kV according to patient size and/or use of iterative reconstruction technique. COMPARISON:  CT brain 04/26/2017 FINDINGS: Brain: No evidence of acute infarction, hemorrhage, hydrocephalus, extra-axial collection or mass lesion/mass effect. Vascular: No hyperdense vessel or unexpected calcification. Skull: Normal. Negative for fracture or focal lesion. Sinuses/Orbits: Retention cyst in the right maxillary sinus. Chronic appearing right nasal bone deformity Other: None IMPRESSION: Negative non contrasted CT appearance of the brain Electronically Signed   By: Donavan Foil M.D.   On: 02/19/2021 23:09    Procedures .Critical Care Performed by: Marcello Fennel, PA-C Authorized by: Marcello Fennel, PA-C   Critical care provider statement:    Critical care time (minutes):  30   Critical care time  was exclusive of:  Separately billable procedures and treating other patients   Critical care was necessary to treat or prevent imminent or life-threatening deterioration of the following conditions:  Metabolic crisis   Critical care was time spent personally by me on the following activities:  Discussions with consultants, evaluation of patient's response to treatment, examination of patient, ordering and review of laboratory studies, ordering and review of radiographic studies, ordering and performing treatments and interventions, pulse oximetry, re-evaluation of patient's condition and review of old charts   I assumed direction of critical care for this patient from another provider in my specialty: no     Care discussed with: admitting provider      Medications Ordered in ED Medications  dextrose  5 % solution ( Intravenous New Bag/Given 02/20/21 0208)  lactated ringers bolus 1,000 mL (0 mLs Intravenous Stopped 02/20/21 0149)    ED Course/ Medical Decision Making/ A&P                           Medical Decision Making Amount and/or Complexity of Data Reviewed Labs: ordered. Radiology: ordered.  Risk Decision regarding hospitalization.   This patient presents to the ED for concern of altered mental status, this involves an extensive number of treatment options, and is a complaint that carries with it a high risk of complications and morbidity.  The differential diagnosis includes metabolic abnormality, CVA, psychiatric emergency,    Additional history obtained:  Additional history obtained from prison guard who is at bedside, electronic medical record External records from outside source obtained and reviewed including previous ED notes please see HPI    Co morbidities that complicate the patient evaluation  N/A  Social Determinants of Health:  Prisoner  Lab Tests:  I Ordered, and personally interpreted labs.  The pertinent results include: CBC shows leukocytosis, CMP  shows sodium 163, chloride 119 glucose 127 BUN of 38 creatinine 1.7, liver enzymes slightly elevated but appear to be at baseline, T bili 1.7 and gap 19 acetaminophen negative, salicylate, ethanol less than 10   Imaging Studies ordered:  I ordered imaging studies including CT head, CT abdomen pelvis I independently visualized and interpreted imaging which showed CT head negative acute findings, CT abdomen pelvis without contrast reveals small amount of air in the pneumomediastinum I agree with the radiologist interpretation   Cardiac Monitoring:  The patient was maintained on a cardiac monitor.  I personally viewed and interpreted the cardiac monitored which showed an underlying rhythm of: Sinus without signs of ischemia  Critical Interventions:  Patient is a critical sodium of 163 will provide patient with LR fluid and continue to monitor.   Reevaluation:  After the interventions noted above, I reevaluated the patient and found that they have :stayed the same  Noted the patient has elevated sodium 163 LR will be given, will continue to monitor.  On my exam patient was tender in his lower abdomen nonspecific, will obtain Noncon CT abdomen pelvis for rule out of infectious etiology.  CT ab pelvis negative for acute findings, will consult hospitalist for admission.    Consultations Obtained:  I requested consultation with the hospitalist Dr. Bridgett Larsson,  and discussed lab and imaging findings as well as pertinent plan - they recommend: He will admit the patient   Rule out Low suspicion for systemic infection as patient nontoxic-appearing, does not meet sepsis or SIRS criteria.  I have low suspicion for intracranial head bleed and or mass CT imaging is negative for acute findings.  I position for CVA as he is altered but there is no focal deficits, presentation atypical etiology.  Low suspicion for liver or gallbladder normality as he has no right upper quadrant tenderness, only has  mildly elevated liver enzymes as well as T bili and this appears to be his baseline.  I have low suspicion for bowel obstruction, bowel perforation, intra-abdominal infection, intra-abdominal mass CT imaging negative for these findings.  It is noted that he has a small amount of air trapped in his mediastinum, and this is very small, no abnormal lung sounds present my exam and or crepitus, likely this is chronic, he has had in the past in 2019 unclear etiology.    Dispostion  and problem list  After consideration of the diagnostic results and the patients response to treatment, I feel that the patent would benefit from admission  Altered mental status-likely secondary due to hyponatremia from failure to thrive.  Patient will provided fluids, will need continued monitoring in the hospital.             Final Clinical Impression(s) / ED Diagnoses Final diagnoses:  Altered mental status, unspecified altered mental status type  Acute hypernatremia    Rx / DC Orders ED Discharge Orders     None         Aron Baba [phone]    Delora Fuel, MD [id number]    Delora Fuel, MD 0000000 [phone]

## 2021-02-19 NOTE — ED Triage Notes (Signed)
Pt brought in for eval r/t not eating for a few days and losing weight from jail

## 2021-02-20 ENCOUNTER — Emergency Department (HOSPITAL_COMMUNITY)

## 2021-02-20 DIAGNOSIS — E870 Hyperosmolality and hypernatremia: Principal | ICD-10-CM

## 2021-02-20 DIAGNOSIS — Z833 Family history of diabetes mellitus: Secondary | ICD-10-CM | POA: Diagnosis not present

## 2021-02-20 DIAGNOSIS — N179 Acute kidney failure, unspecified: Secondary | ICD-10-CM | POA: Diagnosis present

## 2021-02-20 DIAGNOSIS — R9431 Abnormal electrocardiogram [ECG] [EKG]: Secondary | ICD-10-CM | POA: Diagnosis present

## 2021-02-20 DIAGNOSIS — Z6841 Body Mass Index (BMI) 40.0 and over, adult: Secondary | ICD-10-CM | POA: Diagnosis not present

## 2021-02-20 DIAGNOSIS — G9341 Metabolic encephalopathy: Secondary | ICD-10-CM

## 2021-02-20 DIAGNOSIS — F309 Manic episode, unspecified: Secondary | ICD-10-CM | POA: Diagnosis not present

## 2021-02-20 DIAGNOSIS — Z79899 Other long term (current) drug therapy: Secondary | ICD-10-CM | POA: Diagnosis not present

## 2021-02-20 DIAGNOSIS — E875 Hyperkalemia: Secondary | ICD-10-CM | POA: Diagnosis not present

## 2021-02-20 DIAGNOSIS — F3113 Bipolar disorder, current episode manic without psychotic features, severe: Secondary | ICD-10-CM | POA: Diagnosis present

## 2021-02-20 DIAGNOSIS — E860 Dehydration: Secondary | ICD-10-CM

## 2021-02-20 DIAGNOSIS — D696 Thrombocytopenia, unspecified: Secondary | ICD-10-CM | POA: Diagnosis present

## 2021-02-20 DIAGNOSIS — J982 Interstitial emphysema: Secondary | ICD-10-CM | POA: Diagnosis present

## 2021-02-20 DIAGNOSIS — E871 Hypo-osmolality and hyponatremia: Secondary | ICD-10-CM | POA: Diagnosis present

## 2021-02-20 DIAGNOSIS — R739 Hyperglycemia, unspecified: Secondary | ICD-10-CM | POA: Diagnosis present

## 2021-02-20 DIAGNOSIS — Z888 Allergy status to other drugs, medicaments and biological substances status: Secondary | ICD-10-CM | POA: Diagnosis not present

## 2021-02-20 DIAGNOSIS — E876 Hypokalemia: Secondary | ICD-10-CM | POA: Diagnosis not present

## 2021-02-20 DIAGNOSIS — D72829 Elevated white blood cell count, unspecified: Secondary | ICD-10-CM | POA: Diagnosis present

## 2021-02-20 LAB — BASIC METABOLIC PANEL
Anion gap: 8 (ref 5–15)
Anion gap: 13 (ref 5–15)
Anion gap: 8 (ref 5–15)
BUN: 35 mg/dL — ABNORMAL HIGH (ref 6–20)
BUN: 33 mg/dL — ABNORMAL HIGH (ref 6–20)
BUN: 31 mg/dL — ABNORMAL HIGH (ref 6–20)
CO2: 29 mmol/L (ref 22–32)
CO2: 28 mmol/L (ref 22–32)
CO2: 31 mmol/L (ref 22–32)
Calcium: 9.4 mg/dL (ref 8.9–10.3)
Calcium: 10.2 mg/dL (ref 8.9–10.3)
Calcium: 9.8 mg/dL (ref 8.9–10.3)
Chloride: 123 mmol/L — ABNORMAL HIGH (ref 98–111)
Chloride: 118 mmol/L — ABNORMAL HIGH (ref 98–111)
Chloride: 121 mmol/L — ABNORMAL HIGH (ref 98–111)
Creatinine, Ser: 1.49 mg/dL — ABNORMAL HIGH (ref 0.61–1.24)
Creatinine, Ser: 1.52 mg/dL — ABNORMAL HIGH (ref 0.61–1.24)
Creatinine, Ser: 1.45 mg/dL — ABNORMAL HIGH (ref 0.61–1.24)
GFR, Estimated: >60 mL/min (ref >60)
GFR, Estimated: >60 mL/min (ref >60)
GFR, Estimated: >60 mL/min (ref >60)
Glucose, Bld: 132 mg/dL — ABNORMAL HIGH (ref 70–99)
Glucose, Bld: 102 mg/dL — ABNORMAL HIGH (ref 70–99)
Glucose, Bld: 107 mg/dL — ABNORMAL HIGH (ref 70–99)
Potassium: 4 mmol/L (ref 3.5–5.1)
Potassium: 3.7 mmol/L (ref 3.5–5.1)
Potassium: 3.8 mmol/L (ref 3.5–5.1)
Sodium: 160 mmol/L — ABNORMAL HIGH (ref 135–145)
Sodium: 159 mmol/L — ABNORMAL HIGH (ref 135–145)
Sodium: 160 mmol/L — ABNORMAL HIGH (ref 135–145)

## 2021-02-20 LAB — URINALYSIS, ROUTINE W REFLEX MICROSCOPIC
Bacteria, UA: NONE SEEN
Bilirubin Urine: NEGATIVE
Glucose, UA: NEGATIVE mg/dL
Ketones, ur: 5 mg/dL — AB
Leukocytes,Ua: NEGATIVE
Nitrite: NEGATIVE
Protein, ur: 30 mg/dL — AB
Specific Gravity, Urine: 1.029 (ref 1.005–1.030)
pH: 5 (ref 5.0–8.0)

## 2021-02-20 LAB — HEMOGLOBIN A1C
Hgb A1c MFr Bld: 5.9 % — ABNORMAL HIGH (ref 4.8–5.6)
Mean Plasma Glucose: 123 mg/dL

## 2021-02-20 LAB — LITHIUM LEVEL: Lithium Lvl: <0.06 mmol/L — ABNORMAL LOW (ref 0.60–1.20)

## 2021-02-20 LAB — CBG MONITORING, ED
Glucose-Capillary: 105 mg/dL — ABNORMAL HIGH (ref 70–99)
Glucose-Capillary: 102 mg/dL — ABNORMAL HIGH (ref 70–99)

## 2021-02-20 LAB — RAPID URINE DRUG SCREEN, HOSP PERFORMED
Amphetamines: NOT DETECTED
Barbiturates: NOT DETECTED
Benzodiazepines: NOT DETECTED
Cocaine: NOT DETECTED
Opiates: NOT DETECTED
Tetrahydrocannabinol: NOT DETECTED

## 2021-02-20 LAB — CK: Total CK: 1083 U/L — ABNORMAL HIGH (ref 49–397)

## 2021-02-20 LAB — HIV ANTIBODY (ROUTINE TESTING W REFLEX): HIV Screen 4th Generation wRfx: NONREACTIVE

## 2021-02-20 MED ORDER — HEPARIN SODIUM (PORCINE) 5000 UNIT/ML IJ SOLN
5000.0000 [IU] | Freq: Three times a day (TID) | INTRAMUSCULAR | Status: DC
  Administered 2021-02-20 – 2021-02-24 (×13): 5000 [IU] via SUBCUTANEOUS
  Filled 2021-02-20 (×13): qty 1

## 2021-02-20 MED ORDER — ACETAMINOPHEN 650 MG RE SUPP: 650.0000 mg | RECTAL | Status: DC | PRN

## 2021-02-20 MED ORDER — LORAZEPAM 2 MG/ML IJ SOLN
1.0000 mg | INTRAMUSCULAR | Status: DC | PRN
  Administered 2021-02-20: 1 mg via INTRAVENOUS
  Filled 2021-02-20: qty 1

## 2021-02-20 MED ORDER — DEXTROSE 5 % IV SOLN: INTRAVENOUS | Status: DC

## 2021-02-20 MED ORDER — LORAZEPAM 2 MG/ML IJ SOLN
2.0000 mg | INTRAMUSCULAR | Status: DC | PRN
  Administered 2021-02-20 – 2021-02-23 (×4): 2 mg via INTRAVENOUS
  Filled 2021-02-20 (×4): qty 1

## 2021-02-20 MED ORDER — LACTATED RINGERS IV BOLUS
1000.0000 mL | Freq: Once | INTRAVENOUS | Status: AC
  Administered 2021-02-20: 1000 mL via INTRAVENOUS

## 2021-02-20 MED ORDER — DEXTROSE 10 % IV SOLN: INTRAVENOUS | Status: DC

## 2021-02-20 MED ORDER — ONDANSETRON HCL 4 MG/2ML IJ SOLN: 4.0000 mg | Freq: Four times a day (QID) | INTRAMUSCULAR | Status: DC | PRN

## 2021-02-20 NOTE — ED Notes (Signed)
Vitals have not been updated due to pts aggression earlier in shift.

## 2021-02-20 NOTE — ED Notes (Signed)
This RN assessed pt's L IV where D5 was running in and noticed IV infiltrated. D5 moved to R IV and L IV removed, hot packed applied.

## 2021-02-20 NOTE — Assessment & Plan Note (Signed)
Due to poor po intake from his mental illness.

## 2021-02-20 NOTE — Hospital Course (Signed)
Ruari Mudgett is on 31 year old male with past psychiatric history of bipolar, mania, NMS presented to ED from [place] jail on 02/19/2021 for altered mental status, not eating and drinking well psych consult placed for mania with history of NMS.  Patient was seen in the ED multiple times for agitation with most recent on 01/05/2021.  At that time he was agitated, psychotic stated relationship with God and he was recommended to place back on antipsychotics. Patient had been on multiple meds in the past including lithium, Depakote, Haldol, Clozaril , Zyprexa, clonidine, Ativan, propanolol.   Currently, patient is not on any psych meds except hydroxyzine 50 mg 3 times daily for agitation.  ED course : Patient was found to be hyperglycemic with sodium of 163, creatinine 1.49, WBC 12.9, ethanol <10, acetaminophen<10 salicylate levels <7, alcohol CT head negative. EKG QTC 542.  QTc on 12/09/03/2010.  Current medications include lorazepam 1 mg every 4 as needed.

## 2021-02-20 NOTE — Assessment & Plan Note (Signed)
Due to hypernatremia. Pt has pinpoint pupils. Have not rule out drug overdose. I&O cath for UA and UDS.

## 2021-02-20 NOTE — ED Notes (Signed)
Attempted to pull bloodwork from R IV, unable to pull back. Phlebotomy notified.

## 2021-02-20 NOTE — Subjective & Objective (Signed)
CC: altered mental status HPI: 31 year old African-American male with a history of mental illness, bipolar disorder who was brought over here from the [facility] jail.  Reportedly patient's been acting abnormal for the last several days.  Has not been eating or drinking.  Reportedly has been losing weight.  The jailer that is with him does not have any information regarding the patient.  No med this was sent with the patient.  Laboratory evaluation demonstrated severe hypernatremia with a serum sodium of 163.  BUN of 38, creatinine 1.7  Hemoglobin of 16.5  CT head was negative.  CT abdomen pelvis demonstrated prior pneumomediastinum.  Patient had colonic stool burden.  Patient unable to give any history review of systems due to his acute metabolic encephalopathy.  [facility] hospitalist contacted for admission.

## 2021-02-20 NOTE — Consult Note (Addendum)
[facility] Face-to-Face Psychiatry Consult   Reason for Consult: Mania, history of NMS Referring Physician:  Dr Vance Gather.  Patient Identification: Brandon Fuentes MRN:  [id number] Principal Diagnosis: Hypernatremia Diagnosis:  Principal Problem:   Hypernatremia Active Problems:   Bipolar I disorder, current or most recent episode manic, severe (HCC)   Pneumomediastinum (HCC)   Dehydration   BMI 45.0-49.9, adult (HCC)   Acute metabolic encephalopathy   AKI (acute kidney injury) ([place])   Total Time spent with patient: 20 minutes  HPI:   Brandon Fuentes is a 31 y.o. male patient with past psychiatric history of bipolar rhabdomyolysis, and NMS (2/2 antipsychotics) presented to ED from [place] jail for not communicating well, not eating and drinking well.  Patient found to have hypernatremia with sodium 163 and AKI with creatinine 1.7.  Psych consult placed for mania with history of NMS.    Subjective: Patient seen briefly this morning and later with Dr. Dwyane Dee. This morning, patient was combative, agitated and aggressive with all staff members.  He was in b/l handcuffs and chains to his b/l legs.  Patient disrespectful to staff and calling nursing staff "bitches" and asked everyone to "fuck off".  He was uncooperative and did not want to answer any orientation questions.  He denied SI.  When nursing staff asked him about HI and AVH , he asked them back " Do you want to hurt other people?',  "Do you see things which are not there?". Later today, patient was seen with Dr. Dwyane Dee. Pt was sleeping.   Information obtained by chart review- Patient had been on multiple medications including antipsychotics and mood stabilizers in the past (Haldol, lithium, Depakote, olanzapine, Geodon, risperidone, Clozaril,, clonidine, Ativan, propanolol) .  Patient has multiple presentation to ED for episodes of agitation and psychosis.  He was last presented to the ED for agitation and psychosis on 01/05/2021.  His last psych  hospitalization was on 02/05/2019 at [facility] for mania.  He was discharged on Clozaril, Zyprexa Zydis, lithium, clonidine, Ativan, propanolol. Per EMR, Pt was taken off all psych meds due to NMS in Oct 2022. Pt's current outpatient meds includes Hydroxyzine 50 mg TID as needed for agitation.  Patient also has allergies due to H/o NMS  to-Haldol, olanzapine, Depakote, Geodon, risperidone, Zoloft, atomoxetine.  Mental status exam could not be completed as patient was uncooperative.  Labs reviewed : Sodium 160, creatinine 1.49, UDS negative, ethanol less than 10, Salicylate < 7, 123456, WBCs 12.9, CT head negative. EKG QTc 542 (past 12/8- 512)  Recommendations -Mental status exam could not be completed today as patient was uncooperative. We will see patient again tomorrow.  -Recommend medical management of Encephalopathy including treatment of hyper natremia .  -Order CK level.  -Avoid Antipsychotics due to h/o NMS and prolonged QTc. (EKG QTc 542 ).  -We will consider adding mood stabilizer possibly Trileptal or Tegretol when patient is medically stable. Pt has allergies due to H/o NMS  to-Haldol, olanzapine, Depakote, Geodon, risperidone, Zoloft, atomoxetine.  Thank you for this psych consult.  Psychiatry will continue to follow. Past Psychiatric History: Bipolar, and NMS, rhabdomyolysis  See Chart review in H&P for details.  Risk to Self:  No Risk to Others:  Yes Prior Inpatient Therapy:  Yes Prior Outpatient Therapy:    Past Medical History:  Past Medical History:  Diagnosis Date   Bipolar 1 disorder ([place])    No past surgical history on file. Family History:  Family History  Problem Relation Age of  Onset   Diabetes Mother    Family Psychiatric  History:  Social History:  Social History   Substance and Sexual Activity  Alcohol Use No     Social History   Substance and Sexual Activity  Drug Use No    Social History   Socioeconomic History   Marital  status: Single    Spouse name: Not on file   Number of children: Not on file   Years of education: Not on file   Highest education level: Not on file  Occupational History   Not on file  Tobacco Use   Smoking status: Never   Smokeless tobacco: Never  Vaping Use   Vaping Use: Never used  Substance and Sexual Activity   Alcohol use: No   Drug use: No   Sexual activity: Yes    Birth control/protection: None  Other Topics Concern   Not on file  Social History Narrative   Not on file   Social Determinants of Health   Financial Resource Strain: Not on file  Food Insecurity: Not on file  Transportation Needs: Not on file  Physical Activity: Not on file  Stress: Not on file  Social Connections: Not on file   Additional Social History:    Allergies:   Allergies  Allergen Reactions   Atomoxetine Other (See Comments)    Increased agitation, due to Neuroleptic malignant syndrome (NMS)    Haloperidol Lactate Other (See Comments)    Per mother--caused NMS neuroleptic malignant syndrome   Olanzapine Other (See Comments)    Neuroleptic malignant syndrome (NMS) after use   Valproic Acid Other (See Comments)    Cannot take, due to Neuroleptic malignant syndrome (NMS)     Chlorpromazine Other (See Comments)    Drug-induced liver injury   Depakote [Divalproex Sodium] Other (See Comments)    Sleepiness, anger (Patient has Neuroleptic malignant syndrome- NMS)    Geodon [Ziprasidone Hcl] Other (See Comments)    Per mom, patient gets very agitated   Haloperidol Other (See Comments)    Per mother--caused NMS neuroleptic malignant syndrome   Ketorolac Other (See Comments)    Cannot take, due to Neuroleptic malignant syndrome (NMS)   Risperidone     Per mother caused Neuroleptic malignant syndrome   Risperidone And Related Other (See Comments)    Per mother caused Neuroleptic malignant syndrome   Zoloft [Sertraline Hcl] Other (See Comments)    Anger (patient has Neuroleptic  malignant syndrome (NMS))    Labs:  Results for orders placed or performed during the hospital encounter of 02/19/21 (from the past 48 hour(s))  Comprehensive metabolic panel     Status: Abnormal   Collection Time: 02/19/21 10:29 PM  Result Value Ref Range   Sodium 163 (HH) 135 - 145 mmol/L    Comment: CRITICAL RESULT CALLED TO, READ BACK BY AND VERIFIED WITH: M Sharp,RN 02/19/2021 Perlie Mayo 2358    Potassium 3.8 3.5 - 5.1 mmol/L   Chloride 119 (H) 98 - 111 mmol/L   CO2 25 22 - 32 mmol/L   Glucose, Bld 127 (H) 70 - 99 mg/dL    Comment: Glucose reference range applies only to samples taken after fasting for at least 8 hours.   BUN 38 (H) 6 - 20 mg/dL   Creatinine, Ser 1.70 (H) 0.61 - 1.24 mg/dL   Calcium 10.2 8.9 - 10.3 mg/dL   Total Protein 8.1 6.5 - 8.1 g/dL   Albumin 4.4 3.5 - 5.0 g/dL   AST 68 (H) 15 -  41 U/L   ALT 67 (H) 0 - 44 U/L   Alkaline Phosphatase 54 38 - 126 U/L   Total Bilirubin 1.7 (H) 0.3 - 1.2 mg/dL   GFR, Estimated 56 (L) >60 mL/min    Comment: (NOTE) Calculated using the CKD-EPI Creatinine Equation (2021)    Anion gap 19 (H) 5 - 15    Comment: Performed at [facility] [address] [place], [place] [address]  CBC with Differential     Status: Abnormal   Collection Time: 02/19/21 10:29 PM  Result Value Ref Range   WBC 12.9 (H) 4.0 - 10.5 K/uL   RBC 6.18 (H) 4.22 - 5.81 MIL/uL   Hemoglobin 16.5 13.0 - 17.0 g/dL   HCT 53.0 (H) 39.0 - 52.0 %   MCV 85.8 80.0 - 100.0 fL   MCH 26.7 26.0 - 34.0 pg   MCHC 31.1 30.0 - 36.0 g/dL   RDW 18.6 (H) 11.5 - 15.5 %   Platelets 126 (L) 150 - 400 K/uL    Comment: REPEATED TO VERIFY   nRBC 0.0 0.0 - 0.2 %   Neutrophils Relative % 66 %   Neutro Abs 8.6 (H) 1.7 - 7.7 K/uL   Lymphocytes Relative 23 %   Lymphs Abs 2.9 0.7 - 4.0 K/uL   Monocytes Relative 9 %   Monocytes Absolute 1.2 (H) 0.1 - 1.0 K/uL   Eosinophils Relative 1 %   Eosinophils Absolute 0.2 0.0 - 0.5 K/uL   Basophils Relative 1 %   Basophils  Absolute 0.1 0.0 - 0.1 K/uL   Immature Granulocytes 0 %   Abs Immature Granulocytes 0.02 0.00 - 0.07 K/uL    Comment: Performed at [facility], [place] [address], [place], [place] [address]  Acetaminophen level     Status: Abnormal   Collection Time: 02/19/21 10:29 PM  Result Value Ref Range   Acetaminophen (Tylenol), Serum <10 (L) 10 - 30 ug/mL    Comment: (NOTE) Therapeutic concentrations vary significantly. A range of 10-30 ug/mL  may be an effective concentration for many patients. However, some  are best treated at concentrations outside of this range. Acetaminophen concentrations >150 ug/mL at 4 hours after ingestion  and >50 ug/mL at 12 hours after ingestion are often associated with  toxic reactions.  Performed at [facility], [place] [address], [place], [place] [id number]   Salicylate level     Status: Abnormal   Collection Time: 02/19/21 10:29 PM  Result Value Ref Range   Salicylate Lvl Q000111Q (L) 7.0 - 30.0 mg/dL    Comment: Performed at [facility] [address] [place], [place] [address]  Ethanol     Status: None   Collection Time: 02/19/21 10:29 PM  Result Value Ref Range   Alcohol, Ethyl (B) <10 <10 mg/dL    Comment: (NOTE) Lowest detectable limit for serum alcohol is 10 mg/dL.  For medical purposes only. Performed at [facility], [place] [address] [place], [place] [address]   Urinalysis, Routine w reflex microscopic Urine, In & Out Cath     Status: Abnormal   Collection Time: 02/20/21  6:00 AM  Result Value Ref Range   Color, Urine AMBER (A) YELLOW    Comment: BIOCHEMICALS MAY BE AFFECTED BY COLOR   APPearance CLOUDY (A) CLEAR   Specific Gravity, Urine 1.029 1.005 - 1.030   pH 5.0 5.0 - 8.0   Glucose, UA NEGATIVE NEGATIVE mg/dL   Hgb urine dipstick  SMALL (A) NEGATIVE   Bilirubin Urine NEGATIVE NEGATIVE   Ketones, ur 5 (A) NEGATIVE mg/dL   Protein, ur 30 (A) NEGATIVE mg/dL   Nitrite NEGATIVE NEGATIVE   Leukocytes,Ua NEGATIVE NEGATIVE    RBC / HPF 0-5 0 - 5 RBC/hpf   Bacteria, UA NONE SEEN NONE SEEN   Squamous Epithelial / LPF 0-5 0 - 5   Mucus PRESENT    Hyaline Casts, UA PRESENT    Uric Acid Crys, UA PRESENT     Comment: Performed at [facility] [address] [place], [place] [address]  Urine rapid drug screen (hosp performed)     Status: None   Collection Time: 02/20/21  6:00 AM  Result Value Ref Range   Opiates NONE DETECTED NONE DETECTED   Cocaine NONE DETECTED NONE DETECTED   Benzodiazepines NONE DETECTED NONE DETECTED   Amphetamines NONE DETECTED NONE DETECTED   Tetrahydrocannabinol NONE DETECTED NONE DETECTED   Barbiturates NONE DETECTED NONE DETECTED    Comment: (NOTE) DRUG SCREEN FOR MEDICAL PURPOSES ONLY.  IF CONFIRMATION IS NEEDED FOR ANY PURPOSE, NOTIFY LAB WITHIN 5 DAYS.  LOWEST DETECTABLE LIMITS FOR URINE DRUG SCREEN Drug Class                     Cutoff (ng/mL) Amphetamine and metabolites    1000 Barbiturate and metabolites    200 Benzodiazepine                 [id number] Tricyclics and metabolites     300 Opiates and metabolites        300 Cocaine and metabolites        300 THC                            50 Performed at [facility], [place] [address], [place], [place] [id number]   Basic metabolic panel     Status: Abnormal   Collection Time: 02/20/21  6:14 AM  Result Value Ref Range   Sodium 160 (H) 135 - 145 mmol/L   Potassium 4.0 3.5 - 5.1 mmol/L   Chloride 123 (H) 98 - 111 mmol/L   CO2 29 22 - 32 mmol/L   Glucose, Bld 132 (H) 70 - 99 mg/dL    Comment: Glucose reference range applies only to samples taken after fasting for at least 8 hours.   BUN 35 (H) 6 - 20 mg/dL   Creatinine, Ser 1.49 (H) 0.61 - 1.24 mg/dL   Calcium 9.4 8.9 - 10.3 mg/dL   GFR, Estimated >60 >60 mL/min    Comment: (NOTE) Calculated using the CKD-EPI Creatinine Equation (2021)    Anion gap 8 5 - 15    Comment: Performed at [facility] [address] [place], [place] [address]  HIV Antibody  (routine testing w rflx)     Status: None   Collection Time: 02/20/21  6:14 AM  Result Value Ref Range   HIV Screen 4th Generation wRfx Non Reactive Non Reactive    Comment: Performed at [facility], [place] [address] [place], [place] [address]  CBG monitoring, ED     Status: Abnormal   Collection Time: 02/20/21 10:30 AM  Result Value Ref Range   Glucose-Capillary 105 (H) 70 - 99 mg/dL    Comment: Glucose reference range applies only to samples taken after fasting for at least 8 hours.  Basic metabolic panel  Status: Abnormal   Collection Time: 02/20/21 10:55 AM  Result Value Ref Range   Sodium 159 (H) 135 - 145 mmol/L   Potassium 3.7 3.5 - 5.1 mmol/L   Chloride 118 (H) 98 - 111 mmol/L   CO2 28 22 - 32 mmol/L   Glucose, Bld 102 (H) 70 - 99 mg/dL    Comment: Glucose reference range applies only to samples taken after fasting for at least 8 hours.   BUN 33 (H) 6 - 20 mg/dL   Creatinine, Ser 1.52 (H) 0.61 - 1.24 mg/dL   Calcium 10.2 8.9 - 10.3 mg/dL   GFR, Estimated >60 >60 mL/min    Comment: (NOTE) Calculated using the CKD-EPI Creatinine Equation (2021)    Anion gap 13 5 - 15    Comment: Performed at [facility] [address], [place], [place] [address]  Lithium level     Status: Abnormal   Collection Time: 02/20/21 11:08 AM  Result Value Ref Range   Lithium Lvl <0.06 (L) 0.60 - 1.20 mmol/L    Comment: Performed at [facility] [address] [place], [place] [address]  CBG monitoring, ED     Status: Abnormal   Collection Time: 02/20/21 12:40 PM  Result Value Ref Range   Glucose-Capillary 102 (H) 70 - 99 mg/dL    Comment: Glucose reference range applies only to samples taken after fasting for at least 8 hours.    Current Facility-Administered Medications  Medication Dose Route Frequency Provider Last Rate Last Admin   acetaminophen (TYLENOL) suppository 650 mg  650 mg Rectal Q4H PRN Kristopher Oppenheim, DO       dextrose 5 % solution   Intravenous Continuous  Kristopher Oppenheim, DO 150 mL/hr at 02/20/21 1011 New Bag at 02/20/21 1011   heparin injection 5,000 Units  5,000 Units Subcutaneous Q8H Kristopher Oppenheim, DO   5,000 Units at 02/20/21 1412   LORazepam (ATIVAN) injection 2 mg  2 mg Intravenous Q4H PRN Patrecia Pour, MD   2 mg at 02/20/21 0925   ondansetron (ZOFRAN) injection 4 mg  4 mg Intravenous Q6H PRN Kristopher Oppenheim, DO       Current Outpatient Medications  Medication Sig Dispense Refill   hydrOXYzine (ATARAX/VISTARIL) 50 MG tablet Take 1 tablet (50 mg total) by mouth 3 (three) times daily as needed (agitation). (Patient taking differently: Take 50 mg by mouth 3 (three) times daily.) 30 tablet 0   feeding supplement, ENSURE ENLIVE, (ENSURE ENLIVE) LIQD Take 237 mLs by mouth 2 (two) times daily between meals. (Patient not taking: Reported on 02/20/2021) 237 mL 12    Musculoskeletal: Strength & Muscle Tone:  Moving all 4 limbs. Uncooperative Gait & Station:  deferred Patient leans: Backward            Psychiatric Specialty Exam:  Presentation  General Appearance: Disheveled (in handcuffs in both hands and chains in b/l legs.)  Eye Contact:Fleeting  Speech:-- (Mumbling)  Speech Volume:Increased  Handedness:Right   Mood and Affect  Mood:Angry; Irritable  Affect:Congruent   Thought Process  Thought Processes:-- (Not able to test. Pt uncooperative)  Descriptions of Associations:Tangential  Orientation:-- (Not able to test. Pt uncooperative)  Thought Content:Tangential  History of Schizophrenia/Schizoaffective disorder:No data recorded Duration of Psychotic Symptoms:Less than six months  Hallucinations:Hallucinations: -- (Not able to test. Pt uncooperative.)  Ideas of Reference:-- (Not able to test. Pt uncooperative)  Suicidal Thoughts:Suicidal Thoughts: No  Homicidal Thoughts:Homicidal Thoughts: -- (Didn't answer. Pt uncooperative)   Sensorium  Memory:Immediate Fair; Remote Fair; Recent  Fair  Judgment:Poor  Insight:Lacking   Executive Functions  Concentration:Poor  Attention Span:Poor  Recall:Poor  [employer] of Knowledge:Poor  Language:Poor   Psychomotor Activity  Psychomotor Activity:Psychomotor Activity: Increased  Assets  Assets:Financial Resources/Insurance; Housing; Physical Health; Resilience; Social Support   Sleep  Sleep:No data recorded  Physical Exam: Physical Exam Vitals reviewed.  Constitutional:      General: He is in acute distress.  Pulmonary:     Effort: Pulmonary effort is normal.  Neurological:     Comments: uncooperative   Review of Systems  Reason unable to perform ROS: Pt Uncooperative.  Blood pressure 126/89, pulse 85, temperature 98.1 F (36.7 C), temperature source Axillary, resp. rate 20, SpO2 95 %. There is no height or weight on file to calculate BMI.  Armando Reichert, MD 02/20/2021 3:22 PM

## 2021-02-20 NOTE — Progress Notes (Signed)
PROGRESS NOTE  Brief Narrative: Brandon Fuentes is a 31 y.o. 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 He appeared dehydrated and altered, with AKI (SCr 1.7, BUN 38) and hypernatremia (Na 163). CT head was negative. CT abd/pelvis revealed moderate colonic stool burden without obstruction or other acute intraabdominal or pelvic pathology. There was mention of small lucency adjacent to thoracic aorta suspected to be due to trapped air from prior pneumomediastinum. IV fluid was started to correct free water deficit and the patient was admitted earlier this morning. He has become much more aggressive, given ativan, and psychiatry consulted.   Subjective: Loudly speaking without direct answers to questions with pressured speech and swung at RN earlier.  Objective: BP (!) 142/103    Pulse 77    Temp 98.1 F (36.7 C) (Axillary)    Resp 17    SpO2 96%   Patient is now floridly manic, speaking with pressured hyperreligious speech making threats. This limited examination though he appeared to breathe comfortable, HR on monitor in 80's, no definite edema or abdominal distention. Moves all extremities. No open wounds/bleeding on limited visualization.    Assessment & Plan: Acute metabolic encephalopathy: UDS, salicylate and EtOH were negative. - Correct metabolic derangements as below - Continue precautions to maintain patient and staff safety  Hypernatremia, AKI, dehydration: It's possible the patient's behavioral issues led to poor hydration or that dehydration worsened metabolic parameters leading to encephalopathy. 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  - Check lithium level - Continue water through IV at current rate. Rate of correction is ok, will continue serial BMP.    Mania in bipolar 1 disorder:  - Avoid antipsychotics with NMS history.  - Ativan IV prn ordered - I appreciate psychiatry evaluation  Pneumomediastinum: Chronic, noted 2019, appears to be resolving. Not an active issue.  Morbid obesity:  - Weigh patient when able to do so safely.  Leukocytosis: Unclear etiology, though there is evidence of hemoconcentration. Without fever or meningismus or other localizing signs/symptoms, will monitor off abx for now.  - Check UA  Thrombocytopenia:  - Monitor. No bleeding currently  Tyrone Nine, MD Pager on [facility] 02/20/2021, 10:47 AM

## 2021-02-20 NOTE — Assessment & Plan Note (Signed)
Chronic. No list of meds from jail sent.

## 2021-02-20 NOTE — H&P (Signed)
History and Physical    Brandon Fuentes GNO:[id number] DOB: 1990-07-05 DOA: 02/19/2021  PCP: Patient, No Pcp Per (Inactive)   Patient coming from:  [facility]  I have personally briefly reviewed patient's old medical records in [facility]  CC: altered mental status HPI: 31 year old African-American male with a history of mental illness, bipolar disorder who was brought over here from the [facility] jail.  Reportedly patient's been acting abnormal for the last several days.  Has not been eating or drinking.  Reportedly has been losing weight.  The jailer that is with him does not have any information regarding the patient.  No med this was sent with the patient.  Laboratory evaluation demonstrated severe hypernatremia with a serum sodium of 163.  BUN of 38, creatinine 1.7  Hemoglobin of 16.5  CT head was negative.  CT abdomen pelvis demonstrated prior pneumomediastinum.  Patient had colonic stool burden.  Patient unable to give any history review of systems due to his acute metabolic encephalopathy.  [facility] hospitalist contacted for admission.   ED Course: sodium 163. CT head negative. CT abd shows old pneumomediastinum.  Review of Systems:  Review of Systems  Unable to perform ROS: Patient unresponsive   Past Medical History:  Diagnosis Date   Bipolar 1 disorder (HCC)     No past surgical history on file.   reports that he has never smoked. He has never used smokeless tobacco. He reports that he does not drink alcohol and does not use drugs.  Allergies  Allergen Reactions   Atomoxetine Other (See Comments)    Increased agitation, due to Neuroleptic malignant syndrome (NMS)    Haloperidol Lactate Other (See Comments)    Per mother--caused NMS neuroleptic malignant syndrome   Olanzapine Other (See Comments)    Neuroleptic malignant syndrome (NMS) after use   Valproic Acid Other (See Comments)    Cannot take, due to Neuroleptic malignant  syndrome (NMS)     Chlorpromazine Other (See Comments)    Drug-induced liver injury   Depakote [Divalproex Sodium] Other (See Comments)    Sleepiness, anger (Patient has Neuroleptic malignant syndrome- NMS)    Geodon [Ziprasidone Hcl] Other (See Comments)    Per mom, patient gets very agitated   Haloperidol Other (See Comments)    Per mother--caused NMS neuroleptic malignant syndrome   Ketorolac Other (See Comments)    Cannot take, due to Neuroleptic malignant syndrome (NMS)   Risperidone     Per mother caused Neuroleptic malignant syndrome   Risperidone And Related Other (See Comments)    Per mother caused Neuroleptic malignant syndrome   Zoloft [Sertraline Hcl] Other (See Comments)    Anger (patient has Neuroleptic malignant syndrome (NMS))    Family History  Problem Relation Age of Onset   Diabetes Mother     Prior to Admission medications   Medication Sig Start Date End Date Taking? Authorizing Provider  hydrOXYzine (ATARAX/VISTARIL) 50 MG tablet Take 1 tablet (50 mg total) by mouth 3 (three) times daily as needed (agitation). Patient taking differently: Take 50 mg by mouth 3 (three) times daily. 06/19/17  Yes Rolly Salter, MD  feeding supplement, ENSURE ENLIVE, (ENSURE ENLIVE) LIQD Take 237 mLs by mouth 2 (two) times daily between meals. Patient not taking: Reported on 02/20/2021 06/19/17   Rolly Salter, MD    Physical Exam: Vitals:   02/19/21 2203 02/20/21 0105  BP: 135/85 113/73  Pulse: 84 66  Resp: 18 20  SpO2: 98% 100%  Physical Exam Vitals and nursing note reviewed.  Constitutional:      General: He is not in acute distress.    Appearance: He is not toxic-appearing or diaphoretic.  HENT:     Head: Normocephalic and atraumatic.     Mouth/Throat:     Comments: Mouth is dry Eyes:     Comments: Pupils were pinpoint  Cardiovascular:     Rate and Rhythm: Normal rate and regular rhythm.     Pulses: Normal pulses.  Pulmonary:     Effort: Pulmonary  effort is normal. No respiratory distress.     Breath sounds: No wheezing or rales.  Abdominal:     General: There is no distension.     Tenderness: There is no abdominal tenderness. There is no guarding.  Musculoskeletal:     Right lower leg: No edema.     Left lower leg: No edema.  Skin:    General: Skin is warm and dry.     Capillary Refill: Capillary refill takes less than 2 seconds.  Neurological:     Comments: unresponsive     Labs on Admission: I have personally reviewed following labs and imaging studies  CBC: Recent Labs  Lab 02/19/21 2229  WBC 12.9*  NEUTROABS 8.6*  HGB 16.5  HCT 53.0*  MCV 85.8  PLT 126*   Basic Metabolic Panel: Recent Labs  Lab 02/19/21 2229  NA 163*  K 3.8  CL 119*  CO2 25  GLUCOSE 127*  BUN 38*  CREATININE 1.70*  CALCIUM 10.2   GFR: CrCl cannot be calculated (Unknown ideal weight.). Liver Function Tests: Recent Labs  Lab 02/19/21 2229  AST 68*  ALT 67*  ALKPHOS 54  BILITOT 1.7*  PROT 8.1  ALBUMIN 4.4   No results for input(s): LIPASE, AMYLASE in the last 168 hours. No results for input(s): AMMONIA in the last 168 hours. Coagulation Profile: No results for input(s): INR, PROTIME in the last 168 hours. Cardiac Enzymes: No results for input(s): CKTOTAL, CKMB, CKMBINDEX, TROPONINI in the last 168 hours. BNP (last 3 results) No results for input(s): PROBNP in the last 8760 hours. HbA1C: No results for input(s): HGBA1C in the last 72 hours. CBG: No results for input(s): GLUCAP in the last 168 hours. Lipid Profile: No results for input(s): CHOL, HDL, LDLCALC, TRIG, CHOLHDL, LDLDIRECT in the last 72 hours. Thyroid Function Tests: No results for input(s): TSH, T4TOTAL, FREET4, T3FREE, THYROIDAB in the last 72 hours. Anemia Panel: No results for input(s): VITAMINB12, FOLATE, FERRITIN, TIBC, IRON, RETICCTPCT in the last 72 hours. Urine analysis:    Component Value Date/Time   COLORURINE YELLOW 06/17/2017 1747    APPEARANCEUR CLEAR 06/17/2017 1747   LABSPEC 1.034 (H) 06/17/2017 1747   PHURINE 5.0 06/17/2017 1747   GLUCOSEU NEGATIVE 06/17/2017 1747   HGBUR NEGATIVE 06/17/2017 1747   BILIRUBINUR SMALL (A) 06/17/2017 1747   KETONESUR 80 (A) 06/17/2017 1747   PROTEINUR 100 (A) 06/17/2017 1747   NITRITE NEGATIVE 06/17/2017 1747   LEUKOCYTESUR NEGATIVE 06/17/2017 1747    Radiological Exams on Admission: I have personally reviewed images CT ABDOMEN PELVIS WO CONTRAST  Result Date: 02/20/2021 CLINICAL DATA:  Abdominal pain. EXAM: CT ABDOMEN AND PELVIS WITHOUT CONTRAST TECHNIQUE: Multidetector CT imaging of the abdomen and pelvis was performed following the standard protocol without IV contrast. RADIATION DOSE REDUCTION: This exam was performed according to the departmental dose-optimization program which includes automated exposure control, adjustment of the mA and/or kV according to patient size and/or use of iterative  reconstruction technique. COMPARISON:  CT dated 04/26/2017. FINDINGS: Evaluation of this exam is limited in the absence of intravenous contrast. Lower chest: Minimal bibasilar atelectasis. Small crescentic lucency adjacent to the descending thoracic aorta may represent small amount of pneumomediastinum or trapped air from prior pneumomediastinum. No intra-abdominal free air or free fluid. Hepatobiliary: No focal liver abnormality is seen. No gallstones, gallbladder wall thickening, or biliary dilatation. Pancreas: Unremarkable. No pancreatic ductal dilatation or surrounding inflammatory changes. Spleen: Normal in size without focal abnormality. Adrenals/Urinary Tract: The adrenal glands are unremarkable. The kidneys, visualized ureters, and urinary bladder appear unremarkable. Stomach/Bowel: Moderate stool throughout the colon. No bowel obstruction or active inflammation. The appendix is normal. Vascular/Lymphatic: The abdominal aorta and IVC unremarkable on this noncontrast CT. No portal venous gas.  There is no adenopathy. Reproductive: The prostate and seminal vesicles are grossly unremarkable. No pelvic mass. Other: None Musculoskeletal: No acute or significant osseous findings. IMPRESSION: 1. No acute intra-abdominal or pelvic pathology. 2. Moderate colonic stool burden. No bowel obstruction. Normal appendix. 3. Small crescentic lucency adjacent to the descending thoracic aorta may represent small amount of pneumomediastinum or trapped air from prior pneumomediastinum. Electronically Signed   By: Elgie CollardArash  Radparvar M.D.   On: 02/20/2021 00:45   CT Head Wo Contrast  Result Date: 02/19/2021 CLINICAL DATA:  Mental status change EXAM: CT HEAD WITHOUT CONTRAST TECHNIQUE: Contiguous axial images were obtained from the base of the skull through the vertex without intravenous contrast. RADIATION DOSE REDUCTION: This exam was performed according to the departmental dose-optimization program which includes automated exposure control, adjustment of the mA and/or kV according to patient size and/or use of iterative reconstruction technique. COMPARISON:  CT brain 04/26/2017 FINDINGS: Brain: No evidence of acute infarction, hemorrhage, hydrocephalus, extra-axial collection or mass lesion/mass effect. Vascular: No hyperdense vessel or unexpected calcification. Skull: Normal. Negative for fracture or focal lesion. Sinuses/Orbits: Retention cyst in the right maxillary sinus. Chronic appearing right nasal bone deformity Other: None IMPRESSION: Negative non contrasted CT appearance of the brain Electronically Signed   By: Jasmine PangKim  Fujinaga M.D.   On: 02/19/2021 23:09    EKG: I have personally reviewed EKG: NSR   Assessment/Plan Principal Problem:   Hypernatremia Active Problems:   AKI (acute kidney injury) (HCC)   Dehydration   Acute metabolic encephalopathy   Bipolar I disorder, current or most recent episode manic, severe (HCC)   Pneumomediastinum (HCC)   BMI 45.0-49.9, adult (HCC)    Hypernatremia Admit to  progressive bed. Inpatient. Estimated 15-20 liter free water deficit based on last recorded weight of 384 lbs in 12/2020. Continue with D5W @ 125 ml/hr.(ideally would like to run fluid at 3 ml/kg/hr). Would try and go faster on IVF rate but not sure pt's PIV can handle faster rate. May need midline/picc line for faster infusion rates. Attempt to target 1-2 meq/L/hr drop in serum sodium. Goal serum sodium 140-145.  Hyperglycemia may limit rate of change. Check BMP q4h. Adjust fluid rate as tolerated. Monitor CBG q2h. If CBG >400, may need to combine D5W and Sterile water into 1 line(i.e Y-connector D5W and sterile water into same PIV).  AKI (acute kidney injury) (HCC) Due to dehydration. Monitor BMP  Dehydration Due to poor po intake from his mental illness.  Acute metabolic encephalopathy Due to hypernatremia. Pt has pinpoint pupils. Have not rule out drug overdose. I&O cath for UA and UDS.  Bipolar I disorder, current or most recent episode manic, severe (HCC) Chronic. No list of meds from jail sent.  Pneumomediastinum (HCC) Chronic. Appears to have had pneumomediastinum on CT from 03-2017. No crepitus noted on physical exam.  BMI 45.0-49.9, adult (HCC) Chronic.  DVT prophylaxis: SQ Heparin Code Status: Full Code by default Family Communication: no family at bedside.  Disposition Plan: return to jail  Consults called: none Admission status: Inpatient,  progressive   Carollee Herter, DO [facility] 02/20/2021, 2:15 AM

## 2021-02-20 NOTE — Assessment & Plan Note (Signed)
Chronic. 

## 2021-02-20 NOTE — Assessment & Plan Note (Addendum)
Admit to progressive bed. Inpatient. Estimated 15-20 liter free water deficit based on last recorded weight of 384 lbs in 12/2020. Continue with D5W @ 125 ml/hr.(ideally would like to run fluid at 3 ml/kg/hr). Would try and go faster on IVF rate but not sure pt's PIV can handle faster rate. May need midline/picc line for faster infusion rates. Attempt to target 1-2 meq/L/hr drop in serum sodium. Goal serum sodium 140-145.  Hyperglycemia may limit rate of change. Check BMP q4h. Adjust fluid rate as tolerated. Monitor CBG q2h. If CBG >400, may need to combine D5W and Sterile water into 1 line(i.e Y-connector D5W and sterile water into same PIV).

## 2021-02-20 NOTE — Assessment & Plan Note (Signed)
Chronic. Appears to have had pneumomediastinum on CT from 03-2017. No crepitus noted on physical exam.

## 2021-02-20 NOTE — Assessment & Plan Note (Signed)
Due to dehydration. Monitor BMP

## 2021-02-21 DIAGNOSIS — F3113 Bipolar disorder, current episode manic without psychotic features, severe: Secondary | ICD-10-CM | POA: Diagnosis not present

## 2021-02-21 DIAGNOSIS — N179 Acute kidney failure, unspecified: Secondary | ICD-10-CM | POA: Diagnosis not present

## 2021-02-21 DIAGNOSIS — E870 Hyperosmolality and hypernatremia: Secondary | ICD-10-CM | POA: Diagnosis not present

## 2021-02-21 DIAGNOSIS — G9341 Metabolic encephalopathy: Secondary | ICD-10-CM | POA: Diagnosis not present

## 2021-02-21 DIAGNOSIS — J982 Interstitial emphysema: Secondary | ICD-10-CM

## 2021-02-21 LAB — CBC
HCT: 57.1 % — ABNORMAL HIGH (ref 39.0–52.0)
Hemoglobin: 17.4 g/dL — ABNORMAL HIGH (ref 13.0–17.0)
MCH: 26.5 pg (ref 26.0–34.0)
MCHC: 30.5 g/dL (ref 30.0–36.0)
MCV: 87 fL (ref 80.0–100.0)
Platelets: 114 10*3/uL — ABNORMAL LOW (ref 150–400)
RBC: 6.56 MIL/uL — ABNORMAL HIGH (ref 4.22–5.81)
RDW: 18.9 % — ABNORMAL HIGH (ref 11.5–15.5)
WBC: 11.3 10*3/uL — ABNORMAL HIGH (ref 4.0–10.5)
nRBC: 0.2 % (ref 0.0–0.2)

## 2021-02-21 LAB — BASIC METABOLIC PANEL
Anion gap: 15 (ref 5–15)
Anion gap: 14 (ref 5–15)
Anion gap: 15 (ref 5–15)
Anion gap: 13 (ref 5–15)
BUN: 29 mg/dL — ABNORMAL HIGH (ref 6–20)
BUN: 24 mg/dL — ABNORMAL HIGH (ref 6–20)
BUN: 21 mg/dL — ABNORMAL HIGH (ref 6–20)
BUN: 18 mg/dL (ref 6–20)
CO2: 25 mmol/L (ref 22–32)
CO2: 25 mmol/L (ref 22–32)
CO2: 23 mmol/L (ref 22–32)
CO2: 21 mmol/L — ABNORMAL LOW (ref 22–32)
Calcium: 10 mg/dL (ref 8.9–10.3)
Calcium: 10 mg/dL (ref 8.9–10.3)
Calcium: 9.8 mg/dL (ref 8.9–10.3)
Calcium: 9.2 mg/dL (ref 8.9–10.3)
Chloride: 116 mmol/L — ABNORMAL HIGH (ref 98–111)
Chloride: 113 mmol/L — ABNORMAL HIGH (ref 98–111)
Chloride: 112 mmol/L — ABNORMAL HIGH (ref 98–111)
Chloride: 114 mmol/L — ABNORMAL HIGH (ref 98–111)
Creatinine, Ser: 1.36 mg/dL — ABNORMAL HIGH (ref 0.61–1.24)
Creatinine, Ser: 1.27 mg/dL — ABNORMAL HIGH (ref 0.61–1.24)
Creatinine, Ser: 1.32 mg/dL — ABNORMAL HIGH (ref 0.61–1.24)
Creatinine, Ser: 1.37 mg/dL — ABNORMAL HIGH (ref 0.61–1.24)
GFR, Estimated: >60 mL/min (ref >60)
GFR, Estimated: >60 mL/min (ref >60)
GFR, Estimated: >60 mL/min (ref >60)
GFR, Estimated: >60 mL/min (ref >60)
Glucose, Bld: 136 mg/dL — ABNORMAL HIGH (ref 70–99)
Glucose, Bld: 112 mg/dL — ABNORMAL HIGH (ref 70–99)
Glucose, Bld: 124 mg/dL — ABNORMAL HIGH (ref 70–99)
Glucose, Bld: 121 mg/dL — ABNORMAL HIGH (ref 70–99)
Potassium: 5.2 mmol/L — ABNORMAL HIGH (ref 3.5–5.1)
Potassium: 3.7 mmol/L (ref 3.5–5.1)
Potassium: 3.7 mmol/L (ref 3.5–5.1)
Potassium: 4 mmol/L (ref 3.5–5.1)
Sodium: 156 mmol/L — ABNORMAL HIGH (ref 135–145)
Sodium: 152 mmol/L — ABNORMAL HIGH (ref 135–145)
Sodium: 150 mmol/L — ABNORMAL HIGH (ref 135–145)
Sodium: 148 mmol/L — ABNORMAL HIGH (ref 135–145)

## 2021-02-21 NOTE — ED Notes (Signed)
Attempted to place pt back on tele monitor, pt refusing and pulling wires back off

## 2021-02-21 NOTE — Consult Note (Addendum)
Brief Psychiatry Consult Note  The patient was last seen by the psychiatry service on 1/24. Interim documentation by primary team and nursing staff has been reviewed. Attempted to evaluate pt twice today and he essentially refused to interact both times; in the AM he would not wake up. In the afternoon he asks if I will be bringing him "milk" and essentially refuses to answer questions. He is noted to make threats to myself, security officers, and phlebotomist; per jail officers he is actually behaving better here. He denies SI and refuses to answer other questions. I do think it may eventually be necessary to use antipsychotics in this pt; unclear at this time which led to NMS (per documentation was multiple used in sequence). It does appear that he may have had multiple episodes of NMS as this is noted in notes prior to Oct 2022 although generally in context of agitation and polypharmacy. Agree with note from yesterday that other mood stabilizers such as carbamazepine may be useful. Patient declines to give consent to call his mother. Patient was not sent with a med sheet from jail.    - call jail tomorrow [phone] to speak to med tech,  confirm medications, and get permission to call mother (pt in custody of jail)  - discharge to jail (which has options for psychiatric care) as soon as medically appropriate.

## 2021-02-21 NOTE — Progress Notes (Signed)
Pt refusing to let RN put telemetry monitor back on. MD notified

## 2021-02-21 NOTE — Progress Notes (Signed)
PROGRESS NOTE  Brandon Fuentes  [id number] DOB: Jun 30, 1990 DOA: 02/19/2021 PCP: Patient, No Pcp Per (Inactive)   Brief Narrative: Brandon Fuentes is a 31 y.o. 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 He appeared dehydrated and altered, with AKI (SCr 1.7, BUN 38) and hypernatremia (Na 163). CT head was negative. CT abd/pelvis revealed moderate colonic stool burden without obstruction or other acute intraabdominal or pelvic pathology. There was mention of small lucency adjacent to thoracic aorta suspected to be due to trapped air from prior pneumomediastinum. IV fluid was started to correct free water deficit and the patient was admitted. Sodium level has improved slowly, though encephalopathy remains. He has become much more aggressive, given ativan, and psychiatry consulted.   Assessment & Plan: Principal Problem:   Hypernatremia Active Problems:   Bipolar I disorder, current or most recent episode manic, severe (HCC)   Pneumomediastinum (HCC)   Dehydration   BMI 45.0-49.9, adult (HCC)   Acute metabolic encephalopathy   AKI (acute kidney injury) ([place])  Acute metabolic encephalopathy: UDS, salicylate and EtOH were negative. No infectious nidus identified.  - Correct metabolic derangements as below - Appreciate psychiatry assistance. - Continue precautions to maintain patient and staff safety   Hypernatremia, AKI, dehydration: It's possible the patient's behavioral issues led to poor hydration or that dehydration worsened metabolic parameters leading to encephalopathy. Less suspicion for DI with undetectable lithium level. - Continue D5W @150cc /hr and serial monitoring.  Hyperkalemia: With stable renal function and no supplementation of potassium and no oral intake since prior checks  and the difficulty documented in obtaining blood specimens, hemolysis is suspected.  - Will recheck this prior to intervening. Monitor shows stable, normal QT interval.    Mania in bipolar 1 disorder:  - Avoid antipsychotics with NMS history.  - Lithium undetectable, ?if event precipitated by nonadherence - Ativan IV prn ordered - Appreciate medication recommendations by psychiatry.    Pneumomediastinum: Chronic, noted 2019, appears to be resolving. Not an active issue.   Morbid obesity: Previous BMI was 50.66.  - Weigh patient when able to do so safely.   Leukocytosis: Unclear etiology, though there is evidence of hemoconcentration. No pyuria or infection on CT's. - Without fever or meningismus or other localizing signs/symptoms, will monitor off abx for now.    Thrombocytopenia:  - Monitor. No bleeding currently  DVT prophylaxis: Heparin Code Status: Full Family Communication: None at bedside Disposition Plan:  Status is: Inpatient  Remains inpatient appropriate because: Persistent electrolyte derangements and encephalopathy. Guards at bedside, ultimate disposition will be back to jail  Consultants:  Psychiatry  Procedures:  None  Antimicrobials: None   Subjective: Pt more calm, states he's thirsty and hungry.   Objective: Vitals:   02/21/21 0500 02/21/21 0600 02/21/21 0900 02/21/21 0947  BP: 126/90 131/90 124/63 120/78  Pulse:  67 76 69  Resp:  18  20  Temp:    97.7 F (36.5 C)  TempSrc:    Oral  SpO2:  98% 100% 100%    Intake/Output Summary (Last 24 hours) at 02/21/2021 1023 Last data filed at 02/21/2021 [id number] Gross per 24 hour  Intake 4356 ml  Output --  Net 4356 ml   Gen: 31 y.o. male in no acute distress laying completely supine Neck: No meningismus Pulm: Non-labored breathing room air. Clear to auscultation bilaterally.  CV: Regular rate and rhythm. No murmur, rub, or gallop. No JVD, no pitting pedal edema. GI: Abdomen soft, non-tender,  non-distended, with normoactive bowel sounds. No organomegaly or masses felt. Ext: Warm, no deformities, in cuffs. Skin: No rashes, lesions or ulcers on visualized skin Neuro: Alert, oriented somewhat. Moves all extremities. Doesn't consistently follow commands.  Psych: Judgement and insight appear impaired.   Data Reviewed: I have personally reviewed following labs and imaging studies  CBC: Recent Labs  Lab 02/19/21 2229 02/21/21 0227  WBC 12.9* 11.3*  NEUTROABS 8.6*  --   HGB 16.5 17.4*  HCT 53.0* 57.1*  MCV 85.8 87.0  PLT 126* [id number]*   Basic Metabolic Panel: Recent Labs  Lab 02/19/21 2229 02/20/21 0614 02/20/21 1055 02/20/21 1400 02/21/21 0036  NA 163* 160* 159* 160* 156*  K 3.8 4.0 3.7 3.8 5.2*  CL 119* 123* 118* 121* 116*  CO2 25 29 28 31 25   GLUCOSE 127* 132* 102* 107* 136*  BUN 38* 35* 33* 31* 29*  CREATININE 1.70* 1.49* 1.52* 1.45* 1.36*  CALCIUM 10.2 9.4 10.2 9.8 10.0   GFR: CrCl cannot be calculated (Unknown ideal weight.). Liver Function Tests: Recent Labs  Lab 02/19/21 2229  AST 68*  ALT 67*  ALKPHOS 54  BILITOT 1.7*  PROT 8.1  ALBUMIN 4.4   No results for input(s): LIPASE, AMYLASE in the last 168 hours. No results for input(s): AMMONIA in the last 168 hours. Coagulation Profile: No results for input(s): INR, PROTIME in the last 168 hours. Cardiac Enzymes: Recent Labs  Lab 02/20/21 1826  CKTOTAL 1,083*   BNP (last 3 results) No results for input(s): PROBNP in the last 8760 hours. HbA1C: Recent Labs    02/19/21 2229  HGBA1C 5.9*   CBG: Recent Labs  Lab 02/20/21 1030 02/20/21 1240  GLUCAP 105* 102*   Lipid Profile: No results for input(s): CHOL, HDL, LDLCALC, TRIG, CHOLHDL, LDLDIRECT in the last 72 hours. Thyroid Function Tests: No results for input(s): TSH, T4TOTAL, FREET4, T3FREE, THYROIDAB in the last 72 hours. Anemia Panel: No results for input(s): VITAMINB12, FOLATE, FERRITIN, TIBC, IRON, RETICCTPCT in the last 72  hours. Urine analysis:    Component Value Date/Time   COLORURINE AMBER (A) 02/20/2021 0600   APPEARANCEUR CLOUDY (A) 02/20/2021 0600   LABSPEC 1.029 02/20/2021 0600   PHURINE 5.0 02/20/2021 0600   GLUCOSEU NEGATIVE 02/20/2021 0600   HGBUR SMALL (A) 02/20/2021 0600   BILIRUBINUR NEGATIVE 02/20/2021 0600   KETONESUR 5 (A) 02/20/2021 0600   PROTEINUR 30 (A) 02/20/2021 0600   NITRITE NEGATIVE 02/20/2021 0600   LEUKOCYTESUR NEGATIVE 02/20/2021 0600   No results found for this or any previous visit (from the past 240 hour(s)).    Radiology Studies: CT ABDOMEN PELVIS WO CONTRAST  Result Date: 02/20/2021 CLINICAL DATA:  Abdominal pain. EXAM: CT ABDOMEN AND PELVIS WITHOUT CONTRAST TECHNIQUE: Multidetector CT imaging of the abdomen and pelvis was performed following the standard protocol without IV contrast. RADIATION DOSE REDUCTION: This exam was performed according to the departmental dose-optimization program which includes automated exposure control, adjustment of the mA and/or kV according to patient size and/or use of iterative reconstruction technique. COMPARISON:  CT dated 04/26/2017. FINDINGS: Evaluation of this exam is limited in the absence of intravenous contrast. Lower chest: Minimal bibasilar atelectasis. Small crescentic lucency adjacent to the descending thoracic aorta may represent small amount of pneumomediastinum or trapped air from prior pneumomediastinum. No intra-abdominal free air or free fluid. Hepatobiliary: No focal liver abnormality is seen. No  gallstones, gallbladder wall thickening, or biliary dilatation. Pancreas: Unremarkable. No pancreatic ductal dilatation or surrounding inflammatory changes. Spleen: Normal in size without focal abnormality. Adrenals/Urinary Tract: The adrenal glands are unremarkable. The kidneys, visualized ureters, and urinary bladder appear unremarkable. Stomach/Bowel: Moderate stool throughout the colon. No bowel obstruction or active inflammation. The  appendix is normal. Vascular/Lymphatic: The abdominal aorta and IVC unremarkable on this noncontrast CT. No portal venous gas. There is no adenopathy. Reproductive: The prostate and seminal vesicles are grossly unremarkable. No pelvic mass. Other: None Musculoskeletal: No acute or significant osseous findings. IMPRESSION: 1. No acute intra-abdominal or pelvic pathology. 2. Moderate colonic stool burden. No bowel obstruction. Normal appendix. 3. Small crescentic lucency adjacent to the descending thoracic aorta may represent small amount of pneumomediastinum or trapped air from prior pneumomediastinum. Electronically Signed   By: Anner Crete M.D.   On: 02/20/2021 00:45   CT Head Wo Contrast  Result Date: 02/19/2021 CLINICAL DATA:  Mental status change EXAM: CT HEAD WITHOUT CONTRAST TECHNIQUE: Contiguous axial images were obtained from the base of the skull through the vertex without intravenous contrast. RADIATION DOSE REDUCTION: This exam was performed according to the departmental dose-optimization program which includes automated exposure control, adjustment of the mA and/or kV according to patient size and/or use of iterative reconstruction technique. COMPARISON:  CT brain 04/26/2017 FINDINGS: Brain: No evidence of acute infarction, hemorrhage, hydrocephalus, extra-axial collection or mass lesion/mass effect. Vascular: No hyperdense vessel or unexpected calcification. Skull: Normal. Negative for fracture or focal lesion. Sinuses/Orbits: Retention cyst in the right maxillary sinus. Chronic appearing right nasal bone deformity Other: None IMPRESSION: Negative non contrasted CT appearance of the brain Electronically Signed   By: Donavan Foil M.D.   On: 02/19/2021 23:09    Scheduled Meds:  heparin  5,000 Units Subcutaneous Q8H   Continuous Infusions:  dextrose 150 mL/hr at 02/21/21 0653     LOS: 1 day     Patrecia Pour, MD [facility] [email or web address] 02/21/2021, 10:23 AM

## 2021-02-21 NOTE — ED Notes (Signed)
Pt taking himself off of tele monitor and becoming agitated. PRN ativan given

## 2021-02-21 NOTE — Progress Notes (Signed)
Pt arrived to [facility] 5w22, with 2 officers at bedside, skin noted to be intact with no skin breakdown, pt oriented to unit all questions answered

## 2021-02-21 NOTE — ED Notes (Signed)
Pt given ice chips

## 2021-02-21 NOTE — Progress Notes (Signed)
Nurse attempted to feed pt, pt refusing at this time to eat food provided to him, becoming verbally aggressive with officers who are sitting with pt.

## 2021-02-22 DIAGNOSIS — F309 Manic episode, unspecified: Secondary | ICD-10-CM

## 2021-02-22 LAB — CBC WITH DIFFERENTIAL/PLATELET
Abs Immature Granulocytes: 0.04 10*3/uL (ref 0.00–0.07)
Basophils Absolute: 0.1 10*3/uL (ref 0.0–0.1)
Basophils Relative: 0 %
Eosinophils Absolute: 0.4 10*3/uL (ref 0.0–0.5)
Eosinophils Relative: 3 %
HCT: 54 % — ABNORMAL HIGH (ref 39.0–52.0)
Hemoglobin: 16.7 g/dL (ref 13.0–17.0)
Immature Granulocytes: 0 %
Lymphocytes Relative: 34 %
Lymphs Abs: 3.9 10*3/uL (ref 0.7–4.0)
MCH: 26.4 pg (ref 26.0–34.0)
MCHC: 30.9 g/dL (ref 30.0–36.0)
MCV: 85.4 fL (ref 80.0–100.0)
Monocytes Absolute: 1.3 10*3/uL — ABNORMAL HIGH (ref 0.1–1.0)
Monocytes Relative: 11 %
Neutro Abs: 5.9 10*3/uL (ref 1.7–7.7)
Neutrophils Relative %: 52 %
Platelets: 95 10*3/uL — ABNORMAL LOW (ref 150–400)
RBC: 6.32 MIL/uL — ABNORMAL HIGH (ref 4.22–5.81)
RDW: 17.9 % — ABNORMAL HIGH (ref 11.5–15.5)
WBC: 11.6 10*3/uL — ABNORMAL HIGH (ref 4.0–10.5)
nRBC: 0.3 % — ABNORMAL HIGH (ref 0.0–0.2)

## 2021-02-22 LAB — COMPREHENSIVE METABOLIC PANEL
ALT: 75 U/L — ABNORMAL HIGH (ref 0–44)
AST: 85 U/L — ABNORMAL HIGH (ref 15–41)
Albumin: 3.9 g/dL (ref 3.5–5.0)
Alkaline Phosphatase: 55 U/L (ref 38–126)
Anion gap: 17 — ABNORMAL HIGH (ref 5–15)
BUN: 19 mg/dL (ref 6–20)
CO2: 19 mmol/L — ABNORMAL LOW (ref 22–32)
Calcium: 9.5 mg/dL (ref 8.9–10.3)
Chloride: 112 mmol/L — ABNORMAL HIGH (ref 98–111)
Creatinine, Ser: 1.39 mg/dL — ABNORMAL HIGH (ref 0.61–1.24)
GFR, Estimated: >60 mL/min (ref >60)
Glucose, Bld: 74 mg/dL (ref 70–99)
Potassium: 5 mmol/L (ref 3.5–5.1)
Sodium: 148 mmol/L — ABNORMAL HIGH (ref 135–145)
Total Bilirubin: 2.4 mg/dL — ABNORMAL HIGH (ref 0.3–1.2)
Total Protein: 7.5 g/dL (ref 6.5–8.1)

## 2021-02-22 NOTE — Progress Notes (Signed)
PROGRESS NOTE  Larren Playford  [id number] DOB: 05-23-1990 DOA: 02/19/2021 PCP: Patient, No Pcp Per (Inactive)   Brief Narrative:  Tyion Ghobrial is a 31 y.o. 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 He appeared dehydrated and altered, with AKI (SCr 1.7, BUN 38) and hypernatremia (Na 163). CT head was negative. CT abd/pelvis revealed moderate colonic stool burden without obstruction or other acute intraabdominal or pelvic pathology. There was mention of small lucency adjacent to thoracic aorta suspected to be due to trapped air from prior pneumomediastinum. IV fluid was started to correct free water deficit and the patient was admitted. Sodium level has improved slowly, though encephalopathy remains. He has become much more aggressive, given ativan, and psychiatry consulted.   Assessment & Plan: Principal Problem:   Hypernatremia Active Problems:   Bipolar I disorder, current or most recent episode manic, severe (HCC)   Pneumomediastinum (HCC)   Dehydration   BMI 45.0-49.9, adult (HCC)   Acute metabolic encephalopathy   AKI (acute kidney injury) ([place])  Acute metabolic encephalopathy: UDS, salicylate and EtOH were negative. No infectious nidus identified.  - Correct metabolic derangements as below - Appreciate psychiatry assistance. - Continue precautions to maintain patient and staff safety   Hypernatremia, AKI, dehydration: It's possible the patient's behavioral issues led to poor hydration or that dehydration worsened metabolic parameters leading to encephalopathy. Less suspicion for DI with undetectable lithium level. -Hyponatremia improving, continue with D5W 100 cc, monitor labs closely, oral intake remains unreliable -Sinew to monitor closely, encourage fluid intake as  creatinine appears to be trending up.  Hyperkalemia: With stable renal function and no supplementation of potassium and no oral intake since prior checks and the difficulty documented in obtaining blood specimens, hemolysis is suspected.  - Will recheck this prior to intervening. Monitor shows stable, normal QT interval.    Mania in bipolar 1 disorder:  - Lithium undetectable, ?if event precipitated by nonadherence - Ativan IV prn ordered -Patient remains with significant mood/anger outbursts, but his oral intake is more reliable today, as needed Ativan has been used with some success, may need to rechallenge again with lower dose antipsychotics, will defer management to psychiatry.   Pneumomediastinum: Chronic, noted 2019, appears to be resolving. Not an active issue.   Morbid obesity: Previous BMI was 50.66.  - Weigh patient when able to do so safely.   Leukocytosis: Unclear etiology, though there is evidence of hemoconcentration. No pyuria or infection on CT's. - Without fever or meningismus or other localizing signs/symptoms, will monitor off abx for now.    Thrombocytopenia:  - Monitor. No bleeding currently  DVT prophylaxis: Heparin Code Status: Full Family Communication: None at bedside Disposition Plan:  Status is: Inpatient  Remains inpatient appropriate because: Persistent electrolyte derangements and encephalopathy. Guards at bedside, ultimate disposition will be back to jail  Consultants:  Psychiatry  Procedures:  None  Antimicrobials: None   Subjective: Pt more calm, states he's thirsty and hungry.   Objective: Vitals:   02/21/21 2327 02/22/21 0400 02/22/21 0700 02/22/21 1218  BP: 102/74 109/63 112/68 102/72  Pulse: 85 67 64 88  Resp: 17 14 16 17   Temp: 98 F (36.7 C)   98.3 F (36.8 C)  TempSrc: Axillary   Axillary  SpO2: 92% 93% 96% 95%    Intake/Output Summary (Last 24 hours) at  02/22/2021 1313 Last data filed at 02/22/2021 0700 Gross per 24 hour   Intake 644 ml  Output --  Net 644 ml       Awake, communicative, sometimes uses foul language, does not want to follow any commands, but he is awake and alert. fair air entry bilaterally, bowel sounds present, abdomen soft, no cyanosis or edema   Data Reviewed: I have personally reviewed following labs and imaging studies  CBC: Recent Labs  Lab 02/19/21 2229 02/21/21 0227 02/22/21 0042  WBC 12.9* 11.3* 11.6*  NEUTROABS 8.6*  --  5.9  HGB 16.5 17.4* 16.7  HCT 53.0* 57.1* 54.0*  MCV 85.8 87.0 85.4  PLT 126* 114* 95*   Basic Metabolic Panel: Recent Labs  Lab 02/21/21 0036 02/21/21 1047 02/21/21 1625 02/21/21 2220 02/22/21 0042  NA 156* 152* 150* 148* 148*  K 5.2* 3.7 3.7 4.0 5.0  CL 116* 113* 112* 114* 112*  CO2 25 25 23  21* 19*  GLUCOSE 136* 112* 124* 121* 74  BUN 29* 24* 21* 18 19  CREATININE 1.36* 1.27* 1.32* 1.37* 1.39*  CALCIUM 10.0 10.0 9.8 9.2 9.5   GFR: CrCl cannot be calculated (Unknown ideal weight.). Liver Function Tests: Recent Labs  Lab 02/19/21 2229 02/22/21 0042  AST 68* 85*  ALT 67* 75*  ALKPHOS 54 55  BILITOT 1.7* 2.4*  PROT 8.1 7.5  ALBUMIN 4.4 3.9   No results for input(s): LIPASE, AMYLASE in the last 168 hours. No results for input(s): AMMONIA in the last 168 hours. Coagulation Profile: No results for input(s): INR, PROTIME in the last 168 hours. Cardiac Enzymes: Recent Labs  Lab 02/20/21 1826  CKTOTAL 1,083*   BNP (last 3 results) No results for input(s): PROBNP in the last 8760 hours. HbA1C: Recent Labs    02/19/21 2229  HGBA1C 5.9*   CBG: Recent Labs  Lab 02/20/21 1030 02/20/21 1240  GLUCAP 105* 102*   Lipid Profile: No results for input(s): CHOL, HDL, LDLCALC, TRIG, CHOLHDL, LDLDIRECT in the last 72 hours. Thyroid Function Tests: No results for input(s): TSH, T4TOTAL, FREET4, T3FREE, THYROIDAB in the last 72 hours. Anemia Panel: No results for input(s): VITAMINB12, FOLATE, FERRITIN, TIBC, IRON, RETICCTPCT  in the last 72 hours. Urine analysis:    Component Value Date/Time   COLORURINE AMBER (A) 02/20/2021 0600   APPEARANCEUR CLOUDY (A) 02/20/2021 0600   LABSPEC 1.029 02/20/2021 0600   PHURINE 5.0 02/20/2021 0600   GLUCOSEU NEGATIVE 02/20/2021 0600   HGBUR SMALL (A) 02/20/2021 0600   BILIRUBINUR NEGATIVE 02/20/2021 0600   KETONESUR 5 (A) 02/20/2021 0600   PROTEINUR 30 (A) 02/20/2021 0600   NITRITE NEGATIVE 02/20/2021 0600   LEUKOCYTESUR NEGATIVE 02/20/2021 0600   No results found for this or any previous visit (from the past 240 hour(s)).    Radiology Studies: No results found.  Scheduled Meds:  heparin  5,000 Units Subcutaneous Q8H   Continuous Infusions:  dextrose 100 mL/hr at 02/22/21 0646     LOS: 2 days     Phillips Climes, MD [facility] [email or web address] 02/22/2021, 1:13 PM Patient ID: Daquain Contorno, male   DOB: 22-Mar-1990, 31 y.o.   MRN: [id number]

## 2021-02-22 NOTE — Care Management (Signed)
°  Transition of Care [facility]) Screening Note   Patient Details  Name: Kadeen Sroka Date of Birth: 25-Nov-1990   Transition of Care [facility]) CM/SW Contact:    Lawerance Sabal, RN Phone Number: 02/22/2021, 9:06 AM    Transition of Care Department [facility]) has reviewed patient we will continue to monitor patient advancement through interdisciplinary progression rounds.   Patient in custody. TOC will need to confirm with jail patient safe to return. [phone] when discharged

## 2021-02-22 NOTE — Consult Note (Signed)
[facility] Face-to-Face Psychiatry follow up Consult   Reason for Consult: Mania, history of NMS Referring Physician:  Dr Vance Gather.  Patient Identification: Brandon Fuentes MRN:  [id number] Principal Diagnosis: Hypernatremia Diagnosis:  Principal Problem:   Hypernatremia Active Problems:   Bipolar I disorder, current or most recent episode manic, severe (HCC)   Pneumomediastinum (HCC)   Dehydration   BMI 45.0-49.9, adult (HCC)   Acute metabolic encephalopathy   AKI (acute kidney injury) ([place])   Total Time spent with patient: 20 minutes  HPI:   Brandon Fuentes is a 31 y.o. male patient with past psychiatric history of bipolar rhabdomyolysis, and NMS (2/2 antipsychotics) presented to ED from [place] jail for not communicating well, not eating and drinking well.  Patient found to have hypernatremia with sodium 163 and AKI with creatinine 1.7.  Psych consult placed for mania with history of NMS.   Information obtained by chart review- Patient had been on multiple medications including antipsychotics and mood stabilizers in the past (Haldol, lithium, Depakote, olanzapine, Geodon, risperidone, Clozaril,, clonidine, Ativan, propanolol) .  Patient has multiple presentation to ED for episodes of agitation and psychosis.  He was last presented to the ED for agitation and psychosis on 01/05/2021.  His last psych hospitalization was on 02/05/2019 at [facility] for mania.  He was discharged on Clozaril, Zyprexa Zydis, lithium, clonidine, Ativan, propanolol. Per EMR, Pt was taken off all psych meds due to NMS in Oct 2022. Pt's current outpatient meds includes Hydroxyzine 50 mg TID as needed for agitation.  Patient also has allergies due to H/o NMS  to-Haldol, olanzapine, Depakote, Geodon, risperidone, Zoloft, atomoxetine.  Labs reviewed at admission: Sodium 160, creatinine 1.49, UDS negative, ethanol less than 10, Salicylate < 7, 123456, WBCs 12.9, CT head negative. EKG QTc 542 (past 12/8-  512) CK level high at 1083.  Recommendations -Recommend medical management of Encephalopathy including electrolyte derangements -Avoid Antipsychotics due to h/o NMS and prolonged QTc. (EKG QTc 542 ).  -We will consider adding mood stabilizer possibly Trileptal or Tegretol when patient is medically stable. Pt has allergies due to H/o NMS  to-Haldol, olanzapine, Depakote, Geodon, risperidone, Zoloft, atomoxetine. - Will try calling Jail again tomorrow to get information about his current medications and to get permission to call mother. Tried calling the number provided but no answer.  Disposition discharge to jail (which has options for psychiatric care) as soon as medically appropriate.   Thank you for this psych consult.  Psychiatry will continue to follow.  Subjective: Patient seen this morning .  Patient is responding to internal stimuli and talking to himself.  He looks calmer as compared to yesterday and answered some questions.  His speech is slurred.  He is alert and oriented x3.  Knows president and past presidents.  He denies any depression or anxiety.  He states his mood is "good".  He denies SI, HI, AVH.  He states he slept well last night and reports good appetite.  Denies any other concerns. Talked to nursing staff who stated that patient was cursing and threatening staff overnight and this morning.   [place] @ [phone] to confirm his meds- No answer. Left message.  Past Psychiatric History: Bipolar, and NMS, rhabdomyolysis  See Chart review in H&P for details.  Risk to Self:  No Risk to Others:  Yes Prior Inpatient Therapy:  Yes Prior Outpatient Therapy:    Past Medical History:  Past Medical History:  Diagnosis Date   Bipolar 1 disorder ([place])  No past surgical history on file. Family History:  Family History  Problem Relation Age of Onset   Diabetes Mother    Family Psychiatric  History:  Social History:  Social History   Substance and Sexual Activity   Alcohol Use No     Social History   Substance and Sexual Activity  Drug Use No    Social History   Socioeconomic History   Marital status: Single    Spouse name: Not on file   Number of children: Not on file   Years of education: Not on file   Highest education level: Not on file  Occupational History   Not on file  Tobacco Use   Smoking status: Never   Smokeless tobacco: Never  Vaping Use   Vaping Use: Never used  Substance and Sexual Activity   Alcohol use: No   Drug use: No   Sexual activity: Yes    Birth control/protection: None  Other Topics Concern   Not on file  Social History Narrative   Not on file   Social Determinants of Health   Financial Resource Strain: Not on file  Food Insecurity: Not on file  Transportation Needs: Not on file  Physical Activity: Not on file  Stress: Not on file  Social Connections: Not on file   Additional Social History:    Allergies:   Allergies  Allergen Reactions   Atomoxetine Other (See Comments)    Increased agitation, due to Neuroleptic malignant syndrome (NMS)    Haloperidol Lactate Other (See Comments)    Per mother--caused NMS neuroleptic malignant syndrome   Olanzapine Other (See Comments)    Neuroleptic malignant syndrome (NMS) after use   Valproic Acid Other (See Comments)    Cannot take, due to Neuroleptic malignant syndrome (NMS)     Chlorpromazine Other (See Comments)    Drug-induced liver injury   Depakote [Divalproex Sodium] Other (See Comments)    Sleepiness, anger (Patient has Neuroleptic malignant syndrome- NMS)    Geodon [Ziprasidone Hcl] Other (See Comments)    Per mom, patient gets very agitated   Haloperidol Other (See Comments)    Per mother--caused NMS neuroleptic malignant syndrome   Ketorolac Other (See Comments)    Cannot take, due to Neuroleptic malignant syndrome (NMS)   Risperidone     Per mother caused Neuroleptic malignant syndrome   Risperidone And Related Other (See Comments)     Per mother caused Neuroleptic malignant syndrome   Zoloft [Sertraline Hcl] Other (See Comments)    Anger (patient has Neuroleptic malignant syndrome (NMS))    Labs:  Results for orders placed or performed during the hospital encounter of 02/19/21 (from the past 48 hour(s))  CK     Status: Abnormal   Collection Time: 02/20/21  6:26 PM  Result Value Ref Range   Total CK 1,083 (H) 49 - 397 U/L    Comment: Performed at [facility], [address] [place], [place] [id number]  Basic metabolic panel     Status: Abnormal   Collection Time: 02/21/21 12:36 AM  Result Value Ref Range   Sodium 156 (H) 135 - 145 mmol/L   Potassium 5.2 (H) 3.5 - 5.1 mmol/L    Comment: DELTA CHECK NOTED   Chloride 116 (H) 98 - 111 mmol/L   CO2 25 22 - 32 mmol/L   Glucose, Bld 136 (H) 70 - 99 mg/dL    Comment: Glucose reference range applies only to samples taken after fasting for at least 8  hours.   BUN 29 (H) 6 - 20 mg/dL   Creatinine, Ser 1.36 (H) 0.61 - 1.24 mg/dL   Calcium 10.0 8.9 - 10.3 mg/dL   GFR, Estimated >60 >60 mL/min    Comment: (NOTE) Calculated using the CKD-EPI Creatinine Equation (2021)    Anion gap 15 5 - 15    Comment: Performed at [facility] [address] [place], [place] [address]  CBC     Status: Abnormal   Collection Time: 02/21/21  2:27 AM  Result Value Ref Range   WBC 11.3 (H) 4.0 - 10.5 K/uL   RBC 6.56 (H) 4.22 - 5.81 MIL/uL   Hemoglobin 17.4 (H) 13.0 - 17.0 g/dL   HCT 57.1 (H) 39.0 - 52.0 %   MCV 87.0 80.0 - 100.0 fL   MCH 26.5 26.0 - 34.0 pg   MCHC 30.5 30.0 - 36.0 g/dL   RDW 18.9 (H) 11.5 - 15.5 %   Platelets 114 (L) 150 - 400 K/uL    Comment: Immature Platelet Fraction may be clinically indicated, consider ordering this additional test [id number] REPEATED TO VERIFY PLATELET COUNT CONFIRMED BY SMEAR    nRBC 0.2 0.0 - 0.2 %    Comment: Performed at [facility], [place] [address], [place], [place] [id number]  Basic metabolic panel     Status:  Abnormal   Collection Time: 02/21/21 10:47 AM  Result Value Ref Range   Sodium 152 (H) 135 - 145 mmol/L   Potassium 3.7 3.5 - 5.1 mmol/L   Chloride 113 (H) 98 - 111 mmol/L   CO2 25 22 - 32 mmol/L   Glucose, Bld 112 (H) 70 - 99 mg/dL    Comment: Glucose reference range applies only to samples taken after fasting for at least 8 hours.   BUN 24 (H) 6 - 20 mg/dL   Creatinine, Ser 1.27 (H) 0.61 - 1.24 mg/dL   Calcium 10.0 8.9 - 10.3 mg/dL   GFR, Estimated >60 >60 mL/min    Comment: (NOTE) Calculated using the CKD-EPI Creatinine Equation (2021)    Anion gap 14 5 - 15    Comment: Performed at [facility] [address] [place], [place] [id number]  Basic metabolic panel     Status: Abnormal   Collection Time: 02/21/21  4:25 PM  Result Value Ref Range   Sodium 150 (H) 135 - 145 mmol/L   Potassium 3.7 3.5 - 5.1 mmol/L   Chloride 112 (H) 98 - 111 mmol/L   CO2 23 22 - 32 mmol/L   Glucose, Bld 124 (H) 70 - 99 mg/dL    Comment: Glucose reference range applies only to samples taken after fasting for at least 8 hours.   BUN 21 (H) 6 - 20 mg/dL   Creatinine, Ser 1.32 (H) 0.61 - 1.24 mg/dL   Calcium 9.8 8.9 - 10.3 mg/dL   GFR, Estimated >60 >60 mL/min    Comment: (NOTE) Calculated using the CKD-EPI Creatinine Equation (2021)    Anion gap 15 5 - 15    Comment: Performed at [facility] [address], [place], [place] [id number]  Basic metabolic panel     Status: Abnormal   Collection Time: 02/21/21 10:20 PM  Result Value Ref Range   Sodium 148 (H) 135 - 145 mmol/L   Potassium 4.0 3.5 - 5.1 mmol/L   Chloride 114 (H) 98 - 111 mmol/L   CO2 21 (L) 22 - 32 mmol/L   Glucose, Bld  121 (H) 70 - 99 mg/dL    Comment: Glucose reference range applies only to samples taken after fasting for at least 8 hours.   BUN 18 6 - 20 mg/dL   Creatinine, Ser 1.37 (H) 0.61 - 1.24 mg/dL   Calcium 9.2 8.9 - 10.3 mg/dL   GFR, Estimated >60 >60 mL/min    Comment: (NOTE) Calculated using the CKD-EPI  Creatinine Equation (2021)    Anion gap 13 5 - 15    Comment: Performed at [facility] [address] [place], [place] [address]  Comprehensive metabolic panel     Status: Abnormal   Collection Time: 02/22/21 12:42 AM  Result Value Ref Range   Sodium 148 (H) 135 - 145 mmol/L   Potassium 5.0 3.5 - 5.1 mmol/L    Comment: DELTA CHECK NOTED   Chloride 112 (H) 98 - 111 mmol/L   CO2 19 (L) 22 - 32 mmol/L   Glucose, Bld 74 70 - 99 mg/dL    Comment: Glucose reference range applies only to samples taken after fasting for at least 8 hours.   BUN 19 6 - 20 mg/dL   Creatinine, Ser 1.39 (H) 0.61 - 1.24 mg/dL   Calcium 9.5 8.9 - 10.3 mg/dL   Total Protein 7.5 6.5 - 8.1 g/dL   Albumin 3.9 3.5 - 5.0 g/dL   AST 85 (H) 15 - 41 U/L   ALT 75 (H) 0 - 44 U/L   Alkaline Phosphatase 55 38 - 126 U/L   Total Bilirubin 2.4 (H) 0.3 - 1.2 mg/dL   GFR, Estimated >60 >60 mL/min    Comment: (NOTE) Calculated using the CKD-EPI Creatinine Equation (2021)    Anion gap 17 (H) 5 - 15    Comment: Performed at [facility], [place] [address] [place], [place] [address]  CBC with Differential/Platelet     Status: Abnormal   Collection Time: 02/22/21 12:42 AM  Result Value Ref Range   WBC 11.6 (H) 4.0 - 10.5 K/uL   RBC 6.32 (H) 4.22 - 5.81 MIL/uL   Hemoglobin 16.7 13.0 - 17.0 g/dL   HCT 54.0 (H) 39.0 - 52.0 %   MCV 85.4 80.0 - 100.0 fL   MCH 26.4 26.0 - 34.0 pg   MCHC 30.9 30.0 - 36.0 g/dL   RDW 17.9 (H) 11.5 - 15.5 %   Platelets 95 (L) 150 - 400 K/uL    Comment: Immature Platelet Fraction may be clinically indicated, consider ordering this additional test [id number] CONSISTENT WITH PREVIOUS RESULT REPEATED TO VERIFY    nRBC 0.3 (H) 0.0 - 0.2 %   Neutrophils Relative % 52 %   Neutro Abs 5.9 1.7 - 7.7 K/uL   Lymphocytes Relative 34 %   Lymphs Abs 3.9 0.7 - 4.0 K/uL   Monocytes Relative 11 %   Monocytes Absolute 1.3 (H) 0.1 - 1.0 K/uL   Eosinophils Relative 3 %   Eosinophils Absolute 0.4 0.0 -  0.5 K/uL   Basophils Relative 0 %   Basophils Absolute 0.1 0.0 - 0.1 K/uL   Immature Granulocytes 0 %   Abs Immature Granulocytes 0.04 0.00 - 0.07 K/uL    Comment: Performed at [facility], [place] [address] [place], [place] [address]    Current Facility-Administered Medications  Medication Dose Route Frequency Provider Last Rate Last Admin   acetaminophen (TYLENOL) suppository 650 mg  650 mg Rectal Q4H PRN Kristopher Oppenheim, DO       dextrose 5 %  solution   Intravenous Continuous Elgergawy, Silver Huguenin, MD 100 mL/hr at 02/22/21 0646 Rate Change at 02/22/21 0646   heparin injection 5,000 Units  5,000 Units Subcutaneous Q8H Kristopher Oppenheim, DO   5,000 Units at 02/22/21 0646   LORazepam (ATIVAN) injection 2 mg  2 mg Intravenous Q4H PRN Patrecia Pour, MD   2 mg at 02/22/21 0948   ondansetron (ZOFRAN) injection 4 mg  4 mg Intravenous Q6H PRN Kristopher Oppenheim, DO        Musculoskeletal: Strength & Muscle Tone:  Moving all 4 limbs. Uncooperative Gait & Station:  deferred Patient leans: Backward            Psychiatric Specialty Exam:  Presentation  General Appearance: Disheveled (both hands In handcuffs and both legs in chains)  Eye Contact:Minimal  Speech:Slurred  Speech Volume:Normal  Handedness:Right   Mood and Affect  Mood:Euthymic ("good")  Affect:Restricted; Blunt; Non-Congruent   Thought Process  Thought Processes:Disorganized (responding to internal stimuli)  Descriptions of Associations:Tangential  Orientation:-- (Not able to test. Pt uncooperative)  Thought Content:Tangential (Denies SI, HI, AVH but responding to IS)  History of Schizophrenia/Schizoaffective disorder:No data recorded Duration of Psychotic Symptoms:Less than six months  Hallucinations:Hallucinations: None  Ideas of Reference:None  Suicidal Thoughts:Suicidal Thoughts: No  Homicidal Thoughts:Homicidal Thoughts: No   Sensorium  Memory:Immediate Poor; Remote Poor; Recent  Poor  Judgment:Poor  Insight:Lacking   Executive Functions  Concentration:Poor  Attention Span:Poor  Recall:Poor  Fund of Knowledge:Poor  Language:Poor   Psychomotor Activity  Psychomotor Activity:Psychomotor Activity: Increased  Assets  Assets:Financial Resources/Insurance; Housing; Physical Health; Resilience; Social Support   Sleep  Sleep:Sleep: Good  Physical Exam: Physical Exam Vitals reviewed.  Pulmonary:     Effort: Pulmonary effort is normal.  Neurological:     Comments: uncooperative   Review of Systems  Psychiatric/Behavioral:  Negative for depression, hallucinations and suicidal ideas. The patient is not nervous/anxious and does not have insomnia.   Blood pressure 102/72, pulse 88, temperature 98.3 F (36.8 C), temperature source Axillary, resp. rate 17, SpO2 95 %. There is no height or weight on file to calculate BMI.  Armando Reichert, MD 02/22/2021 2:08 PM

## 2021-02-23 LAB — BASIC METABOLIC PANEL
Anion gap: 9 (ref 5–15)
BUN: 12 mg/dL (ref 6–20)
CO2: 27 mmol/L (ref 22–32)
Calcium: 9.1 mg/dL (ref 8.9–10.3)
Chloride: 109 mmol/L (ref 98–111)
Creatinine, Ser: 1.14 mg/dL (ref 0.61–1.24)
GFR, Estimated: >60 mL/min (ref >60)
Glucose, Bld: 136 mg/dL — ABNORMAL HIGH (ref 70–99)
Potassium: 3 mmol/L — ABNORMAL LOW (ref 3.5–5.1)
Sodium: 145 mmol/L (ref 135–145)

## 2021-02-23 MED ORDER — SENNOSIDES-DOCUSATE SODIUM 8.6-50 MG PO TABS
2.0000 | ORAL_TABLET | Freq: Two times a day (BID) | ORAL | Status: DC
  Filled 2021-02-23 (×2): qty 2

## 2021-02-23 MED ORDER — MAGNESIUM SULFATE IN D5W 1-5 GM/100ML-% IV SOLN
1.0000 g | Freq: Once | INTRAVENOUS | Status: AC
  Administered 2021-02-23: 1 g via INTRAVENOUS
  Filled 2021-02-23: qty 100

## 2021-02-23 MED ORDER — SODIUM CHLORIDE 0.9 % IV BOLUS
500.0000 mL | Freq: Once | INTRAVENOUS | Status: AC
  Administered 2021-02-23: 500 mL via INTRAVENOUS

## 2021-02-23 MED ORDER — POTASSIUM CHLORIDE CRYS ER 20 MEQ PO TBCR
40.0000 meq | EXTENDED_RELEASE_TABLET | Freq: Four times a day (QID) | ORAL | Status: AC
  Administered 2021-02-23: 40 meq via ORAL
  Filled 2021-02-23 (×2): qty 2

## 2021-02-23 MED ORDER — OXCARBAZEPINE 150 MG PO TABS
150.0000 mg | ORAL_TABLET | Freq: Two times a day (BID) | ORAL | Status: DC
  Filled 2021-02-23 (×3): qty 1

## 2021-02-23 MED ORDER — DOCUSATE SODIUM 100 MG PO CAPS: 100.0000 mg | ORAL_CAPSULE | Freq: Two times a day (BID) | ORAL | Status: DC | PRN

## 2021-02-23 NOTE — Progress Notes (Signed)
PROGRESS NOTE  Brandon Fuentes  [id number] DOB: 1990/04/26 DOA: 02/19/2021 PCP: Patient, No Pcp Per (Inactive)   Brief Narrative:  Brandon Fuentes is a 31 y.o. 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 He appeared dehydrated and altered, with AKI (SCr 1.7, BUN 38) and hypernatremia (Na 163). CT head was negative. CT abd/pelvis revealed moderate colonic stool burden without obstruction or other acute intraabdominal or pelvic pathology. There was mention of small lucency adjacent to thoracic aorta suspected to be due to trapped air from prior pneumomediastinum. IV fluid was started to correct free water deficit and the patient was admitted. Sodium level has improved slowly, though encephalopathy remains. He has become much more aggressive, given ativan, and psychiatry consulted.   Assessment & Plan: Principal Problem:   Hypernatremia Active Problems:   Bipolar I disorder, current or most recent episode manic, severe (HCC)   Pneumomediastinum (HCC)   Dehydration   BMI 45.0-49.9, adult (HCC)   Acute metabolic encephalopathy   AKI (acute kidney injury) ([place])  Acute metabolic encephalopathy:  - UDS, salicylate and EtOH were negative. No infectious identified.  - Correct metabolic derangements as below - Appreciate psychiatry assistance. - Continue precautions to maintain patient and staff safety -Overall mentation has been improving, but patient remains angry, agitated and irritable and hostile to staff.   Hypernatremia, AKI, dehydration: -  It's possible the patient's behavioral issues led to poor hydration or that dehydration worsened metabolic parameters leading to encephalopathy. Less suspicion for DI with undetectable lithium level. -Hyponatremia improving, continue with D5W 100 cc,  monitor labs closely, oral intake remains unreliable -Continue to monitor closely, creatinine is improving, it is 1.14 today.    Hyperkalemia/hypokalemia -Hyperkalemia resolved, potassium is low at 3 today, he received supplements.    Mania in bipolar 1 disorder:  - Lithium undetectable, ?if event precipitated by nonadherence - Ativan IV prn ordered -Patient remains with significant mood/anger outbursts,  as needed Ativan has been used with some success. -Management per psychiatry, given his known history of NMS due to multiple antipsychotics in the past, he was started on Triptil today for mood control.  Pneumomediastinum: Chronic, noted 2019, appears to be resolving. Not an active issue.   Morbid obesity: Previous BMI was 50.66.  - Weigh patient when able to do so safely.   Leukocytosis: Unclear etiology, though there is evidence of hemoconcentration. No pyuria or infection on CT's. - Without fever or meningismus or other localizing signs/symptoms, will monitor off abx for now.    Thrombocytopenia:  - Monitor. No bleeding currently  DVT prophylaxis: Heparin Code Status: Full Family Communication: None at bedside Disposition Plan: Back to jail, likely in 1 to 2 days. Status is: Inpatient  Remains inpatient appropriate because: Guards at bedside, ultimate disposition will be back to jail  Consultants:  Psychiatry  Procedures:  None  Antimicrobials: None   Subjective: Patient with multiple episodes of verbal abuse to hospital staff and guards at bedside. Objective: Vitals:   02/22/21 2025 02/23/21 0020 02/23/21 0428 02/23/21 1156  BP: 118/60 (!) 112/59 112/68 123/78  Pulse: 67 68 66 64  Resp: 16 15 16 20   Temp: (!) 97.3 F (36.3 C) 98.3 F (36.8 C) 98.7 F (37.1 C) 98.5 F (36.9 C)  TempSrc: Oral Oral Axillary Axillary  SpO2: 95% 95% 91% 94%  Weight:  123.9 kg     Intake/Output Summary (Last 24 hours) at 02/23/2021 1332 Last data filed at 02/23/2021  [id number] Gross per 24 hour  Intake 1080 ml  Output 400 ml  Net 680 ml       Awake, restless, using foul language, using profanity, but allowed me to listen to his lungs, clear to auscultation, regular rate and rhythm, abdomen is benign, soft, no suprapubic tenderness.      Data Reviewed: I have personally reviewed following labs and imaging studies  CBC: Recent Labs  Lab 02/19/21 2229 02/21/21 0227 02/22/21 0042  WBC 12.9* 11.3* 11.6*  NEUTROABS 8.6*  --  5.9  HGB 16.5 17.4* 16.7  HCT 53.0* 57.1* 54.0*  MCV 85.8 87.0 85.4  PLT 126* 114* 95*   Basic Metabolic Panel: Recent Labs  Lab 02/21/21 1047 02/21/21 1625 02/21/21 2220 02/22/21 0042 02/23/21 0118  NA 152* 150* 148* 148* 145  K 3.7 3.7 4.0 5.0 3.0*  CL 113* 112* 114* 112* 109  CO2 25 23 21* 19* 27  GLUCOSE 112* 124* 121* 74 136*  BUN 24* 21* 18 19 12   CREATININE 1.27* 1.32* 1.37* 1.39* 1.14  CALCIUM 10.0 9.8 9.2 9.5 9.1   GFR: Estimated Creatinine Clearance: 133 mL/min (by C-G formula based on SCr of 1.14 mg/dL). Liver Function Tests: Recent Labs  Lab 02/19/21 2229 02/22/21 0042  AST 68* 85*  ALT 67* 75*  ALKPHOS 54 55  BILITOT 1.7* 2.4*  PROT 8.1 7.5  ALBUMIN 4.4 3.9   No results for input(s): LIPASE, AMYLASE in the last 168 hours. No results for input(s): AMMONIA in the last 168 hours. Coagulation Profile: No results for input(s): INR, PROTIME in the last 168 hours. Cardiac Enzymes: Recent Labs  Lab 02/20/21 1826  CKTOTAL 1,083*   BNP (last 3 results) No results for input(s): PROBNP in the last 8760 hours. HbA1C: No results for input(s): HGBA1C in the last 72 hours.  CBG: Recent Labs  Lab 02/20/21 1030 02/20/21 1240  GLUCAP 105* 102*   Lipid Profile: No results for input(s): CHOL, HDL, LDLCALC, TRIG, CHOLHDL, LDLDIRECT in the last 72 hours. Thyroid Function Tests: No results for input(s): TSH, T4TOTAL, FREET4, T3FREE, THYROIDAB in the last 72 hours. Anemia Panel: No results for  input(s): VITAMINB12, FOLATE, FERRITIN, TIBC, IRON, RETICCTPCT in the last 72 hours. Urine analysis:    Component Value Date/Time   COLORURINE AMBER (A) 02/20/2021 0600   APPEARANCEUR CLOUDY (A) 02/20/2021 0600   LABSPEC 1.029 02/20/2021 0600   PHURINE 5.0 02/20/2021 0600   GLUCOSEU NEGATIVE 02/20/2021 0600   HGBUR SMALL (A) 02/20/2021 0600   BILIRUBINUR NEGATIVE 02/20/2021 0600   KETONESUR 5 (A) 02/20/2021 0600   PROTEINUR 30 (A) 02/20/2021 0600   NITRITE NEGATIVE 02/20/2021 0600   LEUKOCYTESUR NEGATIVE 02/20/2021 0600   No results found for this or any previous visit (from the past 240 hour(s)).    Radiology Studies: No results found.  Scheduled Meds:  heparin  5,000 Units Subcutaneous Q8H   OXcarbazepine  150 mg Oral BID   potassium chloride  40 mEq Oral Q6H   senna-docusate  2 tablet Oral BID   Continuous Infusions:  dextrose 125 mL/hr at 02/23/21 1249     LOS: 3 days     Phillips Climes, MD [facility] [email or web address] 02/23/2021, 1:32 PM Patient ID: Dennis Signs, male   DOB: May 20, 1990, 31 y.o.   MRN: [id number] Patient ID: Yutaro Rinehart, male   DOB: 07/25/1990, 31 y.o.   MRN: [id number]

## 2021-02-23 NOTE — TOC Progression Note (Signed)
Transition of Care [facility]) - Progression Note    Patient Details  Name: Brandon Fuentes MRN: [id number] Date of Birth: Nov 06, 1990  Transition of [facility] CM/SW [place], RN Phone Number: 02/23/2021, 3:55 PM  Clinical Narrative:    I spoke to Erda at [facility]. She  states it is ok for patient to return to the jail once medically stable. The jail does have psychiatry to follow patient post discharge.  Elmyra Ricks requests RN-CM to call her to notify her of discharge.  [phone]         Expected Discharge Plan and Services                                                 Social Determinants of Health (SDOH) Interventions    Readmission Risk Interventions No flowsheet data found.

## 2021-02-23 NOTE — Consult Note (Signed)
Brief Psychiatry Consult Note  Caroll Cunnington is a 31 y.o. male patient with past psychiatric history of bipolar rhabdomyolysis, and NMS (2/2 antipsychotics) presented to ED from [place] jail for not communicating well, not eating and drinking well.  Patient found to have hypernatremia with sodium 163 and AKI with creatinine 1.7.  Psych consult placed for mania with history of NMS.   The patient was last seen by the psychiatry service on 1/24. Interim documentation by primary team and nursing staff has been reviewed.  Pt is seen today for follow up. Pt is again angry, agitated, irritable, making threats and racial slurs to me and Attending Dr. Gasper Sells.  He refuses to answer other questions.  Discussed starting Trileptal but patient would not listen.  Per nursing staff, Pt is uncooperative and refused to eat food and medications this morning.  Called jail multiple times since yesterday [phone]  No answer. Left messages. Patient Recommendations -We will start  Trileptal 150 mg twice daily from tonight for mood stabilization. - Discharge to jail (which has options for psychiatric care) as soon as medically appropriate.    Dr [phone] MD  [facility] Psychiatry.

## 2021-02-24 LAB — BASIC METABOLIC PANEL
Anion gap: 9 (ref 5–15)
BUN: 7 mg/dL (ref 6–20)
CO2: 27 mmol/L (ref 22–32)
Calcium: 8.6 mg/dL — ABNORMAL LOW (ref 8.9–10.3)
Chloride: 102 mmol/L (ref 98–111)
Creatinine, Ser: 0.93 mg/dL (ref 0.61–1.24)
GFR, Estimated: >60 mL/min (ref >60)
Glucose, Bld: 118 mg/dL — ABNORMAL HIGH (ref 70–99)
Potassium: 3.2 mmol/L — ABNORMAL LOW (ref 3.5–5.1)
Sodium: 138 mmol/L (ref 135–145)

## 2021-02-24 MED ORDER — SODIUM CHLORIDE 0.9 % IV BOLUS
1000.0000 mL | Freq: Once | INTRAVENOUS | Status: AC
  Administered 2021-02-24: 1000 mL via INTRAVENOUS

## 2021-02-24 MED ORDER — POTASSIUM CHLORIDE CRYS ER 20 MEQ PO TBCR
40.0000 meq | EXTENDED_RELEASE_TABLET | ORAL | Status: DC
  Filled 2021-02-24: qty 2

## 2021-02-24 MED ORDER — HYDROXYZINE HCL 50 MG PO TABS: 50.0000 mg | ORAL_TABLET | Freq: Four times a day (QID) | ORAL | 0 refills | Status: AC | PRN

## 2021-02-24 MED ORDER — OXCARBAZEPINE 150 MG PO TABS: 150.0000 mg | ORAL_TABLET | Freq: Two times a day (BID) | ORAL | Status: AC

## 2021-02-24 NOTE — Plan of Care (Signed)

## 2021-02-24 NOTE — Discharge Instructions (Addendum)
Follow with psychiatric physician at facility.   Activity: As tolerated    Disposition county [place]   Diet: Regular diet, please encourage fluid intake.   On your next visit with your primary care physician please Get Medicines reviewed and adjusted.

## 2021-02-24 NOTE — Discharge Summary (Signed)
Physician Discharge Summary  Brandon Fuentes [id number] DOB: [birth year] DOA: 02/19/2021  PCP: Patient, No Pcp Per (Inactive)  Admit date: 02/19/2021 Discharge date: 02/24/2021  Admitted From: [facility] Disposition:  [facility]  Recommendations for Outpatient Follow-up:  Please obtain BMP/CBC in one week Patient to follow with a psychiatrist at the facility   Discharge Condition:Stable CODE STATUS: Full Diet recommendation: Regular  Brief/Interim Summary:  Brandon Fuentes is a 31 y.o. 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 He appeared dehydrated and altered, with AKI (SCr 1.7, BUN 38) and hypernatremia (Na 163). CT head was negative. CT abd/pelvis revealed moderate colonic stool burden without obstruction or other acute intraabdominal or pelvic pathology. There was mention of small lucency adjacent to thoracic aorta suspected to be due to trapped air from prior pneumomediastinum. IV fluid was started to correct free water deficit and the patient was admitted. Sodium level has improved slowly, and normalized, as well encephalopathy has improved, but he remained aggressive, angry, agitated and irritable, making threats to staff and guard through the entire hospital stay. -AKI has resolved, and hyponatremia has resolved as well, overall mood has mildly improved as well.  Acute metabolic encephalopathy:  - UDS, salicylate and EtOH were negative. No infectious identified.  Is most likely in the setting of dehydration, hyponatremia, and metabolic derangements, as well as due to underlining Patrik diagnosis. -Overall encephalopathy has improved with correction of dehydration and hypernatremia, but he remains irritable, agitated and hostile due to underlining psychiatric  diagnoses.  Hypernatremia, AKI, dehydration: -  It's possible the patient's behavioral issues led to poor hydration or that dehydration worsened metabolic parameters leading to encephalopathy. Less suspicion for DI with undetectable lithium level. -Hyponatremia has resolved with IV fluid, sodium 163 on admission, it is 138 on discharge, please encourage to increase fluid /water intake  -Continue to monitor closely, creatinine is improving, evaded 1.7 on admission, 0.9 on discharge   Hyperkalemia/hypokalemia -Hyperkalemia resolved, was followed by hyponatremia, which was repleted.     Mania in bipolar 1 disorder:  - Lithium undetectable, does not appear to be on any lithium prior to admission.  He was kept on as needed Ativan during hospital stay. -Patient remains with significant mood/anger outbursts,  as needed Ativan has been used with some success. -Management per psychiatry, given his known history of NMS due to multiple antipsychotics in the past, he was started on Triptil 1/26, this can be uptitrated as needed by facility psychiatrist. Given his history of NMSC due to multiple antipsychotic in the past it was avoided during hospital stay, but it was felt by psychiatry he had NMS very likely as he was on multiple medications at 1 time and very likely high doses.   Pneumomediastinum: Chronic, noted 2019, appears to be resolving. Not an active issue.     Leukocytosis:  Unclear etiology, though there is evidence of hemoconcentration. No pyuria or infection on CT's. - Without fever or meningismus or other localizing signs/symptoms, monitor off antibiotics, no evidence of infection, this has improved, white blood cell is 11.6 at time of discharge .   Thrombocytopenia:  -Platelet is 95K on discharge.    Discharge Diagnoses:  Principal Problem:   Hypernatremia Active Problems:   Bipolar I disorder, current or most recent episode manic, severe (HCC)   Pneumomediastinum ([place])  Dehydration   BMI 45.0-49.9, adult (HCC)   Acute metabolic encephalopathy   AKI (acute kidney injury) [facility])    Discharge Instructions  Discharge Instructions     Diet - low sodium heart healthy   Complete by: As directed    Discharge instructions   Complete by: As directed    Follow with psychiatric physician at facility.   Activity: As tolerated    Disposition county [place]   Diet: Regular diet, please encourage fluid intake.   On your next visit with your primary care physician please Get Medicines reviewed and adjusted   Increase activity slowly   Complete by: As directed       Allergies as of 02/24/2021       Reactions   Atomoxetine Other (See Comments)   Increased agitation, due to Neuroleptic malignant syndrome (NMS)   Haloperidol Lactate Other (See Comments)   Per mother--caused NMS neuroleptic malignant syndrome   Olanzapine Other (See Comments)   Neuroleptic malignant syndrome (NMS) after use   Valproic Acid Other (See Comments)   Cannot take, due to Neuroleptic malignant syndrome (NMS)    Chlorpromazine Other (See Comments)   Drug-induced liver injury   Depakote [divalproex Sodium] Other (See Comments)   Sleepiness, anger (Patient has Neuroleptic malignant syndrome- NMS)    Geodon [ziprasidone Hcl] Other (See Comments)   Per mom, patient gets very agitated   Haloperidol Other (See Comments)   Per mother--caused NMS neuroleptic malignant syndrome   Ketorolac Other (See Comments)   Cannot take, due to Neuroleptic malignant syndrome (NMS)   Risperidone    Per mother caused Neuroleptic malignant syndrome   Risperidone And Related Other (See Comments)   Per mother caused Neuroleptic malignant syndrome   Zoloft [sertraline Hcl] Other (See Comments)   Anger (patient has Neuroleptic malignant syndrome (NMS))        Medication List     TAKE these medications    feeding supplement Liqd Take 237 mLs by mouth 2 (two) times daily between meals.    hydrOXYzine 50 MG tablet Commonly known as: ATARAX Take 1 tablet (50 mg total) by mouth every 6 (six) hours as needed (agitation). What changed: when to take this   OXcarbazepine 150 MG tablet Commonly known as: TRILEPTAL Take 1 tablet (150 mg total) by mouth 2 (two) times daily.        Allergies  Allergen Reactions   Atomoxetine Other (See Comments)    Increased agitation, due to Neuroleptic malignant syndrome (NMS)    Haloperidol Lactate Other (See Comments)    Per mother--caused NMS neuroleptic malignant syndrome   Olanzapine Other (See Comments)    Neuroleptic malignant syndrome (NMS) after use   Valproic Acid Other (See Comments)    Cannot take, due to Neuroleptic malignant syndrome (NMS)     Chlorpromazine Other (See Comments)    Drug-induced liver injury   Depakote [Divalproex Sodium] Other (See Comments)    Sleepiness, anger (Patient has Neuroleptic malignant syndrome- NMS)    Geodon [Ziprasidone Hcl] Other (See Comments)    Per mom, patient gets very agitated   Haloperidol Other (See Comments)    Per mother--caused NMS neuroleptic malignant syndrome   Ketorolac Other (See Comments)    Cannot take, due to Neuroleptic malignant syndrome (NMS)   Risperidone     Per mother caused Neuroleptic malignant syndrome   Risperidone And Related Other (See Comments)    Per mother caused Neuroleptic malignant syndrome   Zoloft [Sertraline Hcl] Other (See Comments)  Anger (patient has Neuroleptic malignant syndrome (NMS))    Consultations: Psychiatry   Procedures/Studies: CT ABDOMEN PELVIS WO CONTRAST  Result Date: 02/20/2021 CLINICAL DATA:  Abdominal pain. EXAM: CT ABDOMEN AND PELVIS WITHOUT CONTRAST TECHNIQUE: Multidetector CT imaging of the abdomen and pelvis was performed following the standard protocol without IV contrast. RADIATION DOSE REDUCTION: This exam was performed according to the departmental dose-optimization program which includes automated exposure  control, adjustment of the mA and/or kV according to patient size and/or use of iterative reconstruction technique. COMPARISON:  CT dated 04/26/2017. FINDINGS: Evaluation of this exam is limited in the absence of intravenous contrast. Lower chest: Minimal bibasilar atelectasis. Small crescentic lucency adjacent to the descending thoracic aorta may represent small amount of pneumomediastinum or trapped air from prior pneumomediastinum. No intra-abdominal free air or free fluid. Hepatobiliary: No focal liver abnormality is seen. No gallstones, gallbladder wall thickening, or biliary dilatation. Pancreas: Unremarkable. No pancreatic ductal dilatation or surrounding inflammatory changes. Spleen: Normal in size without focal abnormality. Adrenals/Urinary Tract: The adrenal glands are unremarkable. The kidneys, visualized ureters, and urinary bladder appear unremarkable. Stomach/Bowel: Moderate stool throughout the colon. No bowel obstruction or active inflammation. The appendix is normal. Vascular/Lymphatic: The abdominal aorta and IVC unremarkable on this noncontrast CT. No portal venous gas. There is no adenopathy. Reproductive: The prostate and seminal vesicles are grossly unremarkable. No pelvic mass. Other: None Musculoskeletal: No acute or significant osseous findings. IMPRESSION: 1. No acute intra-abdominal or pelvic pathology. 2. Moderate colonic stool burden. No bowel obstruction. Normal appendix. 3. Small crescentic lucency adjacent to the descending thoracic aorta may represent small amount of pneumomediastinum or trapped air from prior pneumomediastinum. Electronically Signed   By: Anner Crete M.D.   On: 02/20/2021 00:45   CT Head Wo Contrast  Result Date: 02/19/2021 CLINICAL DATA:  Mental status change EXAM: CT HEAD WITHOUT CONTRAST TECHNIQUE: Contiguous axial images were obtained from the base of the skull through the vertex without intravenous contrast. RADIATION DOSE REDUCTION: This exam was  performed according to the departmental dose-optimization program which includes automated exposure control, adjustment of the mA and/or kV according to patient size and/or use of iterative reconstruction technique. COMPARISON:  CT brain 04/26/2017 FINDINGS: Brain: No evidence of acute infarction, hemorrhage, hydrocephalus, extra-axial collection or mass lesion/mass effect. Vascular: No hyperdense vessel or unexpected calcification. Skull: Normal. Negative for fracture or focal lesion. Sinuses/Orbits: Retention cyst in the right maxillary sinus. Chronic appearing right nasal bone deformity Other: None IMPRESSION: Negative non contrasted CT appearance of the brain Electronically Signed   By: Donavan Foil M.D.   On: 02/19/2021 23:09      Subjective: No significant events overnight as discussed with staff, patient himself denies any complaint  Discharge Exam: Vitals:   02/24/21 0852 02/24/21 1214  BP: 105/73 108/67  Pulse: 61 68  Resp: 15 16  Temp:    SpO2: 94% 90%   Vitals:   02/23/21 2341 02/24/21 0400 02/24/21 0852 02/24/21 1214  BP: 108/66 112/75 105/73 108/67  Pulse: 68 65 61 68  Resp: 18 14 15 16   Temp: 97.8 F (36.6 C) (!) 97.1 F (36.2 C)    TempSrc: Oral Oral    SpO2: 92% 93% 94% 90%  Weight:        General: Pt is alert, awake, not in acute distress.  He kept his eyes closed during interaction and exam. Cardiovascular: RRR, S1/S2 +, no rubs, no gallops Respiratory: CTA bilaterally, no wheezing, no rhonchi Abdominal: Soft, NT, ND, bowel sounds +  Extremities: no edema, no cyanosis    The results of significant diagnostics from this hospitalization (including imaging, microbiology, ancillary and laboratory) are listed below for reference.     Microbiology: No results found for this or any previous visit (from the past 240 hour(s)).   Labs: BNP (last 3 results) No results for input(s): BNP in the last 8760 hours. Basic Metabolic Panel: Recent Labs  Lab  02/21/21 1625 02/21/21 2220 02/22/21 0042 02/23/21 0118 02/24/21 0610  NA 150* 148* 148* 145 138  K 3.7 4.0 5.0 3.0* 3.2*  CL 112* 114* 112* 109 102  CO2 23 21* 19* 27 27  GLUCOSE 124* 121* 74 136* 118*  BUN 21* 18 19 12 7   CREATININE 1.32* 1.37* 1.39* 1.14 0.93  CALCIUM 9.8 9.2 9.5 9.1 8.6*   Liver Function Tests: Recent Labs  Lab 02/19/21 2229 02/22/21 0042  AST 68* 85*  ALT 67* 75*  ALKPHOS 54 55  BILITOT 1.7* 2.4*  PROT 8.1 7.5  ALBUMIN 4.4 3.9   No results for input(s): LIPASE, AMYLASE in the last 168 hours. No results for input(s): AMMONIA in the last 168 hours. CBC: Recent Labs  Lab 02/19/21 2229 02/21/21 0227 02/22/21 0042  WBC 12.9* 11.3* 11.6*  NEUTROABS 8.6*  --  5.9  HGB 16.5 17.4* 16.7  HCT 53.0* 57.1* 54.0*  MCV 85.8 87.0 85.4  PLT 126* 114* 95*   Cardiac Enzymes: Recent Labs  Lab 02/20/21 1826  CKTOTAL 1,083*   BNP: Invalid input(s): POCBNP CBG: Recent Labs  Lab 02/20/21 1030 02/20/21 1240  GLUCAP 105* 102*   D-Dimer No results for input(s): DDIMER in the last 72 hours. Hgb A1c No results for input(s): HGBA1C in the last 72 hours. Lipid Profile No results for input(s): CHOL, HDL, LDLCALC, TRIG, CHOLHDL, LDLDIRECT in the last 72 hours. Thyroid function studies No results for input(s): TSH, T4TOTAL, T3FREE, THYROIDAB in the last 72 hours.  Invalid input(s): FREET3 Anemia work up No results for input(s): VITAMINB12, FOLATE, FERRITIN, TIBC, IRON, RETICCTPCT in the last 72 hours. Urinalysis    Component Value Date/Time   COLORURINE AMBER (A) 02/20/2021 0600   APPEARANCEUR CLOUDY (A) 02/20/2021 0600   LABSPEC 1.029 02/20/2021 0600   PHURINE 5.0 02/20/2021 0600   GLUCOSEU NEGATIVE 02/20/2021 0600   HGBUR SMALL (A) 02/20/2021 0600   BILIRUBINUR NEGATIVE 02/20/2021 0600   KETONESUR 5 (A) 02/20/2021 0600   PROTEINUR 30 (A) 02/20/2021 0600   NITRITE NEGATIVE 02/20/2021 0600   LEUKOCYTESUR NEGATIVE 02/20/2021 0600   Sepsis  Labs Invalid input(s): PROCALCITONIN,  WBC,  LACTICIDVEN Microbiology No results found for this or any previous visit (from the past 240 hour(s)).   Time coordinating discharge: Over 30 minutes  SIGNED:   Phillips Climes, MD  [facility] 02/24/2021, 12:44 PM Pager   If 7PM-7AM, please contact night-coverage [email or web address] Password [email or web address]

## 2021-02-24 NOTE — Consult Note (Signed)
Brief Psychiatry Consult Note  Brandon Fuentes is a 31 y.o. male patient with past psychiatric history of bipolar rhabdomyolysis, and NMS (2/2 antipsychotics) presented to ED from [place] jail for not communicating well, not eating and drinking well.  Patient found to have hypernatremia with sodium 163 and AKI with creatinine 1.7.  Psych consult placed for mania with history of NMS.   The patient was last seen by the psychiatry service on 1/26. Interim documentation by primary team and nursing staff has been reviewed.  Patient was started on Trileptal last night but he refused to take. Pt is seen briefly today for follow up. Pt was sleeping.  Per Officer, Pt was cursing to staff. Per Primary, Pt will be discharged back to jail soon.  Recommendations -Recommend to continue Trileptal 150 mg twice daily for mood stabilization. - Discharge to jail (which has options for psychiatric care) as soon as medically appropriate.  - Recommend Jail to consider a referral to [facility] as it would be more appropriate for him for long term placement given his aggressiveness, h/o bipolar and noncompliance with meds.    Dr Armando Reichert MD  [facility].

## 2021-03-07 ENCOUNTER — Emergency Department (HOSPITAL_COMMUNITY)
Admission: EM | Admit: 2021-03-07 | Discharge: 2021-03-15 | Disposition: A | Discharge status: Home / Self Care | Attending: Emergency Medicine | Admitting: Emergency Medicine

## 2021-03-07 DIAGNOSIS — F3163 Bipolar disorder, current episode mixed, severe, without psychotic features: Secondary | ICD-10-CM | POA: Insufficient documentation

## 2021-03-07 DIAGNOSIS — Z046 Encounter for general psychiatric examination, requested by authority: Secondary | ICD-10-CM | POA: Diagnosis present

## 2021-03-07 DIAGNOSIS — E876 Hypokalemia: Secondary | ICD-10-CM | POA: Insufficient documentation

## 2021-03-07 DIAGNOSIS — F29 Unspecified psychosis not due to a substance or known physiological condition: Secondary | ICD-10-CM | POA: Diagnosis not present

## 2021-03-07 DIAGNOSIS — F250 Schizoaffective disorder, bipolar type: Secondary | ICD-10-CM | POA: Insufficient documentation

## 2021-03-07 DIAGNOSIS — F22 Delusional disorders: Secondary | ICD-10-CM | POA: Insufficient documentation

## 2021-03-07 DIAGNOSIS — Z20822 Contact with and (suspected) exposure to covid-19: Secondary | ICD-10-CM | POA: Insufficient documentation

## 2021-03-07 DIAGNOSIS — Z9114 Patient's other noncompliance with medication regimen: Secondary | ICD-10-CM | POA: Diagnosis not present

## 2021-03-07 DIAGNOSIS — F319 Bipolar disorder, unspecified: Secondary | ICD-10-CM | POA: Diagnosis not present

## 2021-03-07 DIAGNOSIS — Z79899 Other long term (current) drug therapy: Secondary | ICD-10-CM | POA: Diagnosis not present

## 2021-03-07 HISTORY — DX: Personal history of other diseases of the nervous system and sense organs: Z86.69

## 2021-03-07 LAB — URINALYSIS, ROUTINE W REFLEX MICROSCOPIC
Bacteria, UA: NONE SEEN
Bilirubin Urine: NEGATIVE
Glucose, UA: NEGATIVE mg/dL
Hgb urine dipstick: NEGATIVE
Ketones, ur: 20 mg/dL — AB
Leukocytes,Ua: NEGATIVE
Nitrite: NEGATIVE
Protein, ur: 30 mg/dL — AB
Specific Gravity, Urine: 1.014 (ref 1.005–1.030)
pH: 6 (ref 5.0–8.0)

## 2021-03-07 LAB — CBC WITH DIFFERENTIAL/PLATELET
Abs Immature Granulocytes: 0.02 10*3/uL (ref 0.00–0.07)
Basophils Absolute: 0 10*3/uL (ref 0.0–0.1)
Basophils Relative: 1 %
Eosinophils Absolute: 0.3 10*3/uL (ref 0.0–0.5)
Eosinophils Relative: 3 %
HCT: 37.3 % — ABNORMAL LOW (ref 39.0–52.0)
Hemoglobin: 12.6 g/dL — ABNORMAL LOW (ref 13.0–17.0)
Immature Granulocytes: 0 %
Lymphocytes Relative: 33 %
Lymphs Abs: 2.6 10*3/uL (ref 0.7–4.0)
MCH: 27.2 pg (ref 26.0–34.0)
MCHC: 33.8 g/dL (ref 30.0–36.0)
MCV: 80.4 fL (ref 80.0–100.0)
Monocytes Absolute: 0.9 10*3/uL (ref 0.1–1.0)
Monocytes Relative: 12 %
Neutro Abs: 4.1 10*3/uL (ref 1.7–7.7)
Neutrophils Relative %: 51 %
Platelets: 231 10*3/uL (ref 150–400)
RBC: 4.64 MIL/uL (ref 4.22–5.81)
RDW: 16 % — ABNORMAL HIGH (ref 11.5–15.5)
WBC: 8 10*3/uL (ref 4.0–10.5)
nRBC: 0 % (ref 0.0–0.2)

## 2021-03-07 LAB — ETHANOL: Alcohol, Ethyl (B): <10 mg/dL (ref <10)

## 2021-03-07 LAB — COMPREHENSIVE METABOLIC PANEL
ALT: 50 U/L — ABNORMAL HIGH (ref 0–44)
AST: 42 U/L — ABNORMAL HIGH (ref 15–41)
Albumin: 3.7 g/dL (ref 3.5–5.0)
Alkaline Phosphatase: 46 U/L (ref 38–126)
Anion gap: 12 (ref 5–15)
BUN: 8 mg/dL (ref 6–20)
CO2: 22 mmol/L (ref 22–32)
Calcium: 8.9 mg/dL (ref 8.9–10.3)
Chloride: 103 mmol/L (ref 98–111)
Creatinine, Ser: 1.01 mg/dL (ref 0.61–1.24)
GFR, Estimated: >60 mL/min (ref >60)
Glucose, Bld: 94 mg/dL (ref 70–99)
Potassium: 3 mmol/L — ABNORMAL LOW (ref 3.5–5.1)
Sodium: 137 mmol/L (ref 135–145)
Total Bilirubin: 1.9 mg/dL — ABNORMAL HIGH (ref 0.3–1.2)
Total Protein: 6.8 g/dL (ref 6.5–8.1)

## 2021-03-07 LAB — RESP PANEL BY RT-PCR (FLU A&B, COVID) ARPGX2
Influenza A by PCR: NEGATIVE
Influenza B by PCR: NEGATIVE
SARS Coronavirus 2 by RT PCR: NEGATIVE

## 2021-03-07 LAB — ACETAMINOPHEN LEVEL: Acetaminophen (Tylenol), Serum: <10 ug/mL — ABNORMAL LOW (ref 10–30)

## 2021-03-07 LAB — SALICYLATE LEVEL: Salicylate Lvl: <7 mg/dL — ABNORMAL LOW (ref 7.0–30.0)

## 2021-03-07 MED ORDER — POTASSIUM CHLORIDE CRYS ER 20 MEQ PO TBCR
40.0000 meq | EXTENDED_RELEASE_TABLET | Freq: Once | ORAL | Status: DC
  Filled 2021-03-07 (×2): qty 2

## 2021-03-07 NOTE — ED Provider Notes (Signed)
[facility]-EMERGENCY DEPT Provider Note   CSN: [id number] Arrival date & time: 03/07/21  1613     History  Chief Complaint  Patient presents with   Psychiatric Evaluation    Brandon Fuentes is a 31 y.o. male.  This is a 31 y.o. male  with significant medical history as below, including bipolar 1, medication non-compliance who presents to the ED from jail.  Per DOC/PD at bedside patient has been refusing medications, not eating or drinking.  Acting abnormally, erratically.  Aggressive.  He was given sedating medications prior to arrival in the emergency department.  History of similar episodes in the past when he stopped his medications abruptly.  Low suspicion for illicit drug or etoh use per PD.  Unable to obtain significant history from the patient.  When asked the patient what brings to the hospital he replies by saying "the [facility]" and will not elaborate any further.   Level 5 caveat, psychiatric disturbance    Past Medical History: No date: Bipolar 1 disorder (HCC)  No past surgical history on file.    The history is provided by the patient and the police. No language interpreter was used.      Home Medications Prior to Admission medications   Medication Sig Start Date End Date Taking? Authorizing Provider  OXcarbazepine (TRILEPTAL) 150 MG tablet Take 1 tablet (150 mg total) by mouth 2 (two) times daily. 02/24/21  Yes Elgergawy, Leana Roe, MD  feeding supplement, ENSURE ENLIVE, (ENSURE ENLIVE) LIQD Take 237 mLs by mouth 2 (two) times daily between meals. Patient not taking: Reported on 03/07/2021 06/19/17   Rolly Salter, MD  hydrOXYzine (ATARAX) 50 MG tablet Take 1 tablet (50 mg total) by mouth every 6 (six) hours as needed (agitation). Patient not taking: Reported on 03/07/2021 02/24/21   Elgergawy, Leana Roe, MD      Allergies    Atomoxetine, Haloperidol lactate, Olanzapine, Valproic acid, Chlorpromazine, Depakote [divalproex sodium], Geodon  [ziprasidone hcl], Haloperidol, Ketorolac, Risperidone, Risperidone and related, and Zoloft [sertraline hcl]    Review of Systems   Review of Systems  Unable to perform ROS: Psychiatric disorder   Physical Exam Updated Vital Signs BP 136/82 (BP Location: Right Arm)    Pulse 72    Temp 98.3 F (36.8 C) (Oral)    Resp 18    SpO2 98%  Physical Exam Vitals and nursing note reviewed.  Constitutional:      General: He is not in acute distress.    Appearance: Normal appearance. He is well-developed. He is not ill-appearing, toxic-appearing or diaphoretic.  HENT:     Head: Normocephalic and atraumatic.     Right Ear: External ear normal.     Left Ear: External ear normal.     Mouth/Throat:     Mouth: Mucous membranes are moist.  Eyes:     General: No scleral icterus. Cardiovascular:     Rate and Rhythm: Normal rate and regular rhythm.     Pulses: Normal pulses.     Heart sounds: Normal heart sounds.  Pulmonary:     Effort: Pulmonary effort is normal. No respiratory distress.     Breath sounds: Normal breath sounds.  Abdominal:     General: Abdomen is flat.     Palpations: Abdomen is soft.     Tenderness: There is no abdominal tenderness.  Musculoskeletal:        General: Normal range of motion.     Cervical back: Normal range of motion.  Right lower leg: No edema.     Left lower leg: No edema.  Skin:    General: Skin is warm and dry.     Capillary Refill: Capillary refill takes less than 2 seconds.  Neurological:     Mental Status: He is alert.     Comments: Moving extremities spontaneously, extremities will cross midline, non-compliant with neuro exam  Psychiatric:        Mood and Affect: Mood normal.        Behavior: Behavior is uncooperative and hyperactive.        Thought Content: Thought content is paranoid.    ED Results / Procedures / Treatments   Labs (all labs ordered are listed, but only abnormal results are displayed) Labs Reviewed  COMPREHENSIVE METABOLIC  PANEL - Abnormal; Notable for the following components:      Result Value   Potassium 3.0 (*)    AST 42 (*)    ALT 50 (*)    Total Bilirubin 1.9 (*)    All other components within normal limits  CBC WITH DIFFERENTIAL/PLATELET - Abnormal; Notable for the following components:   Hemoglobin 12.6 (*)    HCT 37.3 (*)    RDW 16.0 (*)    All other components within normal limits  SALICYLATE LEVEL - Abnormal; Notable for the following components:   Salicylate Lvl [id number] (*)    All other components within normal limits  ACETAMINOPHEN LEVEL - Abnormal; Notable for the following components:   Acetaminophen (Tylenol), Serum <10 (*)    All other components within normal limits  URINALYSIS, ROUTINE W REFLEX MICROSCOPIC - Abnormal; Notable for the following components:   Ketones, ur 20 (*)    Protein, ur 30 (*)    All other components within normal limits  RESP PANEL BY RT-PCR (FLU A&B, COVID) ARPGX2  ETHANOL  RAPID URINE DRUG SCREEN, HOSP PERFORMED    EKG EKG Interpretation  Date/Time:  Tuesday March 07 2021 17:04:55 EST Ventricular Rate:  77 PR Interval:  158 QRS Duration: 98 QT Interval:  434 QTC Calculation: 491 R Axis:   60 Text Interpretation: Normal sinus rhythm Possible Anterior infarct , age undetermined Abnormal ECG When compared with ECG of 07-Mar-2021 17:04, PREVIOUS ECG IS PRESENT no stemi similar to prior tracings Confirmed by Wynona Dove ([id number]) on 03/08/2021 12:51:09 AM  Radiology No results found.  Procedures Procedures    Medications Ordered in ED Medications  potassium chloride SA (KLOR-CON M) CR tablet 40 mEq (40 mEq Oral Patient Refused/Not Given 03/07/21 2318)    ED Course/ Medical Decision Making/ A&P                           Medical Decision Making Amount and/or Complexity of Data Reviewed Labs: ordered.  Risk Prescription drug management.    CC: psych  This patient presents to the Emergency Department for the above complaint. This involves an  extensive number of treatment options and is a complaint that carries with it a high risk of complications and morbidity. Vital signs were reviewed. Serious etiologies considered.  Record review:  Previous records obtained and reviewed   Additional history obtained from PD/DOC  Medical and surgical history as noted above.   Work up as above, notable for:  Lab results that were available during my care of the patient were reviewed by me and considered in my medical decision making.   Social determinants of health include - inmate/prison  Labs reviewed,  mild ketonuria and hypokalemia. K replaced orally. Tolerating PO.   Management: Received ativan/benadryl prior to my assessment. Pt appears acutely psychotic. Will place under IVC.   Pt medically cleared, pending psychiatric assessment and final disposition. Pt is resting comfortably, not requiring any further sedating medications. Continues to not cooperate with care. Pt is under IVC. HDS.      This chart was dictated using voice recognition software.  Despite best efforts to proofread,  errors can occur which can change the documentation meaning.         Final Clinical Impression(s) / ED Diagnoses Final diagnoses:  Hypokalemia  Psychosis, unspecified psychosis type [facility])    Rx / DC Orders ED Discharge Orders     None         Jeanell Sparrow, DO 03/08/21 [phone]

## 2021-03-07 NOTE — ED Triage Notes (Signed)
Patient to  [facility]  by EMS and sheriff. Patient from jail.  Reported not drinking or eating.  Patient rambling.  Patient received IM ativan and benadryl before transport.

## 2021-03-08 LAB — RAPID URINE DRUG SCREEN, HOSP PERFORMED
Amphetamines: NOT DETECTED
Barbiturates: NOT DETECTED
Benzodiazepines: NOT DETECTED
Cocaine: NOT DETECTED
Opiates: NOT DETECTED
Tetrahydrocannabinol: NOT DETECTED

## 2021-03-08 MED ORDER — PALIPERIDONE ER 3 MG PO TB24: 3.0000 mg | ORAL_TABLET | Freq: Every day | ORAL | Status: DC

## 2021-03-08 MED ORDER — PALIPERIDONE ER 1.5 MG PO TB24: 1.5000 mg | ORAL_TABLET | Freq: Every day | ORAL | Status: DC

## 2021-03-08 MED ORDER — PALIPERIDONE ER 1.5 MG PO TB24
1.5000 mg | ORAL_TABLET | Freq: Once | ORAL | Status: DC
  Filled 2021-03-08 (×2): qty 1

## 2021-03-08 MED ORDER — LORAZEPAM 2 MG/ML IJ SOLN
2.0000 mg | Freq: Once | INTRAMUSCULAR | Status: AC
  Administered 2021-03-08: 2 mg via INTRAMUSCULAR
  Filled 2021-03-08: qty 1

## 2021-03-08 MED ORDER — PALIPERIDONE ER 3 MG PO TB24
3.0000 mg | ORAL_TABLET | Freq: Every day | ORAL | Status: DC
  Administered 2021-03-09 – 2021-03-10 (×2): 3 mg via ORAL
  Filled 2021-03-08 (×2): qty 1

## 2021-03-08 MED ORDER — OXCARBAZEPINE 150 MG PO TABS
150.0000 mg | ORAL_TABLET | Freq: Two times a day (BID) | ORAL | Status: DC
  Administered 2021-03-08 – 2021-03-09 (×2): 150 mg via ORAL
  Filled 2021-03-08 (×3): qty 1

## 2021-03-08 MED ORDER — ARIPIPRAZOLE 5 MG PO TABS: 5.0000 mg | ORAL_TABLET | Freq: Every day | ORAL | Status: DC

## 2021-03-08 MED ORDER — ARIPIPRAZOLE 2 MG PO TABS: 2.0000 mg | ORAL_TABLET | Freq: Once | ORAL | Status: DC

## 2021-03-08 MED ORDER — LORAZEPAM 2 MG/ML IJ SOLN
1.0000 mg | Freq: Once | INTRAMUSCULAR | Status: AC
  Administered 2021-03-08: 1 mg via INTRAMUSCULAR
  Filled 2021-03-08: qty 1

## 2021-03-08 NOTE — ED Notes (Signed)
Patient is refusing to take PO medication. Will not allow RN to obtain VS. Patient becoming more agitated and cursing, screaming. MD notified

## 2021-03-08 NOTE — ED Notes (Signed)
Day shift RN was contacted regarding dose of Invega. She reported that pharmacy never brought medication to unit. This RN looked all through nursing desk and unable to find.

## 2021-03-08 NOTE — ED Notes (Signed)
Patient engages in mostly rambling conversation with scattered profanity when staff enters. No aggressive motions noted at this time

## 2021-03-08 NOTE — ED Notes (Signed)
Pt alert this shift. Disorganized and rambling. Frequent outbursts of cursing. Compliant with medications and VS. No aggression noted

## 2021-03-08 NOTE — BH Assessment (Signed)
[facility] Assessment Progress Note   Per Caryn Bee, NP, this pt requires psychiatric hospitalization at this time.  Please note that this pt comes to [facility] from the local jail, where he is currently serving a sentence.  He is reportedly on the [facility] wait list for the forensic unit.  For this reason this writer has also referred pt to [facility].  Pt presents under IVC initiated by EDP Tanda Rockers, DO.  Referral information has been faxed to [facility] and at 13:53 Vaughan Basta confirms receipt, simultaneously accepting demographic information verbally.  Vaughan Basta is not, however, able to confirm that pt is on the wait list for the forensic unit.  Please note that this pt has not been accepted to the facility as of this writing; a final decision is pending.   Doylene Canning, MA Triage Specialist [phone]

## 2021-03-08 NOTE — ED Notes (Signed)
Patient continues to scream loudly, curse and non stop talking after Ativan IM

## 2021-03-08 NOTE — BH Assessment (Addendum)
Comprehensive Clinical Assessment (CCA) Note  03/08/2021 Brandon Fuentes [id number]  Disposition: Per Sheran Fava, DNP, patient to remain in the ED until the jail/legal system is able to coordinate with [facility] a transfer to the [facility] forensics unit. [name] will discuss and coordinate this process further with the sheriff/[employer] today. As patient awaits forensic placement at [facility], the [facility] provider will provide med management.   Flowsheet Row ED from 03/07/2021 in [facility] DEPT ED to Hosp-Admission (Discharged) from 02/19/2021 in [facility] ED from 01/05/2021 in [facility] No Risk No Risk No Risk      The patient demonstrates the following risk factors for suicide: Chronic risk factors for suicide include: Bipolar I Disorder, Current Episode Mixed, Severe w/o psychotic features. Acute risk factors for suicide include: family or marital conflict and legal system involvement . Protective factors for this patient include:  n/a . Considering these factors, the overall suicide risk at this point appears to be "No Risk". Patient is appropriate for outpatient follow up after psych cleared by a [facility] provider.   Chief Complaint:  Chief Complaint  Patient presents with   Psychiatric Evaluation   Visit Diagnosis: Bipolar I Disorder, Current Episode Mixed, Severe w/o psychotic features  Brandon Fuentes is a 31 y.o. male. Unable to obtain significant history from the patient. Pt presents with multiple sx of mania, including being hyperverbal, tangential & hyper-religious.When asked the patient what brings to the hospital he replies by saying "the Reita Cliche" and will not elaborate any further.  The H&P notes: "This is a 32 y.o. male  with significant medical history as below, including bipolar 1, medication non-compliance who presents to the ED from jail. Per DOC/PD at bedside patient has been refusing  medications, not eating or drinking.  Acting abnormally, erratically.  Aggressive.  He was given sedating medications prior to arrival in the emergency department.  History of similar episodes in the past when he stopped his medications abruptly.  Low suspicion for illicit drug or etoh use per PD."   Clinician met with patient via tele assessment and was unable to obtain significant history from the patient.  When asked the patient what brings to the hospital he replies by saying "the Reita Cliche" and will not elaborate any further.   Denies current suicidal ideations. Denies prior attempts and/or gestures to harm self. Denies history of self-mutilating behaviors. Patient denies depressive symptoms. Patient references statements religious in nature but it was difficult to understand statements made. He reports sleeping well since he has been in the ED. He reports an increase in 4-5 hrs versus 2-3 hrs. Appetite is good. He denies any significant weight loss and/or gain. Denies HI. Denies AVH's. Denies symptoms of paranoia. Overall, patient is evidently hyper religious as he mentions religion multiple times throughout today's assessment Denies history of alcohol and/or drug use. UDS is noted as negative. BAL also negative.   Overall, patient was oriented to person, place, time, and situation. He was calm and cooperative. Goal directed. Speech at times unclear/garbled. However, when asked to clarify or repeat his responses he spoke in a clearer tone. Affect was intermittently flat. Insight and judgement are poor. Impulse control was appropriate. Recent and remote memory appear to be intact. Upon admission patient was seemingly a poor historian. During today's reassessment he displays some improvement in providing this Clinician with history.  He did not appear to be responding to internal stimuli. He does  appear mildly delusional as he directed a lot of focus to religion.   CCA Screening, Triage and Referral  (STR)  Patient Reported Information How did you hear about [place]? Family/Friend  What Is the Reason for Your Visit/Call Today? Brandon Fuentes is a 31 y.o. male.     This is a 31 y.o. male  with significant medical history as below, including bipolar 1, medication non-compliance who presents to the ED from jail.     Per DOC/PD at bedside patient has been refusing medications, not eating or drinking.  Acting abnormally, erratically.  Aggressive.  He was given sedating medications prior to arrival in the emergency department.  History of similar episodes in the past when he stopped his medications abruptly.  Low suspicion for illicit drug or etoh use per PD.     Unable to obtain significant history from the patient.  When asked the patient what brings to the hospital he replies by saying "the Reita Cliche" and will not elaborate any further.  How Long Has This Been Causing You Problems? 1-6 months  What Do You Feel Would Help You the Most Today? Treatment for Depression or other mood problem   Have You Recently Had Any Thoughts About Hurting Yourself? No  Are You Planning to Commit Suicide/Harm Yourself At This time? No   Have you Recently Had Thoughts About [place]? No  Are You Planning to Harm Someone at This Time? No  Explanation: No data recorded  Have You Used Any Alcohol or Drugs in the Past 24 Hours? No  How Long Ago Did You Use Drugs or Alcohol? No data recorded What Did You Use and How Much? No data recorded  Do You Currently Have a Therapist/Psychiatrist? No  Name of Therapist/Psychiatrist: No data recorded  Have You Been Recently Discharged From Any Office Practice or Programs? No  Explanation of Discharge From Practice/Program: No data recorded    CCA Screening Triage Referral Assessment Type of Contact: Tele-Assessment  Telemedicine Service Delivery: Telemedicine service delivery: This service was provided via telemedicine using a 2-way, interactive audio and video  technology  Is this Initial or Reassessment? Initial Assessment  Date Telepsych consult ordered in [facility]:  03/08/21  Time Telepsych consult ordered in [facility]:  0421  Location of Assessment: [facility] ED  Provider Location: [facility]   Collateral Involvement: Mother, Darolyn Rua [phone]   Does Patient Have a Court Appointed Legal Guardian? No data recorded Name and Contact of Legal Guardian: No data recorded If Minor and Not Living with Parent(s), Who has Custody? No data recorded Is CPS involved or ever been involved? Never  Is APS involved or ever been involved? Never   Patient Determined To Be At Risk for Harm To Self or Others Based on Review of Patient Reported Information or Presenting Complaint? No  Method: No data recorded Availability of Means: No data recorded Intent: No data recorded Notification Required: No data recorded Additional Information for Danger to Others Potential: No data recorded Additional Comments for Danger to Others Potential: No data recorded Are There Guns or Other Weapons in Your Home? No data recorded Types of Guns/Weapons: No data recorded Are These Weapons Safely Secured?                            No data recorded Who Could Verify You Are Able To Have These Secured: No data recorded Do You Have any Outstanding Charges, Pending Court Dates,  Parole/Probation? No data recorded Contacted To Inform of Risk of Harm To Self or Others: No data recorded   Does Patient Present under Involuntary Commitment? No  IVC Papers Initial File Date: No data recorded  [place] of Residence: [place]   Patient Currently Receiving the Following Services: Medication Management; Individual Therapy   Determination of Need: Emergent (2 hours)   Options For Referral: Medication Management; Inpatient Hospitalization     CCA Biopsychosocial Patient Reported Schizophrenia/Schizoaffective Diagnosis in Past: No   Strengths:  Unconditional love from God, Mom and family, smart   Mental Health Symptoms Depression:   Change in energy/activity; Difficulty Concentrating; Increase/decrease in appetite; Sleep (too much or little); Tearfulness   Duration of Depressive symptoms:  Duration of Depressive Symptoms: Greater than two weeks   Mania:   Change in energy/activity; Increased Energy; Euphoria; Racing thoughts; Overconfidence; Recklessness   Anxiety:    Difficulty concentrating; Fatigue; Restlessness; Tension; Worrying   Psychosis:   Hallucinations   Duration of Psychotic symptoms:  Duration of Psychotic Symptoms: Greater than six months   Trauma:   None   Obsessions:   N/A   Compulsions:   N/A   Inattention:   N/A   Hyperactivity/Impulsivity:   Feeling of restlessness   Oppositional/Defiant Behaviors:   Aggression towards people/animals; Defies rules   Emotional Irregularity:   Intense/inappropriate anger; Potentially harmful impulsivity   Other Mood/Personality Symptoms:  No data recorded   Mental Status Exam Appearance and self-care  Stature:   Tall   Weight:   Average weight   Clothing:   Careless/inappropriate   Grooming:   Normal   Cosmetic use:   None   Posture/gait:   Tense   Motor activity:   Restless   Sensorium  Attention:   Distractible   Concentration:   Preoccupied   Orientation:   X5   Recall/memory:   Normal   Affect and Mood  Affect:   Labile   Mood:   Hypomania; Euphoric   Relating  Eye contact:   Normal   Facial expression:   Responsive   Attitude toward examiner:   Cooperative; Dramatic; Silly   Thought and Language  Speech flow:  Pressured   Thought content:   Ideas of Reference; Delusions; Ideas of Influence   Preoccupation:   Religion   Hallucinations:   Visual   Organization:  No data recorded  [employer] of Knowledge:   Good   Intelligence:   Average   Abstraction:   Functional    Judgement:   Impaired   Reality Testing:   Distorted   Insight:   Gaps   Decision Making:   Impulsive   Social Functioning  Social Maturity:   Impulsive   Social Judgement:   Impropriety; Heedless   Stress  Stressors:   Other (Comment)   Coping Ability:   Resilient; Deficient supports   Skill Deficits:   Decision making   Supports:   Family     Religion: Religion/Spirituality Are You A Religious Person?: Yes  Leisure/Recreation: Leisure / Recreation Do You Have Hobbies?: Yes Leisure and Hobbies: basketball, read, sing, write songs, help people- from previous assessment  Exercise/Diet: Exercise/Diet Do You Exercise?: No Have You Gained or Lost A Significant Amount of Weight in the Past Six Months?: No Do You Follow a Special Diet?: No Do You Have Any Trouble Sleeping?: Yes Explanation of Sleeping Difficulties: 3-4 hours q hs recently   CCA Employment/Education Employment/Work Situation: Employment / Work Situation Employment Situation: Radio broadcast assistant  Job has Been Impacted by Current Illness: Yes Describe how Patient's Job has Been Impacted: mania  Has Patient ever Been in the [place]?: No  Education: Education Is Patient Currently Attending School?: Yes School Currently Attending: [employer] Last Grade Completed: 12 Did You Attend College?: Yes What Type of College Degree Do you Have?: graduating from [employer] this semester Did You Have An Individualized Education Program (IIEP): No Did You Have Any Difficulty At School?: No Patient's Education Has Been Impacted by Current Illness: Yes How Does Current Illness Impact Education?: mania   CCA Family/Childhood History Family and Relationship History: Family history Does patient have children?: No  Childhood History:  Childhood History By whom was/is the patient raised?: Mother Did patient suffer any verbal/emotional/physical/sexual abuse as a child?: No Did patient suffer from severe childhood  neglect?: No Has patient ever been sexually abused/assaulted/raped as an adolescent or adult?: No Was the patient ever a victim of a crime or a disaster?: No Witnessed domestic violence?: No Has patient been affected by domestic violence as an adult?: No  Child/Adolescent Assessment:     CCA Substance Use Alcohol/Drug Use: Alcohol / Drug Use Pain Medications: See MAR Over the Counter: See MAR History of alcohol / drug use?: No history of alcohol / drug abuse Longest period of sobriety (when/how long): NA Negative Consequences of Use: Personal relationships                         ASAM's:  Six Dimensions of Multidimensional Assessment  Dimension 1:  Acute Intoxication and/or Withdrawal Potential:      Dimension 2:  Biomedical Conditions and Complications:      Dimension 3:  Emotional, Behavioral, or Cognitive Conditions and Complications:     Dimension 4:  Readiness to Change:     Dimension 5:  Relapse, Continued use, or Continued Problem Potential:     Dimension 6:  Recovery/Living Environment:     ASAM Severity Score:    ASAM Recommended Level of Treatment:     Substance use Disorder (SUD)    Recommendations for Services/Supports/Treatments: Recommendations for Services/Supports/Treatments Recommendations For Services/Supports/Treatments: Inpatient Hospitalization, Other (Comment), Medication Management ([facility])  Discharge Disposition:    DSM5 Diagnoses: Patient Active Problem List   Diagnosis Date Noted   Acute metabolic encephalopathy 02/77/4128   AKI (acute kidney injury) ([place]) 02/20/2021   Mania ([place])    BMI 45.0-49.9, adult ([place]) 05/30/2020   Primary hypertension 03/17/2020   Dehydration 06/17/2017   Hypernatremia 06/17/2017   Increased anion gap metabolic acidosis 78/67/6720   Rhabdomyolysis 05/15/2017   Pneumomediastinum ([place])    Pneumopericardium    Elevated LFTs    Bipolar disorder, curr episode mixed, severe, w/o psychotic  features ([place]) 04/10/2016   Bipolar I disorder, current or most recent episode manic, severe ([place]) 04/01/2016   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

## 2021-03-08 NOTE — ED Notes (Signed)
Patient has finally fallen asleep. VS will be obtained when patient awakens

## 2021-03-08 NOTE — ED Provider Notes (Signed)
Emergency Medicine Observation Re-evaluation Note  Brandon Fuentes is a 31 y.o. male, seen on rounds today.  Pt initially presented to the ED for complaints of Psychiatric Evaluation Currently, the patient is awake, alert, requesting food.  Physical Exam  BP (!) 148/74 (BP Location: Left Leg)    Pulse 68    Temp 98.7 F (37.1 C) (Oral)    Resp 19    SpO2 100%  Physical Exam General: Awake, well-appearing Cardiac: Extremities well perfused Lungs: Unlabored breathing Psych: Not agitated, evidence of grandiosity  ED Course / MDM  EKG:EKG Interpretation  Date/Time:  Tuesday March 07 2021 17:04:55 EST Ventricular Rate:  77 PR Interval:  158 QRS Duration: 98 QT Interval:  434 QTC Calculation: 491 R Axis:   60 Text Interpretation: Normal sinus rhythm Possible Anterior infarct , age undetermined Abnormal ECG When compared with ECG of 07-Mar-2021 17:04, PREVIOUS ECG IS PRESENT no stemi similar to prior tracings Confirmed by Wynona Dove ([id number]) on 03/08/2021 12:51:09 AM  I have reviewed the labs performed to date as well as medications administered while in observation.  Recent changes in the last 24 hours include patient presented to the ED yesterday from jail.  He is reportedly refusing to eat, drink, or take medications and acting strangely.  He was medically cleared.  Currently awaiting TTS evaluation.  Plan  Current plan is for TTS evaluation. Garrit Angeli is under involuntary commitment.        Godfrey Pick, MD 03/08/21 [phone]

## 2021-03-08 NOTE — BH Assessment (Signed)
Pt made contact with pt's nurse, Carollee Herter RN, in an effort to complete pt's MH Assessment. Pt's nurse stated pt has just fallen asleep and has been talking non-stop with bizarre rambling since she arrived at 1900. Pt's nurse stated she would contact TTS when pt wakes up. TTS to attempt assessment at a later time.

## 2021-03-08 NOTE — Consult Note (Signed)
°  Patient meets inpatient psychiatric criteria.    Psychiatry to continue to follow at this time. Unfortunately he continues to meet inpatient criteria as he has high risk factors to harm himself and or others. He has had 4 ER visit in the past 3 months, and chart review continues to show he is declining and decompensating requiring need for inpatient. Patient with early decompensation of schizophrenia as evident by his precipitating events leading to admission in the hospital, ongoing paranoia, worsening symptoms, Self care and neglect, difficulty adapting to normal stressors, and previous medications no longer working.   He needs inpatient for acute hospital medication management, crisis stabilization, and decompensating schizophrenia.    male with a psychiatric history significant for schizoaffective disorder bipolar type, who presented to the ED from jail for medication noncompliance and behaviors. As he has been off his medications can not rule out some decompensation of his schizoaffective disorder, however, also can not rule out escalating behaviors 2/2 secondary gain to get out of jail.  Collateral obtained from Mr. Brandon Fuentes (Court liaison) [phone] states patient has been on [facility] referral list since December 16.  They are willing to continue to work with patient if he is able to be stabilized in the emergency department, however would like a mental health behavior plan and orders for appropriate medication management.  Also discussed with Dr. Ervin Fuentes (psychiatric provider through [place] [facility] station at [facility]) discussion regarding formulary and importance of continuity of care with limited psychotropic medications due to patient's allergy and contraindications list.  She did advise that Abilify oral and Abilify Maintena are available, in addition to paliperidone oral and paliperidone extended release, in addition to paliperidone trinza.   Considering  patient's history of NMS, while on multiple antipsychotic medication it is unclear which medication caused a reaction or for was multiple antipsychotics given around the same time that caused the reaction.  Will start low and go slow in any case.  Considering patient's psychiatric history, current diagnosis of schizoaffective disorder, current legal charges to include aggression, assault by strangulation and increase in and volatile behaviors will initiate medication that will benefit and target these behaviors more.  After careful discussion with Dr. Lucianne Fuentes will start paliperidone oral with the goal of transitioning to long-acting injectable.  Patient will need to demonstrate oral tolerability for minimum of 5 to 7 days, prior to administration of long-acting injectable which will need further monitoring.  Due to patient's history of NMS, he remains at high risk for developing NMS With when initiated in psychotropic medication, increasing of dosage, multiple psychotropic medication therefore will provide daily assessments, physical examination, intermittent and routine labs during this hospitalization for patient wellbeing and health.  -Will start paliperidone 1.5 mg p.o. and a single dose Will increase paliperidone 3 mg p.o. daily starting tomorrow.

## 2021-03-08 NOTE — ED Notes (Signed)
Breakfast given.  

## 2021-03-08 NOTE — ED Notes (Signed)
Pt loudly laughing and talking nonsense

## 2021-03-09 MED ORDER — ZIPRASIDONE MESYLATE 20 MG IM SOLR
INTRAMUSCULAR | Status: AC
  Administered 2021-03-09: 20 mg via INTRAMUSCULAR
  Filled 2021-03-09: qty 20

## 2021-03-09 MED ORDER — DIPHENHYDRAMINE HCL 50 MG/ML IJ SOLN
50.0000 mg | Freq: Four times a day (QID) | INTRAMUSCULAR | Status: AC | PRN
  Administered 2021-03-09 – 2021-03-10 (×2): 50 mg via INTRAMUSCULAR
  Filled 2021-03-09 (×2): qty 1

## 2021-03-09 MED ORDER — STERILE WATER FOR INJECTION IJ SOLN
INTRAMUSCULAR | Status: AC
  Administered 2021-03-09: 1.2 mL via INTRAMUSCULAR
  Filled 2021-03-09: qty 10

## 2021-03-09 MED ORDER — OXCARBAZEPINE 300 MG PO TABS
300.0000 mg | ORAL_TABLET | Freq: Two times a day (BID) | ORAL | Status: DC
  Administered 2021-03-09 – 2021-03-15 (×11): 300 mg via ORAL
  Filled 2021-03-09 (×12): qty 1

## 2021-03-09 MED ORDER — LORAZEPAM 2 MG/ML IJ SOLN
2.0000 mg | Freq: Four times a day (QID) | INTRAMUSCULAR | Status: AC | PRN
  Administered 2021-03-09 – 2021-03-10 (×2): 2 mg via INTRAMUSCULAR
  Filled 2021-03-09 (×2): qty 1

## 2021-03-09 NOTE — ED Notes (Signed)
Patient resting comfortably at this time.  Patient ate breakfast.  Patient woke up agitated and rambling. Patient refused lunch.

## 2021-03-09 NOTE — Consult Note (Signed)
°  Patient unable to be assessed today was asleep on both attempts today. Per chart review he received an order for Geodon IM at 540am this morning. Order for Geodon is not populating as it appears to be a nurse override. Patient was then administered his daily dose of Paliperidone 3mg  po daily in addition to his IM Geodon. As noted previously patient is at very high risk for developing neuroleptic malignant syndrome when administered multiple neuroleptics and antipsychotic medications to inlcude Haldol, Geodon, ZYprexa, Thorazine.  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  Will make best attempt to stabilize patient in the emergency room, with 1 antipsychotic medication.  Please avoid any additional antipsychotic medications at this time.  -In the event patient becomes psychotic, acutely aggressive and or combative may administer IM Ativan and or Benadryl.  Per nursing documentation" patient has occasional outbursts of profanities and continues to refuse to allow staff to change his linen in a wet with urine."  Is not justifiable for administering IM psychotropic medication in a patient with acute psychosis and behavioral disturbances and knowing diagnosis of schizoaffective disorder.  -Will recommend obtaining as needed as needed medications from EDP or [facility] provider only when needed.  Will not place standing orders for as needed, as medications may be used for other purposes. -We will also obtain order for CBC, CMP, and CK for tomorrow.  We will need to closely follow baseline labs, with the initiation of antipsychotic medication with the goal to transition to long-acting injectable. -Patient continues to remain on [facility] referral list, non priority.  Further review shows patient is not on forensic wait list as previously noted yesterday by Officer Gerald Dexter.  -Psychiatry will continue to follow.

## 2021-03-09 NOTE — ED Notes (Signed)
Patient resting.

## 2021-03-09 NOTE — ED Notes (Signed)
The plan is to medicate patient with Geodon IM the clean patient and change linen once he is more agreeable.

## 2021-03-09 NOTE — ED Notes (Signed)
Seems to be having a conversation about relationships and varius other things with police officer in the room.

## 2021-03-09 NOTE — ED Notes (Signed)
Patient woke up agitated.  Talking. nonsensical . Loud.  Upset.  No able to redirect.

## 2021-03-09 NOTE — ED Notes (Signed)
Pt fed breakfast. Pt corporative at this time. Pt resting comfortably.

## 2021-03-09 NOTE — BH Assessment (Signed)
[facility] Assessment Progress Note   At 10:00 this writer called [name] and spoke to [place].  She reports that pt is on their wait list as a non-priority referral.  He is not, however, on the wait list for their forensic unit.  Doylene Canning, [place] Behavioral Health Coordinator [phone]

## 2021-03-09 NOTE — ED Provider Notes (Signed)
Emergency Medicine Observation Re-evaluation Note  Trip Cavanagh is a 31 y.o. male, seen on rounds today at 0700.  Pt initially presented to the ED for complaints of Psychiatric Evaluation Currently, the patient is resting comfortably.  Physical Exam  BP 123/81    Pulse 60    Temp (!) 97.1 F (36.2 C) (Oral)    Resp 16    SpO2 100%  Physical Exam General: NAD   ED Course / MDM  EKG:EKG Interpretation  Date/Time:  Tuesday March 07 2021 17:04:55 EST Ventricular Rate:  77 PR Interval:  158 QRS Duration: 98 QT Interval:  434 QTC Calculation: 491 R Axis:   60 Text Interpretation: Normal sinus rhythm Possible Anterior infarct , age undetermined Abnormal ECG When compared with ECG of 07-Mar-2021 17:04, PREVIOUS ECG IS PRESENT no stemi similar to prior tracings Confirmed by Tanda Rockers ([id number]) on 03/08/2021 12:51:09 AM  I have reviewed the labs performed to date as well as medications administered while in observation.  Recent changes in the last 24 hours include no reported acute events.  Plan  Current plan is for psych evaluation. Brogan Martis is under involuntary commitment.      [name], MD 03/09/21 1002

## 2021-03-09 NOTE — ED Notes (Signed)
Patient has occasional outbursts of profanities and continues to refuse to allow staff to change his lines that are wet with urine.

## 2021-03-09 NOTE — ED Notes (Signed)
RN attempted to obtain vital signs and change linen/ clean patient. Patient became aggressive and started cursing and yelling when attempting to place pulse ox on finger. Patient clenched hands and was refusing to be touched. RN explain to patient that she was there to change his linens. Patient stated" Get the [facility] away from me bitch". RN walked out of room as that time.

## 2021-03-10 LAB — CBC WITH DIFFERENTIAL/PLATELET
Abs Immature Granulocytes: 0.02 10*3/uL (ref 0.00–0.07)
Basophils Absolute: 0 10*3/uL (ref 0.0–0.1)
Basophils Relative: 0 %
Eosinophils Absolute: 0.6 10*3/uL — ABNORMAL HIGH (ref 0.0–0.5)
Eosinophils Relative: 7 %
HCT: 43.1 % (ref 39.0–52.0)
Hemoglobin: 14.2 g/dL (ref 13.0–17.0)
Immature Granulocytes: 0 %
Lymphocytes Relative: 35 %
Lymphs Abs: 2.8 10*3/uL (ref 0.7–4.0)
MCH: 27.2 pg (ref 26.0–34.0)
MCHC: 32.9 g/dL (ref 30.0–36.0)
MCV: 82.6 fL (ref 80.0–100.0)
Monocytes Absolute: 0.6 10*3/uL (ref 0.1–1.0)
Monocytes Relative: 8 %
Neutro Abs: 3.9 10*3/uL (ref 1.7–7.7)
Neutrophils Relative %: 50 %
Platelets: 237 10*3/uL (ref 150–400)
RBC: 5.22 MIL/uL (ref 4.22–5.81)
RDW: 16.5 % — ABNORMAL HIGH (ref 11.5–15.5)
WBC: 8 10*3/uL (ref 4.0–10.5)
nRBC: 0 % (ref 0.0–0.2)

## 2021-03-10 LAB — COMPREHENSIVE METABOLIC PANEL
ALT: 33 U/L (ref 0–44)
AST: 29 U/L (ref 15–41)
Albumin: 3.3 g/dL — ABNORMAL LOW (ref 3.5–5.0)
Alkaline Phosphatase: 45 U/L (ref 38–126)
Anion gap: 7 (ref 5–15)
BUN: 6 mg/dL (ref 6–20)
CO2: 28 mmol/L (ref 22–32)
Calcium: 9 mg/dL (ref 8.9–10.3)
Chloride: 109 mmol/L (ref 98–111)
Creatinine, Ser: 0.76 mg/dL (ref 0.61–1.24)
GFR, Estimated: >60 mL/min (ref >60)
Glucose, Bld: 100 mg/dL — ABNORMAL HIGH (ref 70–99)
Potassium: 3 mmol/L — ABNORMAL LOW (ref 3.5–5.1)
Sodium: 144 mmol/L (ref 135–145)
Total Bilirubin: 0.7 mg/dL (ref 0.3–1.2)
Total Protein: 6.4 g/dL — ABNORMAL LOW (ref 6.5–8.1)

## 2021-03-10 LAB — CK: Total CK: 246 U/L (ref 49–397)

## 2021-03-10 MED ORDER — LORAZEPAM 2 MG/ML IJ SOLN
2.0000 mg | Freq: Four times a day (QID) | INTRAMUSCULAR | Status: AC | PRN
  Administered 2021-03-12 (×2): 2 mg via INTRAMUSCULAR
  Filled 2021-03-10 (×2): qty 1

## 2021-03-10 MED ORDER — DIPHENHYDRAMINE HCL 50 MG/ML IJ SOLN
50.0000 mg | Freq: Four times a day (QID) | INTRAMUSCULAR | Status: AC | PRN
  Administered 2021-03-12 (×2): 50 mg via INTRAMUSCULAR
  Filled 2021-03-10 (×2): qty 1

## 2021-03-10 MED ORDER — BENZTROPINE MESYLATE 0.5 MG PO TABS: 0.5000 mg | ORAL_TABLET | Freq: Two times a day (BID) | ORAL | Status: DC | PRN

## 2021-03-10 MED ORDER — BACITRACIN ZINC 500 UNIT/GM EX OINT
TOPICAL_OINTMENT | Freq: Two times a day (BID) | CUTANEOUS | Status: DC
  Administered 2021-03-10 – 2021-03-15 (×6): 1 via TOPICAL
  Filled 2021-03-10 (×10): qty 0.9

## 2021-03-10 MED ORDER — PALIPERIDONE ER 3 MG PO TB24
4.5000 mg | ORAL_TABLET | Freq: Every day | ORAL | Status: DC
  Filled 2021-03-10: qty 1

## 2021-03-10 NOTE — ED Notes (Signed)
Attempted to bite down on thermometer probe with last temp check.

## 2021-03-10 NOTE — ED Notes (Signed)
Belfie, EDP at bedside.

## 2021-03-10 NOTE — ED Notes (Signed)
Patient rambling "Im the police" "I'm the medicine".

## 2021-03-10 NOTE — Consult Note (Incomplete)
Telepsych Consultation   Reason for Consult:  psychotic Referring Physician:  Tanda Rockers, DO Location of Patient: [facility] Location of Provider: Behavioral Health TTS Department  Patient Identification: Shamarion Coots MRN:  [id number] Principal Diagnosis: <principal problem not specified> Diagnosis:  Active Problems:   * No active hospital problems. *   Total Time spent with patient: 20 minutes  Subjective:   Aaidyn San is a 31 y.o. male patient admitted with ***.  1100: patient currently asleep and in restraints.   1455: patient remains in restraints  HPI:  ***  Past Psychiatric History: ***  Risk to Self:   Risk to Others:   Prior Inpatient Therapy:   Prior Outpatient Therapy:    Past Medical History:  Past Medical History:  Diagnosis Date   Bipolar 1 disorder (HCC)    No past surgical history on file. Family History:  Family History  Problem Relation Age of Onset   Diabetes Mother    Family Psychiatric  History: *** Social History:  Social History   Substance and Sexual Activity  Alcohol Use No     Social History   Substance and Sexual Activity  Drug Use No    Social History   Socioeconomic History   Marital status: Single    Spouse name: Not on file   Number of children: Not on file   Years of education: Not on file   Highest education level: Not on file  Occupational History   Not on file  Tobacco Use   Smoking status: Never   Smokeless tobacco: Never  Vaping Use   Vaping Use: Never used  Substance and Sexual Activity   Alcohol use: No   Drug use: No   Sexual activity: Yes    Birth control/protection: None  Other Topics Concern   Not on file  Social History Narrative   Not on file   Social Determinants of Health   Financial Resource Strain: Not on file  Food Insecurity: Not on file  Transportation Needs: Not on file  Physical Activity: Not on file  Stress: Not on file  Social Connections: Not on file    Additional Social History:    Allergies:   Allergies  Allergen Reactions   Atomoxetine Other (See Comments)    Increased agitation, due to Neuroleptic malignant syndrome (NMS)    Haloperidol Lactate Other (See Comments)    Per mother--caused NMS neuroleptic malignant syndrome   Olanzapine Other (See Comments)    Neuroleptic malignant syndrome (NMS) after use   Valproic Acid Other (See Comments)    Cannot take, due to Neuroleptic malignant syndrome (NMS)     Chlorpromazine Other (See Comments)    Drug-induced liver injury   Depakote [Divalproex Sodium] Other (See Comments)    Sleepiness, anger (Patient has Neuroleptic malignant syndrome- NMS)    Geodon [Ziprasidone Hcl] Other (See Comments)    Per mom, patient gets very agitated   Haloperidol Other (See Comments)    Per mother--caused NMS neuroleptic malignant syndrome   Ketorolac Other (See Comments)    Cannot take, due to Neuroleptic malignant syndrome (NMS)   Risperidone Other (See Comments)    Per mother caused Neuroleptic malignant syndrome   Risperidone And Related Other (See Comments)    Per mother caused Neuroleptic malignant syndrome   Zoloft [Sertraline Hcl] Other (See Comments)    Anger (patient has Neuroleptic malignant syndrome (NMS))    Labs:  Results for orders placed or performed during the hospital encounter of 03/07/21 (from the  past 48 hour(s))  CBC with Differential     Status: Abnormal   Collection Time: 03/10/21  4:15 AM  Result Value Ref Range   WBC 8.0 4.0 - 10.5 K/uL   RBC 5.22 4.22 - 5.81 MIL/uL   Hemoglobin 14.2 13.0 - 17.0 g/dL   HCT 41.6 38.4 - 53.6 %   MCV 82.6 80.0 - 100.0 fL   MCH 27.2 26.0 - 34.0 pg   MCHC 32.9 30.0 - 36.0 g/dL   RDW 46.8 (H) 03.2 - 12.2 %   Platelets 237 150 - 400 K/uL   nRBC 0.0 0.0 - 0.2 %   Neutrophils Relative % 50 %   Neutro Abs 3.9 1.7 - 7.7 K/uL   Lymphocytes Relative 35 %   Lymphs Abs 2.8 0.7 - 4.0 K/uL   Monocytes Relative 8 %   Monocytes  Absolute 0.6 0.1 - 1.0 K/uL   Eosinophils Relative 7 %   Eosinophils Absolute 0.6 (H) 0.0 - 0.5 K/uL   Basophils Relative 0 %   Basophils Absolute 0.0 0.0 - 0.1 K/uL   Immature Granulocytes 0 %   Abs Immature Granulocytes 0.02 0.00 - 0.07 K/uL    Comment: Performed at [facility], [address] [place], [place] [address]  CK     Status: None   Collection Time: 03/10/21  4:15 AM  Result Value Ref Range   Total CK 246 49 - 397 U/L    Comment: Performed at [facility], [address], [place], [place] [address]  Comprehensive metabolic panel     Status: Abnormal   Collection Time: 03/10/21  4:15 AM  Result Value Ref Range   Sodium 144 135 - 145 mmol/L   Potassium 3.0 (L) 3.5 - 5.1 mmol/L   Chloride 109 98 - 111 mmol/L   CO2 28 22 - 32 mmol/L   Glucose, Bld 100 (H) 70 - 99 mg/dL    Comment: Glucose reference range applies only to samples taken after fasting for at least 8 hours.   BUN 6 6 - 20 mg/dL   Creatinine, Ser 8.88 0.61 - 1.24 mg/dL   Calcium 9.0 8.9 - 91.6 mg/dL   Total Protein 6.4 (L) 6.5 - 8.1 g/dL   Albumin 3.3 (L) 3.5 - 5.0 g/dL   AST 29 15 - 41 U/L   ALT 33 0 - 44 U/L   Alkaline Phosphatase 45 38 - 126 U/L   Total Bilirubin 0.7 0.3 - 1.2 mg/dL   GFR, Estimated >94 >50 mL/min    Comment: (NOTE) Calculated using the CKD-EPI Creatinine Equation (2021)    Anion gap 7 5 - 15    Comment: Performed at [facility], [address], [place], [place] [address]    Medications:  Current Facility-Administered Medications  Medication Dose Route Frequency Provider Last Rate Last Admin   bacitracin ointment   Topical BID Rolan Bucco, MD   1 application at 03/10/21 8003   benztropine (COGENTIN) tablet 0.5 mg  0.5 mg Oral BID PRN Maryagnes Amos, FNP       diphenhydrAMINE (BENADRYL) injection 50 mg  50 mg Intramuscular Q6H PRN Starkes-Perry, Juel Burrow, FNP       LORazepam (ATIVAN) injection 2 mg  2 mg  Intramuscular Q6H PRN Starkes-Perry, Juel Burrow, FNP       Oxcarbazepine (TRILEPTAL) tablet 300 mg  300 mg Oral BID Maryagnes Amos, FNP   300 mg at 03/10/21 0928   [START ON  03/11/2021] paliperidone (INVEGA) 24 hr tablet 4.5 mg  4.5 mg Oral Daily Starkes-Perry, Takia S, FNP       potassium chloride SA (KLOR-CON M) CR tablet 40 mEq  40 mEq Oral Once Sloan Leiter, DO       Current Outpatient Medications  Medication Sig Dispense Refill   OXcarbazepine (TRILEPTAL) 150 MG tablet Take 1 tablet (150 mg total) by mouth 2 (two) times daily.     feeding supplement, ENSURE ENLIVE, (ENSURE ENLIVE) LIQD Take 237 mLs by mouth 2 (two) times daily between meals. (Patient not taking: Reported on 03/07/2021) 237 mL 12   hydrOXYzine (ATARAX) 50 MG tablet Take 1 tablet (50 mg total) by mouth every 6 (six) hours as needed (agitation). (Patient not taking: Reported on 03/07/2021) 30 tablet 0   Musculoskeletal: Strength & Muscle Tone: {desc; muscle tone:32375} Gait & Station: {PE GAIT ED NATF:57322} Patient leans: {Patient Leans:21022755}  Psychiatric Specialty Exam:  Presentation  General Appearance: Disheveled (both hands In handcuffs and both legs in chains)  Eye Contact:Minimal  Speech:Slurred  Speech Volume:Normal  Handedness:Right  Mood and Affect  Mood:Euthymic ("good")  Affect:Restricted; Blunt; Non-Congruent  Thought Process  Thought Processes:Disorganized (responding to internal stimuli)  Descriptions of Associations:Tangential  Orientation:-- (Not able to test. Pt uncooperative)  Thought Content:Tangential (Denies SI, HI, AVH but responding to IS)  History of Schizophrenia/Schizoaffective disorder:No  Duration of Psychotic Symptoms:Greater than six months  Hallucinations:No data recorded Ideas of Reference:None  Suicidal Thoughts:No data recorded Homicidal Thoughts:No data recorded  Sensorium  Memory:Immediate Poor; Remote Poor; Recent  Poor  Judgment:Poor  Insight:Lacking  Executive Functions  Concentration:Poor  Attention Span:Poor  Recall:Poor  Fund of Knowledge:Poor  Language:Poor  Psychomotor Activity  Psychomotor Activity:No data recorded  Assets  Assets:Financial Resources/Insurance; Housing; Physical Health; Resilience; Social Support  Sleep  Sleep:No data recorded  Physical Exam: Physical Exam Vitals and nursing note reviewed.   ROS Blood pressure (!) 148/88, pulse 72, temperature 98.2 F (36.8 C), temperature source Oral, resp. rate 16, SpO2 96 %. There is no height or weight on file to calculate BMI.  Treatment Plan Summary: {CHL [facility] MD [place] GURK:270623762}  Disposition: {CHL [facility] Consult Plan:20772}  This service was provided via telemedicine using a 2-way, interactive audio and video technology.  Names of all persons participating in this telemedicine service and their role in this encounter. Name: Maxie Barb Role: PMHNP  Name: Nelly Rout Role: Attending MD  Name: Lise Auer Role: patient  Name:  Role:     Loletta Parish, NP 03/10/2021 10:27 AM

## 2021-03-10 NOTE — ED Notes (Signed)
Bandage removed from L wrist.  Bacitracin applied and gauze placed under cuff

## 2021-03-10 NOTE — ED Notes (Addendum)
Verbal order given by Belfi, EDP for bacitracin to apply to left wrist for skin breakdown from hand cuffs.

## 2021-03-10 NOTE — ED Notes (Signed)
Per Dr. Zenia Resides - PLEASE CALL BH PROVIDER WITH BEHAVIORAL ISSUES - PLEASE DO  NOT OVERRIDE AND GIVE ANTIPSYCHOTIC INJECTABLES.

## 2021-03-10 NOTE — Consult Note (Signed)
Telepsych Consultation   Reason for Consult:  Psychiatric Evaluation Referring Physician:  Jeanell Sparrow Location of Patient: [facility] Location of Provider: [facility]  Patient Identification: Brandon Fuentes MRN:  [id number] Principal Diagnosis: <Psychosis, unspecified psychosis> Diagnosis:  Active Problems: Bipolar 1 Disorder Schizoaffective disorder (bipolar type)  Total Time spent with patient: 20 minutes  Subjective:   Brandon Fuentes is a 31 y.o. male with a past psychiatric history significant for bipolar 1 disorder and schizoaffective disorder (bipolar type) who is currently admitted to [facility] after presenting from jail.  Prior to being admitted, patient was reported not drinking or eating and was rambling.  When asked the reason for his admission to [facility] ED, patient stated that his mother pressed charges due to his pre-existing conditions and wanted for him to go to the ED instead of jail.  He reports that his mother wanted for him to serve the rest of his jail time in the ED.  Patient reports that he came from [facility] and is unsure of the reason for his incarceration.  Shortly after stating that he did not know the reason for his incarceration, patient stated "My with forging her name on things and it was none of her business and I had to whoop her ass." He added " she took the money and cashed it and pretended she did not have any money so I had to whoop her old ass.  She should not have been in my stuff like that."  Patient reports that he felt fine before being admitted to the ED. Patient asked if he was behaving bizarrely before being admitted to [facility] ED to which the patient replied "no, not at all."  Patient states that he has a diagnosis of bipolar disorder and is taking only lithium.  Patient reports a past history of hospitalization back in December 2022.  Patient denies depressive symptoms or anxiety.  Patient denies  suicidal or homicidal ideations.  He further denies auditory or visual hallucinations.  Patient endorses decreased appetite.  He reports no issues with his sleep.  Patient states that he would like for his restraints to be removed.  HPI:    Brandon Fuentes is a a 31 y.o. male with a past psychiatric history significant for bipolar 1 disorder and schizoaffective disorder (bipolar type) who is currently admitted to [facility] ED from [facility] after refusing to eat, drink, or take his medications.  Prior to his admission to [facility] ED, patient was aggressive and acting erratically. Due to his aggressive and erratic behavior, patient is currently handcuffed to bed.  Patient has a high risk for developing neuroleptic malignant syndrome when administered multiple neuroleptics and antipsychotic medications (Haldol, Geodon, Zyprexa, Thorazine).  Patient has had 3 admissions for neuroleptic malignant syndrome, and has been stabilized with mood stabilizers and benzodiazepine only.  Additional antipsychotics are to be avoided in this patient due to risk of NMS.  In the event of psychotic or aggressive behavior, patient is to be managed through IM Ativan or Benadryl.  During the assessment of the patient, patient is a poor historian and provided seemingly inaccurate information in comparison to notes in patient's chart.  Patient is alert and oriented.  Patient is is aware of the month and year and knows that he is currently admitted in the end ED.  Patient denies experiencing difficulty breathing, diaphoresis, fever, heart rate. Patient denies suicidal or homicidal ideations and does not appear to  be responding to internal/external stimuli.  Patient to continue to remain on [facility] referral list.  Past Psychiatric History:  Schizoaffective disorder (bipolar type) Bipolar Disorder  Risk to Self: Yes Risk to Others: Yes Prior Inpatient Therapy: Patient has has multiple past ED  visit Prior Outpatient Therapy: Unknown  Past Medical History:  Past Medical History:  Diagnosis Date   Bipolar 1 disorder (HCC)    No past surgical history on file. Family History:  Family History  Problem Relation Age of Onset   Diabetes Mother    Family Psychiatric  History:  Unknown  Social History:  Social History   Substance and Sexual Activity  Alcohol Use No     Social History   Substance and Sexual Activity  Drug Use No    Social History   Socioeconomic History   Marital status: Single    Spouse name: Not on file   Number of children: Not on file   Years of education: Not on file   Highest education level: Not on file  Occupational History   Not on file  Tobacco Use   Smoking status: Never   Smokeless tobacco: Never  Vaping Use   Vaping Use: Never used  Substance and Sexual Activity   Alcohol use: No   Drug use: No   Sexual activity: Yes    Birth control/protection: None  Other Topics Concern   Not on file  Social History Narrative   Not on file   Social Determinants of Health   Financial Resource Strain: Not on file  Food Insecurity: Not on file  Transportation Needs: Not on file  Physical Activity: Not on file  Stress: Not on file  Social Connections: Not on file   Additional Social History:    Allergies:   Allergies  Allergen Reactions   Atomoxetine Other (See Comments)    Increased agitation, due to Neuroleptic malignant syndrome (NMS)    Haloperidol Lactate Other (See Comments)    Per mother--caused NMS neuroleptic malignant syndrome   Olanzapine Other (See Comments)    Neuroleptic malignant syndrome (NMS) after use   Valproic Acid Other (See Comments)    Cannot take, due to Neuroleptic malignant syndrome (NMS)     Chlorpromazine Other (See Comments)    Drug-induced liver injury   Depakote [Divalproex Sodium] Other (See Comments)    Sleepiness, anger (Patient has Neuroleptic malignant syndrome- NMS)    Geodon [Ziprasidone  Hcl] Other (See Comments)    Per mom, patient gets very agitated   Haloperidol Other (See Comments)    Per mother--caused NMS neuroleptic malignant syndrome   Ketorolac Other (See Comments)    Cannot take, due to Neuroleptic malignant syndrome (NMS)   Risperidone Other (See Comments)    Per mother caused Neuroleptic malignant syndrome   Risperidone And Related Other (See Comments)    Per mother caused Neuroleptic malignant syndrome   Zoloft [Sertraline Hcl] Other (See Comments)    Anger (patient has Neuroleptic malignant syndrome (NMS))    Labs:  Results for orders placed or performed during the hospital encounter of 03/07/21 (from the past 48 hour(s))  CBC with Differential     Status: Abnormal   Collection Time: 03/10/21  4:15 AM  Result Value Ref Range   WBC 8.0 4.0 - 10.5 K/uL   RBC 5.22 4.22 - 5.81 MIL/uL   Hemoglobin 14.2 13.0 - 17.0 g/dL   HCT 74.9 44.9 - 67.5 %   MCV 82.6 80.0 - 100.0 fL  MCH 27.2 26.0 - 34.0 pg   MCHC 32.9 30.0 - 36.0 g/dL   RDW 16.5 (H) 11.5 - 15.5 %   Platelets 237 150 - 400 K/uL   nRBC 0.0 0.0 - 0.2 %   Neutrophils Relative % 50 %   Neutro Abs 3.9 1.7 - 7.7 K/uL   Lymphocytes Relative 35 %   Lymphs Abs 2.8 0.7 - 4.0 K/uL   Monocytes Relative 8 %   Monocytes Absolute 0.6 0.1 - 1.0 K/uL   Eosinophils Relative 7 %   Eosinophils Absolute 0.6 (H) 0.0 - 0.5 K/uL   Basophils Relative 0 %   Basophils Absolute 0.0 0.0 - 0.1 K/uL   Immature Granulocytes 0 %   Abs Immature Granulocytes 0.02 0.00 - 0.07 K/uL    Comment: Performed at [facility], [place] [address], [place], [place] [address]  [place]     Status: None   Collection Time: 03/10/21  4:15 AM  Result Value Ref Range   Total CK 246 49 - 397 U/L    Comment: Performed at [facility], [place] [address], [place], [place] [address]  Comprehensive metabolic panel     Status: Abnormal   Collection Time: 03/10/21  4:15 AM  Result Value Ref Range   Sodium 144 135 - 145  mmol/L   Potassium 3.0 (L) 3.5 - 5.1 mmol/L   Chloride 109 98 - 111 mmol/L   CO2 28 22 - 32 mmol/L   Glucose, Bld 100 (H) 70 - 99 mg/dL    Comment: Glucose reference range applies only to samples taken after fasting for at least 8 hours.   BUN 6 6 - 20 mg/dL   Creatinine, Ser 0.76 0.61 - 1.24 mg/dL   Calcium 9.0 8.9 - 10.3 mg/dL   Total Protein 6.4 (L) 6.5 - 8.1 g/dL   Albumin 3.3 (L) 3.5 - 5.0 g/dL   AST 29 15 - 41 U/L   ALT 33 0 - 44 U/L   Alkaline Phosphatase 45 38 - 126 U/L   Total Bilirubin 0.7 0.3 - 1.2 mg/dL   GFR, Estimated >60 >60 mL/min    Comment: (NOTE) Calculated using the CKD-EPI Creatinine Equation (2021)    Anion gap 7 5 - 15    Comment: Performed at [facility], [place] [address], [place], [place] [address]    Medications:  Current Facility-Administered Medications  Medication Dose Route Frequency Provider Last Rate Last Admin   bacitracin ointment   Topical BID Malvin Johns, MD   1 application at [id number]   benztropine (COGENTIN) tablet 0.5 mg  0.5 mg Oral BID PRN Suella Broad, FNP       diphenhydrAMINE (BENADRYL) injection 50 mg  50 mg Intramuscular Q6H PRN Starkes-Perry, Gayland Curry, FNP       LORazepam (ATIVAN) injection 2 mg  2 mg Intramuscular Q6H PRN Starkes-Perry, Gayland Curry, FNP       Oxcarbazepine (TRILEPTAL) tablet 300 mg  300 mg Oral BID Suella Broad, FNP   300 mg at 03/10/21 2213   [START ON 03/11/2021] paliperidone (INVEGA) 24 hr tablet 4.5 mg  4.5 mg Oral Daily Starkes-Perry, Takia S, FNP       potassium chloride SA (KLOR-CON M) CR tablet 40 mEq  40 mEq Oral Once Jeanell Sparrow, DO       Current Outpatient Medications  Medication Sig Dispense Refill   OXcarbazepine (TRILEPTAL) 150 MG tablet Take 1 tablet (150 mg total) by  mouth 2 (two) times daily.     feeding supplement, ENSURE ENLIVE, (ENSURE ENLIVE) LIQD Take 237 mLs by mouth 2 (two) times daily between meals. (Patient not taking: Reported on 03/07/2021) 237 mL  12   hydrOXYzine (ATARAX) 50 MG tablet Take 1 tablet (50 mg total) by mouth every 6 (six) hours as needed (agitation). (Patient not taking: Reported on 03/07/2021) 30 tablet 0    Musculoskeletal: Strength & Muscle Tone: within normal limits Gait & Station: unable to stand, during assessment, patient was laying in bed Patient leans: N/A    Psychiatric Specialty Exam:  Presentation  General Appearance: Disheveled; Bizarre  Eye Contact:Minimal  Speech:Garbled  Speech Volume:Normal  Handedness:Right   Mood and Affect  Mood:Euthymic (Fair)  Affect:Blunt; Non-Congruent   Thought Process  Thought Processes:Disorganized  Descriptions of Associations:Loose  Orientation:Full (Time, Place and Person)  Thought Content:Scattered; Illogical  History of Schizophrenia/Schizoaffective disorder:Yes (Per patient's chart, patient was diangosed with Schizoaffective disorder, bipolar type 4 years ago. Patient has a current diagnosis of Bipolar I disorder.)  Duration of Psychotic Symptoms:Greater than six months  Hallucinations:Hallucinations: None  Ideas of Reference:None  Suicidal Thoughts:Suicidal Thoughts: No  Homicidal Thoughts:Homicidal Thoughts: No   Sensorium  Memory:Immediate Poor; Recent Poor; Remote Poor  Judgment:Poor  Insight:Lacking   Executive Functions  Concentration:Fair  Attention Span:Fair  Recall:Poor  Fund of Knowledge:Poor  Language:Fair   Psychomotor Activity  Psychomotor Activity:Psychomotor Activity: Decreased  Assets  Assets:Housing; Social Support; Desire for Improvement; Communication Skills   Sleep  Sleep:Sleep: Good   Physical Exam: Physical Exam Psychiatric:        Attention and Perception: Attention and perception normal. He does not perceive auditory or visual hallucinations.        Mood and Affect: Mood and affect normal. Mood is not anxious or depressed.        Speech: Speech normal.        Behavior: Behavior is  slowed. Behavior is cooperative.        Thought Content: Thought content does not include homicidal or suicidal ideation. Thought content does not include suicidal plan.        Cognition and Memory: Cognition is impaired. Memory is impaired.        Judgment: Judgment is inappropriate.   Review of Systems  Psychiatric/Behavioral:  Negative for depression, hallucinations, memory loss, substance abuse and suicidal ideas. The patient is not nervous/anxious and does not have insomnia.   Blood pressure 137/90, pulse 68, temperature 98 F (36.7 C), temperature source Oral, resp. rate 18, SpO2 98 %. There is no height or weight on file to calculate BMI.  Treatment Plan Summary: Daily contact with patient to assess and evaluate symptoms and progress in treatment, Medication management, and Plan Patient is to remain on [facility] referral list  Disposition: No evidence of imminent risk to self or others at present.   Recommend psychiatric Inpatient admission when medically cleared. Supportive therapy provided about ongoing stressors.  This service was provided via telemedicine using a 2-way, interactive audio and video technology.  Names of all persons participating in this telemedicine service and their role in this encounter. Name: Dr. Zenia Resides Role: Attending Provider  Name: Paticia Stack Role: RN  Name: Ival Bible, MD Role: Supervising Psych Provider  Name: Ileene Musa Role: Physician Assistant    Malachy Mood, PA 03/10/2021 10:43 PM

## 2021-03-10 NOTE — ED Notes (Signed)
Allowed vitals to be taken except for oral temperature. Having occasional loud outbursts of vulgarity.

## 2021-03-10 NOTE — ED Provider Notes (Addendum)
Emergency Medicine Observation Re-evaluation Note  Brandon Fuentes is a 31 y.o. male, seen on rounds today.  Pt initially presented to the ED for complaints of Psychiatric Evaluation Currently, the patient is handcuffed to bed.  Physical Exam  BP (!) 148/88 (BP Location: Left Arm)    Pulse 72    Temp 98.2 F (36.8 C) (Oral)    Resp 16    SpO2 96%  Physical Exam General: Awake and alert, answering questions Cardiac: Normal rate Lungs: No increased work of breathing Psych: Calm  ED Course / MDM  EKG:EKG Interpretation  Date/Time:  Tuesday March 07 2021 17:04:55 EST Ventricular Rate:  77 PR Interval:  158 QRS Duration: 98 QT Interval:  434 QTC Calculation: 491 R Axis:   60 Text Interpretation: Normal sinus rhythm Possible Anterior infarct , age undetermined Abnormal ECG When compared with ECG of 07-Mar-2021 17:04, PREVIOUS ECG IS PRESENT no stemi similar to prior tracings Confirmed by Wynona Dove ([id number]) on 03/08/2021 12:51:09 AM  I have reviewed the labs performed to date as well as medications administered while in observation.  Recent changes in the last 24 hours include has had some intermittent aggressive behavior.  Plan  [facility] Fuentes is under involuntary commitment.  I have reviewed the notes and psychiatry has recommended if patient has any more violent outbursts, to control his behavior with Benadryl and benzos rather than more antipsychotic medications.  Apparently he has had several episodes in the past of NMS and is at risk for this given his ongoing medications.  I also have a concern that patient is still in handcuffs after 65 hours.  At this point, the sheriff's department does not feel comfortable releasing the handcuffs.  They do not feel that the Velcro restraints would contain him.  I have asked the psychiatric team to also give their thoughts on this and maximize his medication management.  At some point he  will need to be out of the handcuffs however this needs to be balanced with the ongoing wrist to staff and himself.  Discussed with Takia.  She is trying to control patient's behavior by slowly increasing his medications.  She said that she is doing it slowly because he has had a history of NMS.  At the point where he is psychiatrically stabilized, he can potentially be discharged to the jail while awaiting placement at [facility].  He is currently not at that point.  She will also reach out to the jail and try to figure out if there is a better safety plan other than continued handcuffs.  Appreciate help from the behavior health team.      Malvin Johns, MD 03/10/21 [id number]    Malvin Johns, MD 03/10/21 1021

## 2021-03-10 NOTE — ED Notes (Signed)
Cleansed left wrist. Bacitracin applied and covered with gauze. Pt repositioned in bed by sheriffs. All extremities checked for skin break down from restraints/hand cuffs. Only left wrist has small visible break down at this time.

## 2021-03-10 NOTE — ED Notes (Signed)
Meds crushed and given in apple juice. Condom catheter placed.

## 2021-03-10 NOTE — BH Assessment (Signed)
[facility] Assessment Progress Note   At 08:33 this writer called [name] and spoke to [place].  He reports that pt is on their wait list as a non-priority referral.   Doylene Canning, MA Behavioral Health Coordinator [phone]

## 2021-03-11 ENCOUNTER — Other Ambulatory Visit: Payer: Self-pay

## 2021-03-11 LAB — COMPREHENSIVE METABOLIC PANEL
ALT: 35 U/L (ref 0–44)
AST: 30 U/L (ref 15–41)
Albumin: 3.2 g/dL — ABNORMAL LOW (ref 3.5–5.0)
Alkaline Phosphatase: 44 U/L (ref 38–126)
Anion gap: 8 (ref 5–15)
BUN: 7 mg/dL (ref 6–20)
CO2: 25 mmol/L (ref 22–32)
Calcium: 8.9 mg/dL (ref 8.9–10.3)
Chloride: 109 mmol/L (ref 98–111)
Creatinine, Ser: 0.62 mg/dL (ref 0.61–1.24)
GFR, Estimated: >60 mL/min (ref >60)
Glucose, Bld: 98 mg/dL (ref 70–99)
Potassium: 3.2 mmol/L — ABNORMAL LOW (ref 3.5–5.1)
Sodium: 142 mmol/L (ref 135–145)
Total Bilirubin: 0.7 mg/dL (ref 0.3–1.2)
Total Protein: 6.3 g/dL — ABNORMAL LOW (ref 6.5–8.1)

## 2021-03-11 LAB — CBC WITH DIFFERENTIAL/PLATELET
Abs Immature Granulocytes: 0.01 10*3/uL (ref 0.00–0.07)
Basophils Absolute: 0 10*3/uL (ref 0.0–0.1)
Basophils Relative: 0 %
Eosinophils Absolute: 0.5 10*3/uL (ref 0.0–0.5)
Eosinophils Relative: 6 %
HCT: 45 % (ref 39.0–52.0)
Hemoglobin: 14.8 g/dL (ref 13.0–17.0)
Immature Granulocytes: 0 %
Lymphocytes Relative: 36 %
Lymphs Abs: 2.9 10*3/uL (ref 0.7–4.0)
MCH: 27.2 pg (ref 26.0–34.0)
MCHC: 32.9 g/dL (ref 30.0–36.0)
MCV: 82.7 fL (ref 80.0–100.0)
Monocytes Absolute: 0.6 10*3/uL (ref 0.1–1.0)
Monocytes Relative: 7 %
Neutro Abs: 4.2 10*3/uL (ref 1.7–7.7)
Neutrophils Relative %: 51 %
Platelets: 246 10*3/uL (ref 150–400)
RBC: 5.44 MIL/uL (ref 4.22–5.81)
RDW: 16.7 % — ABNORMAL HIGH (ref 11.5–15.5)
WBC: 8.2 10*3/uL (ref 4.0–10.5)
nRBC: 0 % (ref 0.0–0.2)

## 2021-03-11 LAB — TSH: TSH: 0.909 u[IU]/mL (ref 0.350–4.500)

## 2021-03-11 LAB — CK: Total CK: 164 U/L (ref 49–397)

## 2021-03-11 MED ORDER — PALIPERIDONE ER 3 MG PO TB24
3.0000 mg | ORAL_TABLET | Freq: Every day | ORAL | Status: AC
  Administered 2021-03-11 – 2021-03-12 (×2): 3 mg via ORAL
  Filled 2021-03-11 (×2): qty 1

## 2021-03-11 MED ORDER — POTASSIUM CHLORIDE CRYS ER 20 MEQ PO TBCR
40.0000 meq | EXTENDED_RELEASE_TABLET | Freq: Every day | ORAL | Status: AC
  Administered 2021-03-11 – 2021-03-13 (×3): 40 meq via ORAL
  Filled 2021-03-11 (×3): qty 2

## 2021-03-11 NOTE — ED Notes (Signed)
Patient up this morning singing in room. 4 point cuffs - restraints in place, skin assessed and bandaged.

## 2021-03-11 NOTE — ED Notes (Signed)
Patient provided with apple juice per request.  Continues to have rambling speech.  Currently cooperative

## 2021-03-11 NOTE — ED Provider Notes (Signed)
Emergency Medicine Observation Re-evaluation Note  Brandon Fuentes is a 31 y.o. male, seen on rounds today.  Pt initially presented to the ED for complaints of Psychiatric Evaluation Currently, the patient is awaiting placement.  Physical Exam  BP (!) 146/97 (BP Location: Left Arm)    Pulse (!) 120    Temp 98.2 F (36.8 C) (Oral)    Resp 18    SpO2 100%  Physical Exam General: handcuffed to the bed Cardiac: rrr Lungs: cta b Psych: rambling   ED Course / MDM  EKG:EKG Interpretation  Date/Time:  Friday March 10 2021 14:10:20 EST Ventricular Rate:  77 PR Interval:  136 QRS Duration: 96 QT Interval:  380 QTC Calculation: 430 R Axis:   72 Text Interpretation: Sinus rhythm with marked sinus arrhythmia Nonspecific T wave abnormality Abnormal ECG When compared with ECG of 07-Mar-2021 17:04, PREVIOUS ECG IS PRESENT since last tracing no significant change Confirmed by Rolan Bucco [phone]) on 03/10/2021 2:15:09 PM  I have reviewed the labs performed to date as well as medications administered while in observation.  Recent changes in the last 24 hours include still in handcuffs.  Psych is slowly increasing his psych meds due to hx of NMS.  Once he is stable, he can come out of the cuffs.  Plan  Current plan is for inpatient placement. Benedicto Capozzi is under involuntary commitment.      Jacalyn Lefevre, MD 03/11/21 [phone]

## 2021-03-11 NOTE — Consult Note (Addendum)
Brandon Fuentes is a 31 year old male with past history of bipolar I severe, Catatonia, Neuroleptic Malignant Syndrome, Catatonia who presented to [facility] 03/07/21 from [facility] for psychiatric evaluation; was found to have hypokalemia. Per chart review patient presented to [facility] 02/19/21 for altered mental status with NA 168 and unresponsive, 01/05/21 [facility] for aggressive behavior from jail.   Patient presents alert and partially oriented to person Brandon Harrison II"), place [facility]), and time (February 2023, "Valentine's month"). He begins making incoherent statements about "[employer]" and "[employer] nutritionist". His mood is elevated; speech is clear, pressured, tangential. Flight of ideas. Thought content delusional, disorganized and processes scattered. Unable to complete full assessment due to current state; obvious  psychotic symptoms. Patient remains in 4 point restraints with officer at the bedside; no obvious distress noted via telepsych. Chest rising and falling equally. Skin appears patent. RN Taquita endorses medication compliance and adequate oral intake.  EDRN note 03/11/21 1518: "Patient calm today but ongoing rambling with flight of ideas. Eating well. Bath today."  EDRN note 03/11/21 0831: "Patient up this morning singing in room. 4 point cuffs - restraints in place, skin assessed and bandaged."  Plan:   -Continue to seek inpatient hospitalization for further  observation, stabilization, and treatment.   -[name] waitlist**   -Medications:   -Benztropine (Cogentin) PO 0.5 mg 2 xs daily  PRN: tremor, EPS  -Diphenhydramine (Benadryl) IM 50 mg q6hr  PRN: agitation   -Lorazepam (Ativan) IM 2 mg q6hr PRN:  sedation, agitation     -Oxcarbazepine (Trileptal) PO 300 mg BID:           mood stabilization  -Paliperidone (Invega) 3 mg PO daily:                       psychotic symptoms       -K+ remains low; possibly increase to 4.5 mg  once K+ within normal range,   history of NMS.   -Potassium Chloride CR PO 40 mEq: hypokalemia  -Labs:        -CMP: K+ 3.2 (L), Albumin 3.2 (L), Total Protein 6.3 (L)   -Continue to monitor CMP, CBC w/diff, CK, EKG   -next labs 03/12/21 0500  -THIS PATIENT IS NOT TO RECEIVE ANY PRN OR IM ANTIPSYCHOTICS NOT CURRENTLY LISTED ON MAR GIVEN HISTORY OF (NMS) NEUROLEPTIC MALIGNANT SYNDROME.

## 2021-03-11 NOTE — ED Notes (Signed)
Patient calm today but ongoing rambling with flight of ideas. Eating well. Bath today.

## 2021-03-12 LAB — COMPREHENSIVE METABOLIC PANEL
ALT: 36 U/L (ref 0–44)
AST: 34 U/L (ref 15–41)
Albumin: 3.4 g/dL — ABNORMAL LOW (ref 3.5–5.0)
Alkaline Phosphatase: 44 U/L (ref 38–126)
Anion gap: 8 (ref 5–15)
BUN: 7 mg/dL (ref 6–20)
CO2: 25 mmol/L (ref 22–32)
Calcium: 9 mg/dL (ref 8.9–10.3)
Chloride: 107 mmol/L (ref 98–111)
Creatinine, Ser: 0.69 mg/dL (ref 0.61–1.24)
GFR, Estimated: >60 mL/min (ref >60)
Glucose, Bld: 93 mg/dL (ref 70–99)
Potassium: 3.4 mmol/L — ABNORMAL LOW (ref 3.5–5.1)
Sodium: 140 mmol/L (ref 135–145)
Total Bilirubin: 0.8 mg/dL (ref 0.3–1.2)
Total Protein: 6.6 g/dL (ref 6.5–8.1)

## 2021-03-12 LAB — CK: Total CK: 142 U/L (ref 49–397)

## 2021-03-12 NOTE — ED Notes (Signed)
Pt scream out in multiple voices when asked if he would like something for sleep he replied " YES YOUR SOUL" . Pt medicated with ativan 2 mg and benadryl 50 mg both IM left deltoid. Pt remains in polices restraints.

## 2021-03-12 NOTE — ED Notes (Signed)
Patient alert. Confused. Rambling . Nonsensical . Patient was alert and agitated . Calling out. Cursing.  Threatening to kill staff.  PRNs given (see MAR) Ativan 2 mg IM given, effective Benadryl 50 mg IM given, effective.

## 2021-03-12 NOTE — ED Notes (Signed)
Sheriff deputy remains at bedside Brandon Fuentes remains very animated  and loud imitates everything he hears. Remains in sheriff custody.

## 2021-03-12 NOTE — ED Provider Notes (Signed)
Emergency Medicine Observation Re-evaluation Note  Brandon Fuentes is a 31 y.o. male, seen on rounds today.  Pt initially presented to the ED for complaints of Psychiatric Evaluation Currently, the patient is awake and rambling.  Pt remains in restraints.  However, he had a bath yesterday and ate well.  Physical Exam  BP (!) 151/86 (BP Location: Left Arm)    Pulse 79    Temp 98 F (36.7 C) (Oral)    Resp 19    SpO2 100%  Physical Exam General: awake Cardiac: rrr Lungs: cta b Psych: rambling words  ED Course / MDM  EKG:EKG Interpretation  Date/Time:  Friday March 10 2021 14:10:20 EST Ventricular Rate:  77 PR Interval:  136 QRS Duration: 96 QT Interval:  380 QTC Calculation: 430 R Axis:   72 Text Interpretation: Sinus rhythm with marked sinus arrhythmia Nonspecific T wave abnormality Abnormal ECG When compared with ECG of 07-Mar-2021 17:04, PREVIOUS ECG IS PRESENT since last tracing no significant change Confirmed by Rolan Bucco [phone]) on 03/10/2021 2:15:09 PM  I have reviewed the labs performed to date as well as medications administered while in observation.  Recent changes in the last 24 hours include pt has required sedation overnight for agitation.  Plan  Current plan is for awaiting placement.  I am worried about the continued use of 4 point restraints for several days.  I would love to see if we can get him moving around a little bit as he is going to develop sores and possibly PE/DVT. Awaiting psych and sheriff's ok.  Brandon Fuentes is under involuntary commitment.      Jacalyn Lefevre, MD 03/12/21 [phone]

## 2021-03-12 NOTE — ED Notes (Signed)
Much quieter since medicated no further loud voiced behavior or yelling out. Sheriff deputy remains at bedside and Mr Mirarchi remains in custody

## 2021-03-12 NOTE — ED Notes (Signed)
Patient continues to be agitated and uncooperative with directions while alert.  Patient resting comfortably at this time.

## 2021-03-13 MED ORDER — ACETAMINOPHEN 325 MG PO TABS
650.0000 mg | ORAL_TABLET | ORAL | Status: DC | PRN
  Administered 2021-03-13 – 2021-03-14 (×2): 650 mg via ORAL
  Filled 2021-03-13 (×3): qty 2

## 2021-03-13 MED ORDER — DIPHENHYDRAMINE HCL 50 MG/ML IJ SOLN: 50.0000 mg | Freq: Four times a day (QID) | INTRAMUSCULAR | Status: DC | PRN

## 2021-03-13 MED ORDER — LORAZEPAM 2 MG/ML IJ SOLN
2.0000 mg | Freq: Four times a day (QID) | INTRAMUSCULAR | Status: DC | PRN
  Administered 2021-03-14: 2 mg via INTRAMUSCULAR
  Filled 2021-03-13 (×2): qty 1

## 2021-03-13 MED ORDER — PALIPERIDONE ER 3 MG PO TB24
4.5000 mg | ORAL_TABLET | Freq: Every day | ORAL | Status: DC
  Administered 2021-03-13 – 2021-03-15 (×3): 4.5 mg via ORAL
  Filled 2021-03-13 (×3): qty 1

## 2021-03-13 NOTE — ED Notes (Signed)
Patient c/o 7/10 L arm pain. Notified Dr. Effie Shy. New order for 650mg  tylenol PO PRN ordered and given. Will continue to monitor.

## 2021-03-13 NOTE — ED Provider Notes (Signed)
Emergency Medicine Observation Re-evaluation Note  Brandon Fuentes is a 31 y.o. male, seen on rounds today at 0700.  Pt initially presented to the ED for complaints of Psychiatric Evaluation Currently, the patient is resting comfortably.  Physical Exam  BP (!) 151/86 (BP Location: Left Arm)    Pulse 79    Temp 98 F (36.7 C) (Oral)    Resp 19    SpO2 100%  Physical Exam General: NAD   ED Course / MDM  EKG:EKG Interpretation  Date/Time:  Friday March 10 2021 14:10:20 EST Ventricular Rate:  77 PR Interval:  136 QRS Duration: 96 QT Interval:  380 QTC Calculation: 430 R Axis:   72 Text Interpretation: Sinus rhythm with marked sinus arrhythmia Nonspecific T wave abnormality Abnormal ECG When compared with ECG of 07-Mar-2021 17:04, PREVIOUS ECG IS PRESENT since last tracing no significant change Confirmed by Rolan Bucco [phone]) on 03/10/2021 2:15:09 PM  I have reviewed the labs performed to date as well as medications administered while in observation.  Recent changes in the last 24 hours include no acute events reported.  Plan  Current plan is for pending psych placement. [name] waitlisted. Brandon Fuentes is under involuntary commitment.      Brandon Fines, MD 03/13/21 1046

## 2021-03-13 NOTE — ED Notes (Signed)
Pt ate 100% of his breakfast.

## 2021-03-13 NOTE — Consult Note (Signed)
°  Brandon Fuentes is a 31 year old male with past history of bipolar I severe, Catatonia, Neuroleptic Malignant Syndrome, Catatonia who presented to [facility] 03/07/21 from [facility] for psychiatric evaluation; was found to have hypokalemia. Per chart review patient presented to [facility] 02/19/21 for altered mental status with NA 168 and unresponsive, 01/05/21 [facility] for aggressive behavior from jail.   Patient presents alert and partially oriented to person Brandon Fuentes"), place [name]), location [name]). He is nonsensical, incoherent and inappropriate at this time.  He is fluctuating between loose associations, increased thought flow, and thought broadcasting.  He also was observed to be responding to internal stimuli, and displaying delay echolalia. His mood is elevated; speech is clear, pressured, tangential. Unable to complete full assessment due to current state; obvious  psychotic symptoms.  He is laughing inappropriately.  Patient remains in 4 point handcuffs with (2) officer at the bedside; no obvious distress. Chest rising and falling equally. Skin appears patent. Patient has urine bag that seems to have adequate output at this time.   Spoke with Brandon Fuentes, and advise of concerns to include need for long-term hospitalization.  Unfortunately [facility] are [facility] alternatives for [facility] including for [facility] In custody.  Will proceed with current plan for oral paliperidone tolerability, transition to long-acting [place].  Although patient would likely not stabilize prior to disposition to [name].    Plan:   -Continue to seek inpatient hospitalization for further  observation, stabilization, and treatment.   -[name] waitlist-non priority   -Medications:   -Benztropine (Cogentin) PO 0.5 mg 2 xs daily  PRN: tremor, EPS  -Diphenhydramine (Benadryl) IM 50 mg q6hr  PRN: agitation   -Lorazepam (Ativan) IM 2 mg q6hr PRN:  sedation,  agitation     -Oxcarbazepine (Trileptal) PO 300 mg BID:           mood stabilization  -Paliperidone (Invega) 4.5 mg PO daily:                       psychotic symptoms       -K+ remains low.  History of NMS.   -Potassium Chloride CR PO 40 mEq: hypokalemia  -Labs:        -CMP: K+ 3.2 (L), Albumin 3.2 (L), Total Protein 6.3 (L)   -Continue to monitor CMP, CBC w/diff, CK; labs have remained stable at this time.   -THIS PATIENT IS NOT TO RECEIVE ANY PRN OR IM ANTIPSYCHOTICS NOT CURRENTLY LISTED ON MAR GIVEN HISTORY OF (NMS) NEUROLEPTIC MALIGNANT SYNDROME.

## 2021-03-13 NOTE — ED Notes (Signed)
Pt refuses vitals  

## 2021-03-13 NOTE — ED Notes (Signed)
Patient becomes more agitated when attempting to touch him. Officer with patient 24/7.

## 2021-03-13 NOTE — BH Assessment (Signed)
[facility] Assessment Progress Note   At 08:16 this writer called [name] and spoke to [place].  She reports that pt is on their wait list as a non-priority referral.   Doylene Canning, MA Behavioral Health Coordinator [phone]

## 2021-03-14 ENCOUNTER — Encounter (HOSPITAL_COMMUNITY): Payer: Self-pay | Admitting: Emergency Medicine

## 2021-03-14 LAB — URINALYSIS, ROUTINE W REFLEX MICROSCOPIC
Bilirubin Urine: NEGATIVE
Glucose, UA: NEGATIVE mg/dL
Hgb urine dipstick: NEGATIVE
Ketones, ur: NEGATIVE mg/dL
Leukocytes,Ua: NEGATIVE
Nitrite: POSITIVE — AB
Protein, ur: NEGATIVE mg/dL
Specific Gravity, Urine: 1.014 (ref 1.005–1.030)
pH: 6 (ref 5.0–8.0)

## 2021-03-14 LAB — CBC WITH DIFFERENTIAL/PLATELET
Abs Immature Granulocytes: 0.02 10*3/uL (ref 0.00–0.07)
Basophils Absolute: 0 10*3/uL (ref 0.0–0.1)
Basophils Relative: 0 %
Eosinophils Absolute: 0.4 10*3/uL (ref 0.0–0.5)
Eosinophils Relative: 5 %
HCT: 43.4 % (ref 39.0–52.0)
Hemoglobin: 14.3 g/dL (ref 13.0–17.0)
Immature Granulocytes: 0 %
Lymphocytes Relative: 28 %
Lymphs Abs: 2.5 10*3/uL (ref 0.7–4.0)
MCH: 27 pg (ref 26.0–34.0)
MCHC: 32.9 g/dL (ref 30.0–36.0)
MCV: 82 fL (ref 80.0–100.0)
Monocytes Absolute: 0.6 10*3/uL (ref 0.1–1.0)
Monocytes Relative: 7 %
Neutro Abs: 5.3 10*3/uL (ref 1.7–7.7)
Neutrophils Relative %: 60 %
Platelets: 214 10*3/uL (ref 150–400)
RBC: 5.29 MIL/uL (ref 4.22–5.81)
RDW: 16 % — ABNORMAL HIGH (ref 11.5–15.5)
WBC: 8.9 10*3/uL (ref 4.0–10.5)
nRBC: 0 % (ref 0.0–0.2)

## 2021-03-14 MED ORDER — PALIPERIDONE PALMITATE ER 156 MG/ML IM SUSY
156.0000 mg | PREFILLED_SYRINGE | Freq: Once | INTRAMUSCULAR | Status: AC
  Administered 2021-03-14: 156 mg via INTRAMUSCULAR
  Filled 2021-03-14: qty 1

## 2021-03-14 NOTE — BH Assessment (Signed)
[place] Assessment Progress Note   At 08:19 this writer called [place] and spoke to [place].  He reports that pt is on their wait list as a non-priority referral.   Jalene Mullet, [place] Coordinator [phone]

## 2021-03-14 NOTE — ED Notes (Signed)
Pt awake, singing, denies wanting to kill me at this time

## 2021-03-14 NOTE — ED Notes (Signed)
Pt sleeping at this time. Respirations even and unlabored. Pt in 4 point restraints, +pmsc without skin breakdown. 2 officers at bedside.

## 2021-03-14 NOTE — ED Provider Notes (Signed)
Emergency Medicine Observation Re-evaluation Note  Brandon Fuentes is a 30 y.o. male, seen on rounds today.  Pt initially presented to the ED for complaints of Psychiatric Evaluation Currently, the patient is resting.  Physical Exam  BP (!) 150/103 (BP Location: Left Arm)    Pulse 71    Temp 97.9 F (36.6 C) (Oral)    Resp 20    SpO2 98%  Physical Exam General: no acute distress, in handcuffs Cardiac: RRR Lungs: clear Psych: delusional  ED Course / MDM  EKG:EKG Interpretation  Date/Time:  Friday March 10 2021 14:10:20 EST Ventricular Rate:  77 PR Interval:  136 QRS Duration: 96 QT Interval:  380 QTC Calculation: 430 R Axis:   72 Text Interpretation: Sinus rhythm with marked sinus arrhythmia Nonspecific T wave abnormality Abnormal ECG When compared with ECG of 07-Mar-2021 17:04, PREVIOUS ECG IS PRESENT since last tracing no significant change Confirmed by Rolan Bucco [phone]) on 03/10/2021 2:15:09 PM  I have reviewed the labs performed to date as well as medications administered while in observation.  Recent changes in the last 24 hours include prn ativan/bendaryl. Given invega this morning.  Plan  [facility] Brandon Fuentes is under involuntary commitment.      Pricilla Loveless, MD 03/14/21 1025

## 2021-03-15 DIAGNOSIS — Z9114 Patient's other noncompliance with medication regimen: Secondary | ICD-10-CM

## 2021-03-15 DIAGNOSIS — F319 Bipolar disorder, unspecified: Secondary | ICD-10-CM

## 2021-03-15 DIAGNOSIS — F29 Unspecified psychosis not due to a substance or known physiological condition: Secondary | ICD-10-CM

## 2021-03-15 LAB — COMPREHENSIVE METABOLIC PANEL
ALT: 35 U/L (ref 0–44)
AST: 30 U/L (ref 15–41)
Albumin: 3.3 g/dL — ABNORMAL LOW (ref 3.5–5.0)
Alkaline Phosphatase: 49 U/L (ref 38–126)
Anion gap: 7 (ref 5–15)
BUN: 8 mg/dL (ref 6–20)
CO2: 25 mmol/L (ref 22–32)
Calcium: 9.4 mg/dL (ref 8.9–10.3)
Chloride: 105 mmol/L (ref 98–111)
Creatinine, Ser: 0.73 mg/dL (ref 0.61–1.24)
GFR, Estimated: >60 mL/min (ref >60)
Glucose, Bld: 106 mg/dL — ABNORMAL HIGH (ref 70–99)
Potassium: 3.8 mmol/L (ref 3.5–5.1)
Sodium: 137 mmol/L (ref 135–145)
Total Bilirubin: 0.9 mg/dL (ref 0.3–1.2)
Total Protein: 6.9 g/dL (ref 6.5–8.1)

## 2021-03-15 LAB — LIPID PANEL
Cholesterol: 189 mg/dL (ref 0–200)
HDL: 35 mg/dL — ABNORMAL LOW (ref >40)
LDL Cholesterol: 134 mg/dL — ABNORMAL HIGH (ref 0–99)
Total CHOL/HDL Ratio: 5.4 RATIO
Triglycerides: 99 mg/dL (ref <150)
VLDL: 20 mg/dL (ref 0–40)

## 2021-03-15 LAB — CK: Total CK: 127 U/L (ref 49–397)

## 2021-03-15 NOTE — ED Provider Notes (Signed)
Emergency Medicine Observation Re-evaluation Note  Brandon Fuentes is a 31 y.o. male, seen on rounds today at 0700.  Pt initially presented to the ED for complaints of Psychiatric Evaluation Currently, the patient is resting comfortably.  Physical Exam  BP (!) 156/119 (BP Location: Left Arm)    Pulse 97    Temp 98.4 F (36.9 C) (Oral)    Resp 20    SpO2 98%  Physical Exam General: NAD   ED Course / MDM  EKG:EKG Interpretation  Date/Time:  Friday March 10 2021 14:10:20 EST Ventricular Rate:  77 PR Interval:  136 QRS Duration: 96 QT Interval:  380 QTC Calculation: 430 R Axis:   72 Text Interpretation: Sinus rhythm with marked sinus arrhythmia Nonspecific T wave abnormality Abnormal ECG When compared with ECG of 07-Mar-2021 17:04, PREVIOUS ECG IS PRESENT since last tracing no significant change Confirmed by Rolan Bucco [phone]) on 03/10/2021 2:15:09 PM  I have reviewed the labs performed to date as well as medications administered while in observation.  Recent changes in the last 24 hours include no acute events reported.  Plan  Current plan is for placement. Dvon Jiles is under involuntary commitment.      [name], MD 03/15/21 [phone]

## 2021-03-15 NOTE — BH Assessment (Signed)
[facility] Assessment Progress Note   At 08:43 this writer called [name] and spoke to [place].  She reports that pt is on their wait list as a non-priority referral.   Doylene Canning, MA Behavioral Health Coordinator [phone]

## 2021-03-15 NOTE — Progress Notes (Signed)
03/15/2021  0815  Labs drawn and sent to main lab. Two light green tubes sent.

## 2021-03-15 NOTE — Consult Note (Signed)
[facility] ED Discharge  03/15/2021 11:17 AM Brandon [facility]  MRN:  [id number]  Method of visit?: Face to Face   Principal Problem: <principal problem not specified> Discharge Diagnoses: Active Problems:   * No active hospital problems. *   Subjective: [facility] is a 31 year old male with past psychiatric history significant for bipolar 1 disorder and schizoaffective disorder, who presented to [facility] emergency department from [facility] after refusing to eat, drink, and or take his medications.  Patient asked if he was behaving bizarrely before being admitted to [facility] ED to which the patient replied "no, not at all."   Patient continues to remain with nonsensical speech, rambling, talking incessantly, and unable to complete full sentences.  Patient reports a past history of hospitalization back in December 2022.  Chart review shows patient was admitted in January 2023 for hypernatremia.  Patient denies depressive symptoms or anxiety.  Patient denies suicidal or homicidal ideations.  He further denies auditory or visual hallucinations.  Patient endorses decreased appetite.  He reports no issues with his sleep.  His mood remains elevated.  Unable to complete full assessment due to current psychotic state, and four-point handcuffs which remain in place for his safety and safety of others.  Patient has a history of being very volatile, combative, and when psychotic has been known to smear bodily fluids.    Total Time spent with patient: 30 minutes  Past Psychiatric History:   Past Medical History:  Past Medical History:  Diagnosis Date   Bipolar 1 disorder ([place])    History of neuroleptic malignant syndrome    History reviewed. No pertinent surgical history. Family History:  Family History  Problem Relation Age of Onset   Diabetes Mother    Family Psychiatric  History: Unable to assess Social History:  Social History   Substance and Sexual Activity  Alcohol Use  No     Social History   Substance and Sexual Activity  Drug Use No    Social History   Socioeconomic History   Marital status: Single    Spouse name: Not on file   Number of children: Not on file   Years of education: Not on file   Highest education level: Not on file  Occupational History   Not on file  Tobacco Use   Smoking status: Never   Smokeless tobacco: Never  Vaping Use   Vaping Use: Never used  Substance and Sexual Activity   Alcohol use: No   Drug use: No   Sexual activity: Yes    Birth control/protection: None  Other Topics Concern   Not on file  Social History Narrative   Not on file   Social Determinants of Health   Financial Resource Strain: Not on file  Food Insecurity: Not on file  Transportation Needs: Not on file  Physical Activity: Not on file  Stress: Not on file  Social Connections: Not on file    Tobacco Cessation:  N/A, patient does not currently use tobacco products  Current Medications: Current Facility-Administered Medications  Medication Dose Route Frequency Provider Last Rate Last Admin   acetaminophen (TYLENOL) tablet 650 mg  650 mg Oral Q4H PRN Daleen Bo, MD   650 mg at 03/14/21 0555   bacitracin ointment   Topical BID Malvin Johns, MD   1 application at [id number]   benztropine (COGENTIN) tablet 0.5 mg  0.5 mg Oral BID PRN Suella Broad, FNP       diphenhydrAMINE (BENADRYL) injection  50 mg  50 mg Intramuscular Q6H PRN Starkes-Perry, Gayland Curry, FNP       LORazepam (ATIVAN) injection 2 mg  2 mg Intramuscular Q6H PRN Suella Broad, FNP   2 mg at 03/14/21 0555   Oxcarbazepine (TRILEPTAL) tablet 300 mg  300 mg Oral BID Suella Broad, FNP   300 mg at 03/15/21 0950   paliperidone (INVEGA) 24 hr tablet 4.5 mg  4.5 mg Oral Daily Suella Broad, FNP   4.5 mg at 03/15/21 1009   potassium chloride SA (KLOR-CON M) CR tablet 40 mEq  40 mEq Oral Once Jeanell Sparrow, DO       Current Outpatient  Medications  Medication Sig Dispense Refill   OXcarbazepine (TRILEPTAL) 150 MG tablet Take 1 tablet (150 mg total) by mouth 2 (two) times daily.     feeding supplement, ENSURE ENLIVE, (ENSURE ENLIVE) LIQD Take 237 mLs by mouth 2 (two) times daily between meals. (Patient not taking: Reported on 03/07/2021) 237 mL 12   hydrOXYzine (ATARAX) 50 MG tablet Take 1 tablet (50 mg total) by mouth every 6 (six) hours as needed (agitation). (Patient not taking: Reported on 03/07/2021) 30 tablet 0   PTA Medications: (Not in a hospital admission)   Musculoskeletal: Strength & Muscle Tone: within normal limits Gait & Station: normal Patient leans: N/A  Psychiatric Specialty Exam:  Presentation  General Appearance: Disheveled; Bizarre  Eye Contact:Minimal; Fleeting  Speech:Pressured  Speech Volume:Increased  Handedness:Right   Mood and Affect  Mood:Euphoric  Affect:Full Range   Thought Process  Thought Processes:Disorganized  Descriptions of Associations:Loose  Orientation:Partial  Thought Content:Illogical; Scattered; Tangential  History of Schizophrenia/Schizoaffective disorder:Yes  Duration of Psychotic Symptoms:Greater than six months  Hallucinations:Hallucinations: None  Ideas of Reference:Percusatory  Suicidal Thoughts:Suicidal Thoughts: No  Homicidal Thoughts:Homicidal Thoughts: No   Sensorium  Memory:Immediate Fair; Recent Fair; Remote Fair  Judgment:Fair  Insight:Fair   Executive Functions  Concentration:Fair  Attention Span:Fair  [place]   Psychomotor Activity  Psychomotor Activity:Psychomotor Activity: Normal   Assets  Assets:Resilience; Physical Health; Communication Skills; Desire for Improvement   Sleep  Sleep:Sleep: Good    Physical Exam: Physical Exam Vitals and nursing note reviewed.  Constitutional:      Appearance: Normal appearance. He is normal weight.  HENT:     Head:  Normocephalic.  Eyes:     Pupils: Pupils are equal, round, and reactive to light.  Skin:    Capillary Refill: Capillary refill takes less than 2 seconds.  Neurological:     General: No focal deficit present.     Mental Status: He is alert. Mental status is at baseline. He is disoriented.  Psychiatric:        Attention and Perception: He is inattentive.        Mood and Affect: Mood is elated. Affect is labile.        Speech: Speech is rapid and pressured and tangential.        Behavior: Behavior is hyperactive.        Thought Content: Thought content is delusional.        Cognition and Memory: Cognition is impaired. Memory is impaired.        Judgment: Judgment is inappropriate.   Review of Systems  Psychiatric/Behavioral:  Negative for depression, hallucinations, memory loss, substance abuse and suicidal ideas. The patient is nervous/anxious and has insomnia.   All other systems reviewed and are negative. Blood pressure (!) 168/117, pulse (!) 127, temperature  97.9 F (36.6 C), temperature source Oral, resp. rate 20, SpO2 99 %. There is no height or weight on file to calculate BMI.   Demographic Factors:  Male, Adolescent or young adult, Low socioeconomic status, and Living alone  Loss Factors: Decrease in vocational status, Loss of significant relationship, Decline in physical health, and Financial problems/change in socioeconomic status  Historical Factors: Impulsivity  Risk Reduction Factors:   Positive therapeutic relationship  Continued Clinical Symptoms:  Schizophrenia:   Less than 12 years old More than one psychiatric diagnosis Currently Psychotic Unstable or Poor Therapeutic Relationship Previous Psychiatric Diagnoses and Treatments  Cognitive Features That Contribute To Risk:  Polarized thinking    Suicide Risk:  Minimal: No identifiable suicidal ideation.  Patients presenting with no risk factors but with morbid ruminations; may be classified as minimal risk  based on the severity of the depressive symptoms   Plan Of Care/Follow-up recommendations:   During his 7-day admission in the emergency room patient did display aggressive behaviors, erratic behaviors, nonsensical speech, ideas of grandeur, psychosis, and observed responding to internal stimuli.  Due to patient's history of developing NMS, he was able to start paliperidone and thereby demonstrated or tolerability prior to administering long-acting injectable.  Routine and baseline labs were drawn, and daily evaluations were performed.  Patient has chronic schizoaffective disorder, that will require long-term hospitalization for stabilization in an ongoing monitoring and awareness for NMS.  He will require long-term hospitalization for stabilization of his psychiatric conditions.  Unfortunately due to patient being in [place] custody and as a prisoner, he has been denied to multiple acute inpatient psychiatric hospitalization as he is on got back 2 sheriffs.  Patient remained in four-point handcuffs for the duration of his stay as a result of being in custody. He continues to have great need for long-term hospitalization and ongoing management and a state facility, unfortunately he will not be able to receive those services and a emergency room.  In conjunction with attending supervisor Dr. Ma Rings and emergency room physicians, patient will be discharged and return back to [facility] at this time.  Patient was discharged prior to placing information in after visit summary.  This nurse practitioner did contact Officer DIehl, to provide information for medication management and accurate follow-up to include next dose of Invega [place] 234 mg IM to be administered on February 21.  He is also advised that he can discontinue oral Invega at this time, as he no longer needs it.  Also recommend continuing benzatropine 0.5 mg p.o. twice daily for EPS, and Trileptal 300 mg p.o. twice daily for  mood stabilization.  Patient will require long acting injectables for stabilization of psychiatric condition.  In the event he were to develop NMS, will require treatment with Ativan, dantrolene, brocrimptine, and return to local area Hospital for immediate management of his current symptoms.  Patient's behaviors have continued to wax and wane in addition to his altered mental status.  Although he does not display any rigidity, cogwheeling, and or stiffness.  His vital signs for the past 7 days have been stable, in addition to his labs being stable while demonstrating or tolerability on paliperidone.  Baseline labs were again obtained today prior to discharge, which continue to remain stable.    Activity:  Routine testing as tolerated. Diet:  Routine diet as tolerated Tests:  Routine A1c, lipid, cbc, cmp, tsh, and CK labs as directed with outpatient psychiatrist Other:  Will receive second [place] 234mg  IM  in a single dose on 03/21/2021. May continue oral Trileptal and cogentin as tolerated.   Disposition: Discharge  Suella Broad, FNP 03/15/2021, 11:17 AM

## 2021-03-15 NOTE — BH Assessment (Signed)
[facility] Assessment Progress Note   Per Sheran Fava, NP, this pt no longer requires Emergency Department services.  Pt is an inmate at [facility] jail under the custody of [place] department.  Pt presents under IVC initiated by EDP Wynona Dove, DO, which expired on 03/14/2021.  [name] attempted to place pt under a new IVC on that day, however, sheriffs refused to serve it due to custodial status and so, pt became a voluntary patient, though under custody.  Pt is cleared of emergency psychiatric care needs, however, sheriffs will be encouraged to provide for his psychiatry needs at the jail, and to continue pursue placement at [facility].  At 11:34 this writer called [facility] and spoke to [facility] in intake.  I informed her that pt is being discharged from [facility] but that we still recommend that jail staff follow through with [facility] referral.  No referrals have been entered into discharge instructions; jail will provide for pt's ongoing psychiatry needs at this time.  EDP Dene Gentry, MD and pt's nurse, Tilda Franco, have been notified.  Jalene Mullet, [place] Triage Specialist [phone]

## 2022-07-16 IMAGING — CT CT ABD-PELV W/O CM
2 of 4 series · 16 of 46 positions shown, 18 images · non-contrast
Comparison: CT dated 04/26/2017.

CLINICAL DATA: Abdominal pain.



[Series 3: ap without · axial · non-contrast · 0.98mm/px · z∈[+941,+1401]mm · 13 of 106 slices shown, 15 images]
[im 7/106  soft-tissue]
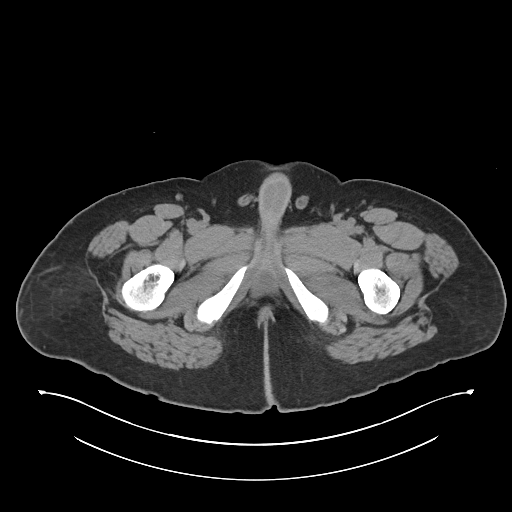
[im 7/106  bone]
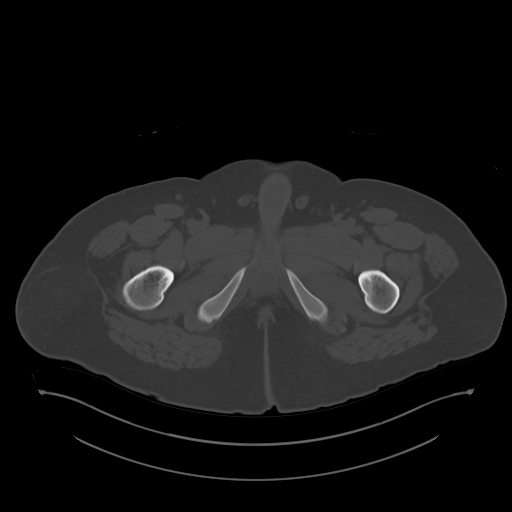
[im 13/106  soft-tissue]
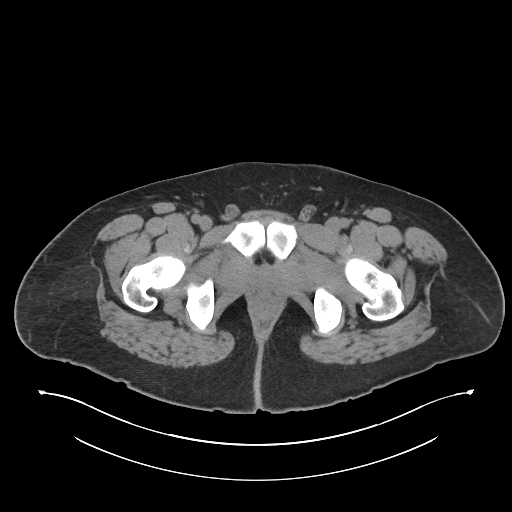
[im 25/106  soft-tissue]
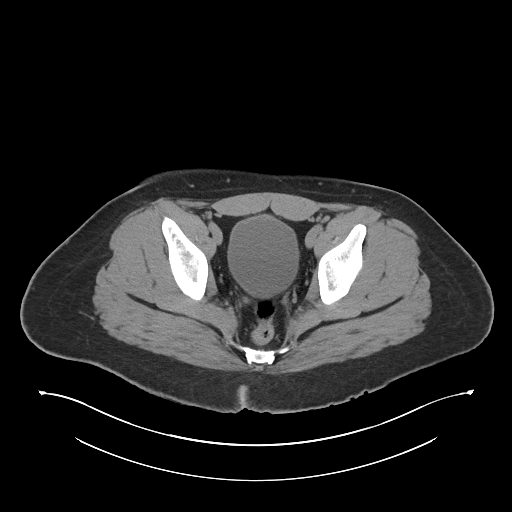
[im 31/106  soft-tissue]
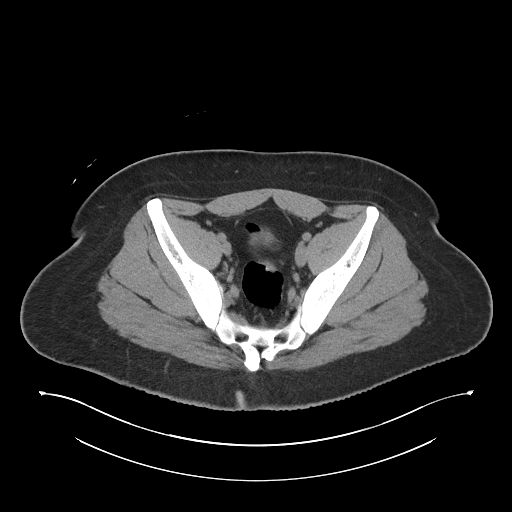
[im 38/106  soft-tissue]
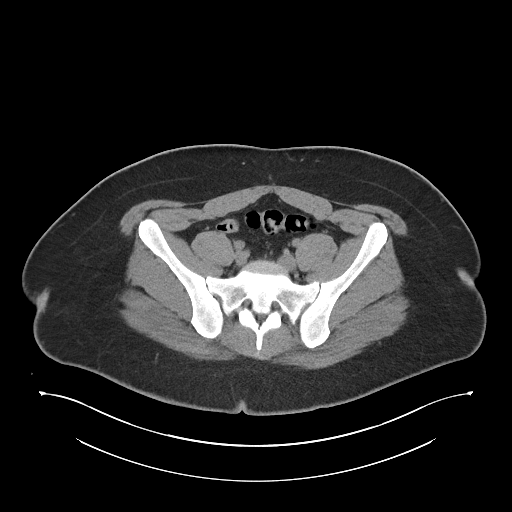
[im 44/106  soft-tissue]
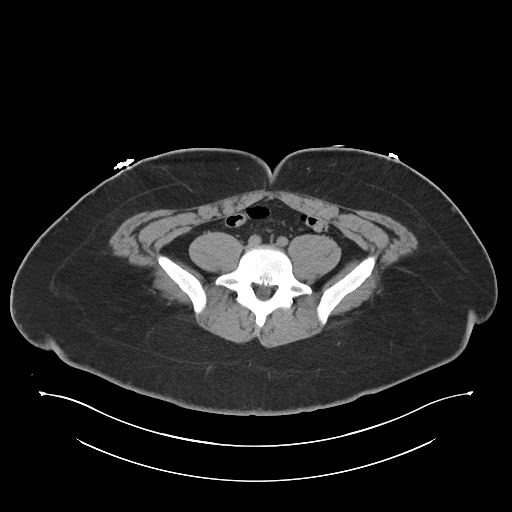
[im 56/106  soft-tissue]
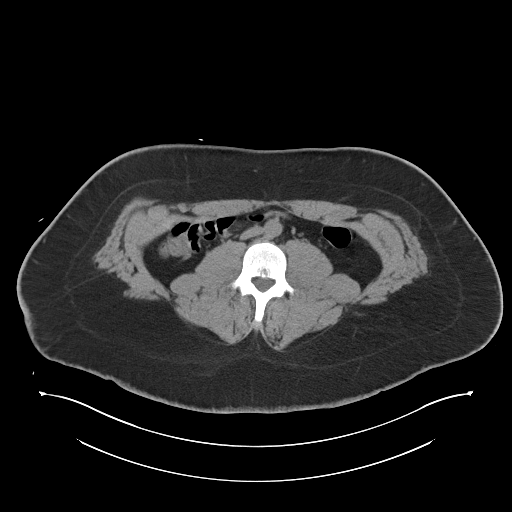
[im 62/106  soft-tissue]
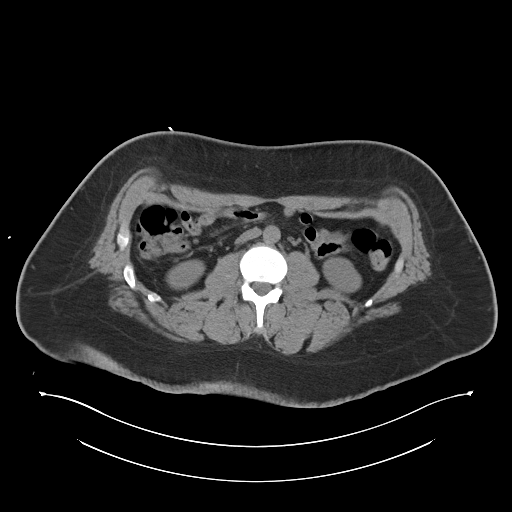
[im 68/106  soft-tissue]
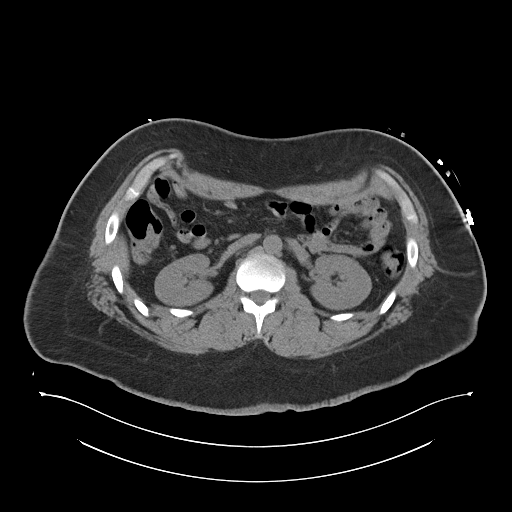
[im 68/106  bone]
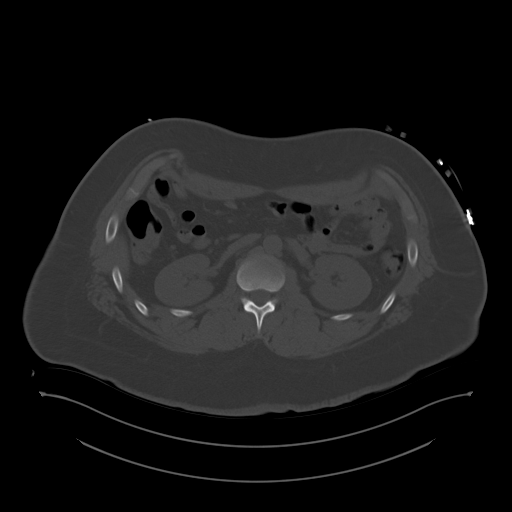
[im 75/106  soft-tissue]
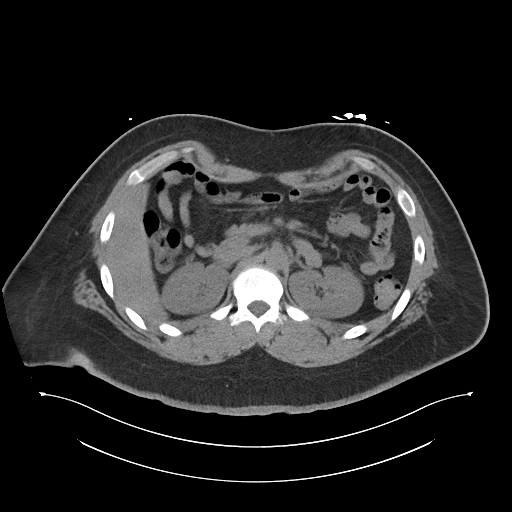
[im 81/106  soft-tissue]
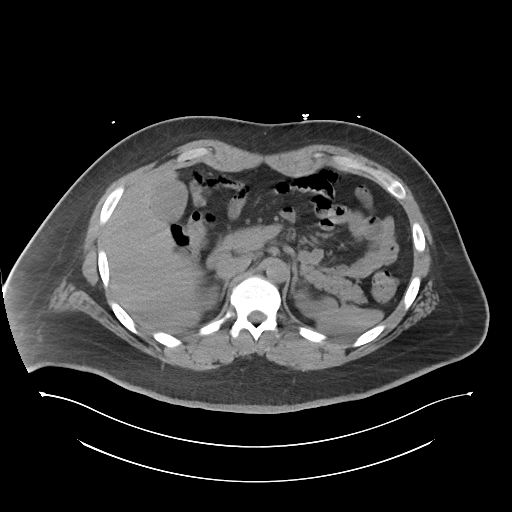
[im 93/106  soft-tissue]
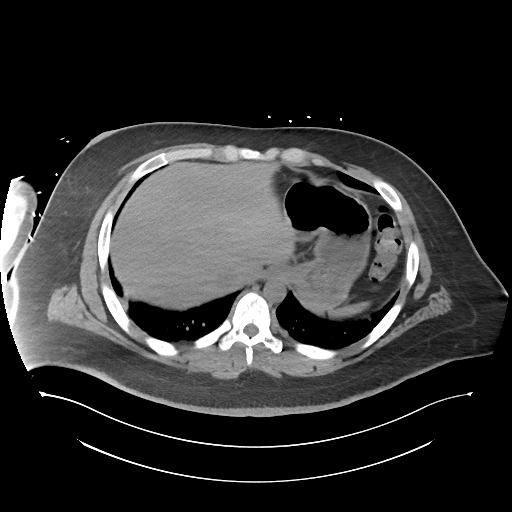
[im 99/106  soft-tissue]
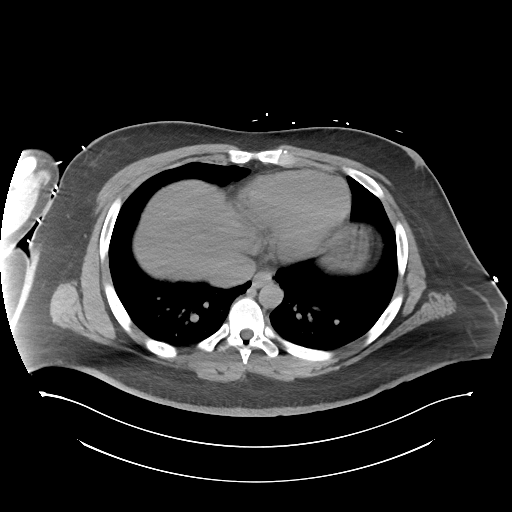

[Series 6: cor · coronal · 1.04mm/px · 3 of 104 slices shown]
[im 35/104  soft-tissue]
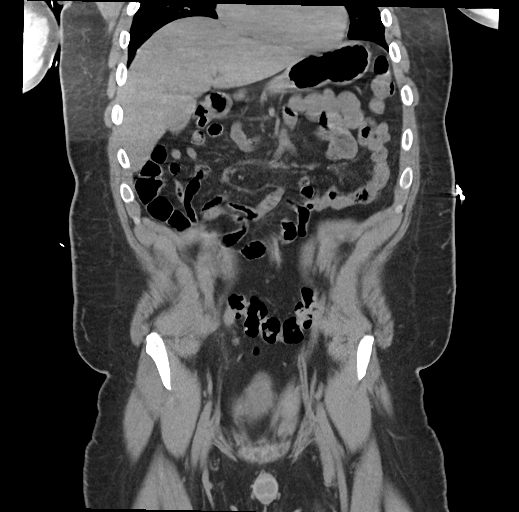
[im 46/104  soft-tissue]
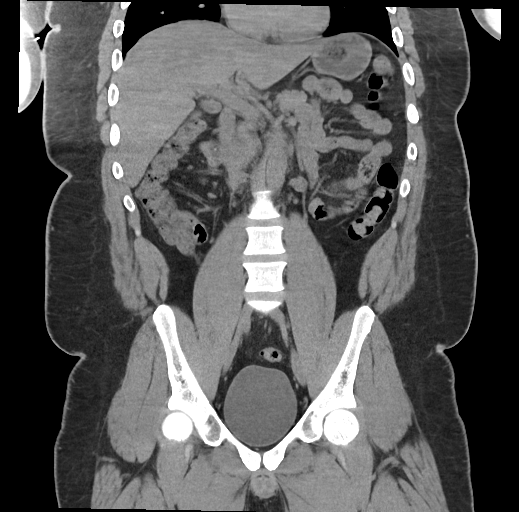
[im 58/104  soft-tissue]
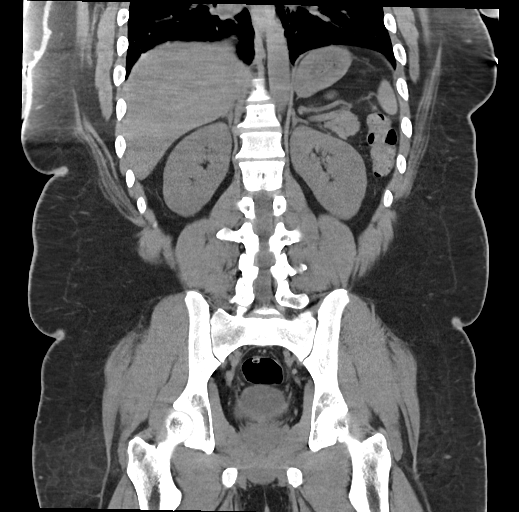

[16 of 46 positions shown; findings below may reference images not displayed]

FINDINGS: Evaluation of this exam is limited in the absence of intravenous
contrast.

Lower chest: Minimal bibasilar atelectasis. Small crescentic lucency
adjacent to the descending thoracic aorta may represent small amount
of pneumomediastinum or trapped air from prior pneumomediastinum.

No intra-abdominal free air or free fluid.

Hepatobiliary: No focal liver abnormality is seen. No gallstones,
gallbladder wall thickening, or biliary dilatation.

Pancreas: Unremarkable. No pancreatic ductal dilatation or
surrounding inflammatory changes.

Spleen: Normal in size without focal abnormality.

Adrenals/Urinary Tract: The adrenal glands are unremarkable. The
kidneys, visualized ureters, and urinary bladder appear
unremarkable.

Stomach/Bowel: Moderate stool throughout the colon. No bowel
obstruction or active inflammation. The appendix is normal.

Vascular/Lymphatic: The abdominal aorta and IVC unremarkable on this
noncontrast CT. No portal venous gas. There is no adenopathy.

Reproductive: The prostate and seminal vesicles are grossly
unremarkable. No pelvic mass.

Other: None

Musculoskeletal: No acute or significant osseous findings.
IMPRESSION: 1. No acute intra-abdominal or pelvic pathology.
2. Moderate colonic stool burden. No bowel obstruction. Normal
appendix.
3. Small crescentic lucency adjacent to the descending thoracic
aorta may represent small amount of pneumomediastinum or trapped air
from prior pneumomediastinum.

## 2023-02-27 DIAGNOSIS — I10 Essential (primary) hypertension: Secondary | ICD-10-CM | POA: Diagnosis not present

## 2023-02-27 DIAGNOSIS — K219 Gastro-esophageal reflux disease without esophagitis: Secondary | ICD-10-CM | POA: Diagnosis not present

## 2023-07-24 DIAGNOSIS — I10 Essential (primary) hypertension: Secondary | ICD-10-CM | POA: Diagnosis not present

## 2023-07-24 DIAGNOSIS — K219 Gastro-esophageal reflux disease without esophagitis: Secondary | ICD-10-CM | POA: Diagnosis not present

## 2023-08-05 DIAGNOSIS — Z79899 Other long term (current) drug therapy: Secondary | ICD-10-CM | POA: Diagnosis not present

## 2023-08-13 DIAGNOSIS — R5381 Other malaise: Secondary | ICD-10-CM | POA: Diagnosis not present

## 2023-08-19 DIAGNOSIS — I10 Essential (primary) hypertension: Secondary | ICD-10-CM | POA: Diagnosis not present

## 2023-08-19 DIAGNOSIS — G4700 Insomnia, unspecified: Secondary | ICD-10-CM | POA: Diagnosis not present

## 2023-08-19 DIAGNOSIS — G4733 Obstructive sleep apnea (adult) (pediatric): Secondary | ICD-10-CM | POA: Diagnosis not present

## 2023-08-19 DIAGNOSIS — K219 Gastro-esophageal reflux disease without esophagitis: Secondary | ICD-10-CM | POA: Diagnosis not present

## 2023-08-19 DIAGNOSIS — Z91148 Patient's other noncompliance with medication regimen for other reason: Secondary | ICD-10-CM | POA: Diagnosis not present

## 2023-08-19 DIAGNOSIS — Z79899 Other long term (current) drug therapy: Secondary | ICD-10-CM | POA: Diagnosis not present

## 2023-08-19 DIAGNOSIS — G210 Malignant neuroleptic syndrome: Secondary | ICD-10-CM | POA: Diagnosis not present

## 2023-08-19 DIAGNOSIS — Z781 Physical restraint status: Secondary | ICD-10-CM | POA: Diagnosis not present

## 2023-08-19 DIAGNOSIS — Z87891 Personal history of nicotine dependence: Secondary | ICD-10-CM | POA: Diagnosis not present

## 2023-08-19 DIAGNOSIS — J45909 Unspecified asthma, uncomplicated: Secondary | ICD-10-CM | POA: Diagnosis not present

## 2023-08-30 DIAGNOSIS — R451 Restlessness and agitation: Secondary | ICD-10-CM | POA: Diagnosis not present

## 2023-08-30 DIAGNOSIS — E7220 Disorder of urea cycle metabolism, unspecified: Secondary | ICD-10-CM | POA: Diagnosis not present

## 2023-08-30 DIAGNOSIS — T426X5A Adverse effect of other antiepileptic and sedative-hypnotic drugs, initial encounter: Secondary | ICD-10-CM | POA: Diagnosis not present

## 2023-08-30 DIAGNOSIS — Z560 Unemployment, unspecified: Secondary | ICD-10-CM | POA: Diagnosis not present

## 2023-08-30 DIAGNOSIS — Z87891 Personal history of nicotine dependence: Secondary | ICD-10-CM | POA: Diagnosis not present

## 2023-08-30 DIAGNOSIS — D721 Eosinophilia, unspecified: Secondary | ICD-10-CM | POA: Diagnosis not present

## 2023-08-30 DIAGNOSIS — R9431 Abnormal electrocardiogram [ECG] [EKG]: Secondary | ICD-10-CM | POA: Diagnosis not present

## 2023-08-30 DIAGNOSIS — G4700 Insomnia, unspecified: Secondary | ICD-10-CM | POA: Diagnosis not present

## 2023-08-30 DIAGNOSIS — R001 Bradycardia, unspecified: Secondary | ICD-10-CM | POA: Diagnosis not present

## 2023-08-30 DIAGNOSIS — E860 Dehydration: Secondary | ICD-10-CM | POA: Diagnosis not present

## 2023-08-30 DIAGNOSIS — Z888 Allergy status to other drugs, medicaments and biological substances status: Secondary | ICD-10-CM | POA: Diagnosis not present

## 2023-08-30 DIAGNOSIS — Z781 Physical restraint status: Secondary | ICD-10-CM | POA: Diagnosis not present

## 2023-08-30 DIAGNOSIS — I10 Essential (primary) hypertension: Secondary | ICD-10-CM | POA: Diagnosis not present

## 2023-08-30 DIAGNOSIS — Z638 Other specified problems related to primary support group: Secondary | ICD-10-CM | POA: Diagnosis not present

## 2023-08-30 DIAGNOSIS — Z91148 Patient's other noncompliance with medication regimen for other reason: Secondary | ICD-10-CM | POA: Diagnosis not present

## 2023-08-30 DIAGNOSIS — R631 Polydipsia: Secondary | ICD-10-CM | POA: Diagnosis not present

## 2023-08-30 DIAGNOSIS — R748 Abnormal levels of other serum enzymes: Secondary | ICD-10-CM | POA: Diagnosis not present

## 2023-08-30 DIAGNOSIS — K219 Gastro-esophageal reflux disease without esophagitis: Secondary | ICD-10-CM | POA: Diagnosis not present

## 2023-08-30 DIAGNOSIS — E870 Hyperosmolality and hypernatremia: Secondary | ICD-10-CM | POA: Diagnosis not present

## 2023-08-30 DIAGNOSIS — Z0289 Encounter for other administrative examinations: Secondary | ICD-10-CM | POA: Diagnosis not present

## 2023-08-30 DIAGNOSIS — G9340 Encephalopathy, unspecified: Secondary | ICD-10-CM | POA: Diagnosis not present

## 2023-08-30 DIAGNOSIS — Z79899 Other long term (current) drug therapy: Secondary | ICD-10-CM | POA: Diagnosis not present

## 2023-08-30 DIAGNOSIS — N179 Acute kidney failure, unspecified: Secondary | ICD-10-CM | POA: Diagnosis not present

## 2023-09-03 DIAGNOSIS — G9340 Encephalopathy, unspecified: Secondary | ICD-10-CM | POA: Diagnosis not present

## 2023-09-04 DIAGNOSIS — G9340 Encephalopathy, unspecified: Secondary | ICD-10-CM | POA: Diagnosis not present

## 2023-09-24 DIAGNOSIS — K219 Gastro-esophageal reflux disease without esophagitis: Secondary | ICD-10-CM | POA: Diagnosis not present

## 2023-09-24 DIAGNOSIS — R600 Localized edema: Secondary | ICD-10-CM | POA: Diagnosis not present

## 2023-09-24 DIAGNOSIS — I10 Essential (primary) hypertension: Secondary | ICD-10-CM | POA: Diagnosis not present

## 2023-09-24 DIAGNOSIS — R944 Abnormal results of kidney function studies: Secondary | ICD-10-CM | POA: Diagnosis not present

## 2023-09-27 DIAGNOSIS — R600 Localized edema: Secondary | ICD-10-CM | POA: Diagnosis not present

## 2023-09-27 DIAGNOSIS — K219 Gastro-esophageal reflux disease without esophagitis: Secondary | ICD-10-CM | POA: Diagnosis not present

## 2023-09-28 DIAGNOSIS — M7989 Other specified soft tissue disorders: Secondary | ICD-10-CM | POA: Diagnosis not present

## 2023-10-05 DIAGNOSIS — R2243 Localized swelling, mass and lump, lower limb, bilateral: Secondary | ICD-10-CM | POA: Diagnosis not present

## 2023-10-05 DIAGNOSIS — I82402 Acute embolism and thrombosis of unspecified deep veins of left lower extremity: Secondary | ICD-10-CM | POA: Diagnosis not present

## 2023-10-05 DIAGNOSIS — I10 Essential (primary) hypertension: Secondary | ICD-10-CM | POA: Diagnosis not present

## 2023-10-06 DIAGNOSIS — I10 Essential (primary) hypertension: Secondary | ICD-10-CM | POA: Diagnosis not present

## 2023-10-06 DIAGNOSIS — I82402 Acute embolism and thrombosis of unspecified deep veins of left lower extremity: Secondary | ICD-10-CM | POA: Diagnosis not present

## 2023-10-06 DIAGNOSIS — R2243 Localized swelling, mass and lump, lower limb, bilateral: Secondary | ICD-10-CM | POA: Diagnosis not present
